# Patient Record
Sex: Female | Born: 1995 | Race: White | Hispanic: No | Marital: Single | Smoking: Former smoker
Health system: Southern US, Community
[De-identification: ages and names within clinical notes are randomized; demographics above are authoritative.]

## PROBLEM LIST (undated history)

## (undated) DIAGNOSIS — F32A Depression, unspecified: Secondary | ICD-10-CM

## (undated) DIAGNOSIS — R569 Unspecified convulsions: Secondary | ICD-10-CM

## (undated) DIAGNOSIS — F909 Attention-deficit hyperactivity disorder, unspecified type: Secondary | ICD-10-CM

## (undated) DIAGNOSIS — F39 Unspecified mood [affective] disorder: Secondary | ICD-10-CM

## (undated) DIAGNOSIS — F419 Anxiety disorder, unspecified: Secondary | ICD-10-CM

## (undated) DIAGNOSIS — G40909 Epilepsy, unspecified, not intractable, without status epilepticus: Secondary | ICD-10-CM

## (undated) DIAGNOSIS — G43909 Migraine, unspecified, not intractable, without status migrainosus: Secondary | ICD-10-CM

## (undated) DIAGNOSIS — F329 Major depressive disorder, single episode, unspecified: Secondary | ICD-10-CM

## (undated) HISTORY — PX: NO PAST SURGERIES: SHX2092

---
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
## 2015-04-19 ENCOUNTER — Other Ambulatory Visit: Payer: Self-pay | Admitting: Pediatrics

## 2015-04-19 DIAGNOSIS — R102 Pelvic and perineal pain: Secondary | ICD-10-CM

## 2015-04-20 ENCOUNTER — Ambulatory Visit
Admission: RE | Admit: 2015-04-20 | Discharge: 2015-04-20 | Disposition: A | Discharge status: Home / Self Care | Payer: Medicaid Other | Source: Ambulatory Visit | Attending: Pediatrics | Admitting: Pediatrics

## 2015-04-20 DIAGNOSIS — R102 Pelvic and perineal pain: Secondary | ICD-10-CM

## 2015-04-21 ENCOUNTER — Ambulatory Visit: Payer: Self-pay

## 2015-12-13 ENCOUNTER — Ambulatory Visit: Admission: EM | Admit: 2015-12-13 | Discharge: 2015-12-13 | Discharge status: Absent without leave | Payer: Self-pay

## 2018-11-19 ENCOUNTER — Other Ambulatory Visit: Payer: Self-pay

## 2018-11-19 ENCOUNTER — Emergency Department
Admission: EM | Admit: 2018-11-19 | Discharge: 2018-11-19 | Disposition: A | Discharge status: Home / Self Care | Payer: Managed Care, Other (non HMO) | Attending: Emergency Medicine | Admitting: Emergency Medicine

## 2018-11-19 ENCOUNTER — Encounter: Payer: Self-pay | Admitting: Emergency Medicine

## 2018-11-19 DIAGNOSIS — J029 Acute pharyngitis, unspecified: Secondary | ICD-10-CM | POA: Diagnosis present

## 2018-11-19 DIAGNOSIS — J039 Acute tonsillitis, unspecified: Secondary | ICD-10-CM | POA: Diagnosis not present

## 2018-11-19 HISTORY — DX: Attention-deficit hyperactivity disorder, unspecified type: F90.9

## 2018-11-19 HISTORY — DX: Unspecified convulsions: R56.9

## 2018-11-19 HISTORY — DX: Unspecified mood (affective) disorder: F39

## 2018-11-19 HISTORY — DX: Migraine, unspecified, not intractable, without status migrainosus: G43.909

## 2018-11-19 LAB — CBC WITH DIFFERENTIAL/PLATELET
Abs Immature Granulocytes: 0.17 10*3/uL — ABNORMAL HIGH (ref 0.00–0.07)
BASOS PCT: 0 %
Basophils Absolute: 0.1 10*3/uL (ref 0.0–0.1)
EOS ABS: 0.1 10*3/uL (ref 0.0–0.5)
Eosinophils Relative: 1 %
HCT: 41.8 % (ref 36.0–46.0)
Hemoglobin: 13.9 g/dL (ref 12.0–15.0)
Immature Granulocytes: 1 %
Lymphocytes Relative: 9 %
Lymphs Abs: 1.7 10*3/uL (ref 0.7–4.0)
MCH: 28.7 pg (ref 26.0–34.0)
MCHC: 33.3 g/dL (ref 30.0–36.0)
MCV: 86.2 fL (ref 80.0–100.0)
Monocytes Absolute: 1.6 10*3/uL — ABNORMAL HIGH (ref 0.1–1.0)
Monocytes Relative: 8 %
Neutro Abs: 15.6 10*3/uL — ABNORMAL HIGH (ref 1.7–7.7)
Neutrophils Relative %: 81 %
Platelets: 225 10*3/uL (ref 150–400)
RBC: 4.85 MIL/uL (ref 3.87–5.11)
RDW: 12.7 % (ref 11.5–15.5)
WBC: 19.4 10*3/uL — ABNORMAL HIGH (ref 4.0–10.5)
nRBC: 0 % (ref 0.0–0.2)

## 2018-11-19 LAB — BASIC METABOLIC PANEL
Anion gap: 9 (ref 5–15)
BUN: 9 mg/dL (ref 6–20)
CALCIUM: 9.1 mg/dL (ref 8.9–10.3)
CO2: 25 mmol/L (ref 22–32)
Chloride: 101 mmol/L (ref 98–111)
Creatinine, Ser: 0.75 mg/dL (ref 0.44–1.00)
GFR calc Af Amer: >60 mL/min (ref >60)
GFR calc non Af Amer: >60 mL/min (ref >60)
Glucose, Bld: 104 mg/dL — ABNORMAL HIGH (ref 70–99)
Potassium: 3.6 mmol/L (ref 3.5–5.1)
Sodium: 135 mmol/L (ref 135–145)

## 2018-11-19 LAB — GROUP A STREP BY PCR: Group A Strep by PCR: NOT DETECTED

## 2018-11-19 MED ORDER — PREDNISONE 10 MG PO TABS: ORAL_TABLET | ORAL | 0 refills | Status: DC

## 2018-11-19 MED ORDER — AMOXICILLIN-POT CLAVULANATE 500-125 MG PO TABS: 1.0000 | ORAL_TABLET | Freq: Three times a day (TID) | ORAL | 0 refills | Status: DC

## 2018-11-19 MED ORDER — DEXAMETHASONE SODIUM PHOSPHATE 4 MG/ML IJ SOLN
10.0000 mg | Freq: Once | INTRAMUSCULAR | Status: AC
  Administered 2018-11-19: 10 mg via INTRAVENOUS
  Filled 2018-11-19: qty 3

## 2018-11-19 MED ORDER — SODIUM CHLORIDE 0.9 % IV BOLUS
1000.0000 mL | Freq: Once | INTRAVENOUS | Status: AC
  Administered 2018-11-19: 1000 mL via INTRAVENOUS

## 2018-11-19 MED ORDER — SODIUM CHLORIDE 0.9 % IV SOLN
3.0000 g | Freq: Four times a day (QID) | INTRAVENOUS | Status: DC
  Administered 2018-11-19: 3 g via INTRAVENOUS
  Filled 2018-11-19: qty 3

## 2018-11-19 NOTE — ED Provider Notes (Signed)
[facility] Emergency Department Provider Note   ____________________________________________   None    (approximate)  I have reviewed the triage vital signs and the nursing notes.   HISTORY  Chief Complaint Sore Throat   HPI Sarah Gomez is a 22 y.o. female presents to the ED with complaint of sore throat.  Patient states that she was seen by her PCP yesterday at which time she was diagnosed with allergies.  Patient states that overnight her symptoms became worse and there was pain with swallowing.  She is uncertain of fever.  She has had decreased p.o. intake secondary to pain with swallowing however she is able to drink fluids and has been taking ibuprofen.  Currently she rates her pain as a 10/10.   Past Medical History:  Diagnosis Date  . ADHD   . Migraine   . Mood disorder (HCC)   . Seizures (HCC)     There are no active problems to display for this patient.   History reviewed. No pertinent surgical history.  Prior to Admission medications   Medication Sig Start Date End Date Taking? Authorizing Provider  amoxicillin-clavulanate (AUGMENTIN) 500-125 MG tablet Take 1 tablet (500 mg total) by mouth 3 (three) times daily. 11/19/18   Tommi RumpsSummers, Josphine Laffey L, PA-C  predniSONE (DELTASONE) 10 MG tablet Take 4 tablets x 2 days,  Then 3 tablets x 2 days 2 tablets x 2 days and  1 tablet x 2 days 11/19/18   Tommi RumpsSummers, Sherrick Araki L, PA-C    Allergies Patient has no known allergies.  No family history on file.  Social History Social History   Tobacco Use  . Smoking status: Never Smoker  . Smokeless tobacco: Never Used  Substance Use Topics  . Alcohol use: Not Currently  . Drug use: Not Currently    Review of Systems Constitutional: Positive subjective fever/chills Eyes: No visual changes. ENT: Positive sore throat.  Bilateral ear pain. Cardiovascular: Denies chest pain. Respiratory: Denies shortness of breath. Musculoskeletal: Negative for  back pain. Skin: Negative for rash. Neurological: Negative for headaches, focal weakness or numbness. ____________________________________________   PHYSICAL EXAM:  VITAL SIGNS: ED Triage Vitals  Enc Vitals Group     BP 11/19/18 0745 121/87     Pulse Rate 11/19/18 0745 (!) 104     Resp 11/19/18 0745 18     Temp 11/19/18 0745 (!) 100.7 F (38.2 C)     Temp Source 11/19/18 0745 Oral     SpO2 11/19/18 0745 97 %     Weight 11/19/18 0746 206 lb (93.4 kg)     Height 11/19/18 0746 5\' 5"  (1.651 m)     Head Circumference --      Peak Flow --      Pain Score 11/19/18 0751 10     Pain Loc --      Pain Edu? --      Excl. in GC? --    Constitutional: Alert and oriented. Well appearing and in no acute distress. Eyes: Conjunctivae are normal.  Head: Atraumatic. Nose: No congestion/rhinnorhea. Mouth/Throat: Mucous membranes are moist. EACs and TMs are clear bilaterally.  Tonsils are enlarged with exudate bilaterally.  Uvula is midline and patient is still able to maintain secretions.  She is able to speak but lets family member do most of the talking as talking increases her pain.  Voice is muffled.  Tender bilateral cervical adenopathy. Neck: No stridor.   Hematological/Lymphatic/Immunilogical: Tender bilateral cervical lymphadenopathy. Cardiovascular: Normal rate, regular rhythm.  Grossly normal heart sounds.  Good peripheral circulation. Respiratory: Normal respiratory effort.  No retractions. Lungs CTAB. Musculoskeletal: No lower extremity tenderness nor edema.  No joint effusions. Neurologic:  Normal speech and language. No gross focal neurologic deficits are appreciated. No gait instability. Skin:  Skin is warm, dry and intact. No rash noted. Psychiatric: Mood and affect are normal. Speech and behavior are normal.  ____________________________________________   LABS (all labs ordered are listed, but only abnormal results are displayed)  Labs Reviewed  CBC WITH  DIFFERENTIAL/PLATELET - Abnormal; Notable for the following components:      Result Value   WBC 19.4 (*)    Neutro Abs 15.6 (*)    Monocytes Absolute 1.6 (*)    Abs Immature Granulocytes 0.17 (*)    All other components within normal limits  BASIC METABOLIC PANEL - Abnormal; Notable for the following components:   Glucose, Bld 104 (*)    All other components within normal limits  GROUP A STREP BY PCR    PROCEDURES  Procedure(s) performed: None  Procedures  Critical Care performed: No  ____________________________________________   INITIAL IMPRESSION / ASSESSMENT AND PLAN / ED COURSE  As part of my medical decision making, I reviewed the following data within the electronic MEDICAL RECORD NUMBER Notes from prior ED visits and [place] Controlled Substance Database  Patient presents to the ED with complaint of sore throat for 2 days.  Patient states that she was seen by her PCP yesterday who diagnosed her with allergies.  She states she woke this morning with increased pain with swallowing.  On exam patient is able to maintain secretions and talks with a muffled voice.  Physical exam shows moderate exudate bilateral tonsils.  Uvula is still midline and patient is able to swallow without any difficulties.  She was given Unasyn 3 g IV along with Decadron 10 mg.  Patient improved.  She was started on Augmentin 500 mg 3 times daily and prednisone to continue for the next several days.  Patient is to follow-up with Dr. Jenne Campus who is on-call for ENT if any continued problems or worsening.  She also may return to the emergency department if any severe worsening of her symptoms.  She is encouraged to increase fluids when possible. ____________________________________________   FINAL CLINICAL IMPRESSION(S) / ED DIAGNOSES  Final diagnoses:  Exudative tonsillitis  Acute pharyngitis, unspecified etiology     ED Discharge Orders         Ordered    amoxicillin-clavulanate (AUGMENTIN) 500-125 MG  tablet  3 times daily     11/19/18 1021    predniSONE (DELTASONE) 10 MG tablet  Status:  Discontinued     11/19/18 1021    predniSONE (DELTASONE) 10 MG tablet     11/19/18 1028           Note:  This document was prepared using Dragon voice recognition software and may include unintentional dictation errors.    Tommi Rumps, PA-C 11/19/18 1410    Jene Every, MD 11/19/18 [phone]

## 2018-11-19 NOTE — ED Triage Notes (Signed)
Seen yesterday at PCP , for sore throat , dx with allergies. Over night worsening symptoms painful swallowing.

## 2018-11-19 NOTE — ED Notes (Signed)
Pt also not able to eat since Sunday and not drinking well either r/t the pain.

## 2018-11-19 NOTE — Discharge Instructions (Signed)
Follow up with your doctor if any continued problems.  You may also follow-up with [facility] if any worsening of your symptoms. Begin taking antibiotics tonight. prednisone starting tomorrow as directed. You may take Tylenol as needed for pain.  Increase fluids.

## 2019-04-30 ENCOUNTER — Other Ambulatory Visit (HOSPITAL_COMMUNITY)
Admission: RE | Admit: 2019-04-30 | Discharge: 2019-04-30 | Disposition: A | Discharge status: Home / Self Care | Payer: BC Managed Care – PPO | Source: Ambulatory Visit | Attending: Maternal Newborn | Admitting: Maternal Newborn

## 2019-04-30 ENCOUNTER — Encounter: Payer: Managed Care, Other (non HMO) | Admitting: Maternal Newborn

## 2019-04-30 ENCOUNTER — Other Ambulatory Visit: Payer: Self-pay

## 2019-04-30 ENCOUNTER — Ambulatory Visit (INDEPENDENT_AMBULATORY_CARE_PROVIDER_SITE_OTHER): Payer: Managed Care, Other (non HMO) | Admitting: Maternal Newborn

## 2019-04-30 ENCOUNTER — Encounter: Payer: Self-pay | Admitting: Maternal Newborn

## 2019-04-30 VITALS — BP 104/70 | Wt 198.8 lb

## 2019-04-30 DIAGNOSIS — G40909 Epilepsy, unspecified, not intractable, without status epilepticus: Secondary | ICD-10-CM

## 2019-04-30 DIAGNOSIS — Z3401 Encounter for supervision of normal first pregnancy, first trimester: Secondary | ICD-10-CM

## 2019-04-30 DIAGNOSIS — O99351 Diseases of the nervous system complicating pregnancy, first trimester: Secondary | ICD-10-CM

## 2019-04-30 DIAGNOSIS — Z369 Encounter for antenatal screening, unspecified: Secondary | ICD-10-CM

## 2019-04-30 DIAGNOSIS — Z6833 Body mass index (BMI) 33.0-33.9, adult: Secondary | ICD-10-CM

## 2019-04-30 DIAGNOSIS — Z113 Encounter for screening for infections with a predominantly sexual mode of transmission: Secondary | ICD-10-CM | POA: Insufficient documentation

## 2019-04-30 NOTE — Progress Notes (Signed)
NOB- lower pelvic area cramping for the last week, positive UPT at [facility]

## 2019-04-30 NOTE — Patient Instructions (Signed)
First Trimester of Pregnancy  The first trimester of pregnancy is from week 1 until the end of week 13 (months 1 through 3). A week after a sperm fertilizes an egg, the egg will implant on the wall of the uterus. This embryo will begin to develop into a baby. Genes from you and your partner will form the baby. The female genes will determine whether the baby will be a boy or a girl. At 6-8 weeks, the eyes and face will be formed, and the heartbeat can be seen on ultrasound. At the end of 12 weeks, all the baby's organs will be formed.  Now that you are pregnant, you will want to do everything you can to have a healthy baby. Two of the most important things are to get good prenatal care and to follow your health care provider's instructions. Prenatal care is all the medical care you receive before the baby's birth. This care will help prevent, find, and treat any problems during the pregnancy and childbirth.  Body changes during your first trimester  Your body goes through many changes during pregnancy. The changes vary from woman to woman.   You may gain or lose a couple of pounds at first.   You may feel sick to your stomach (nauseous) and you may throw up (vomit). If the vomiting is uncontrollable, call your health care provider.   You may tire easily.   You may develop headaches that can be relieved by medicines. All medicines should be approved by your health care provider.   You may urinate more often. Painful urination may mean you have a bladder infection.   You may develop heartburn as a result of your pregnancy.   You may develop constipation because certain hormones are causing the muscles that push stool through your intestines to slow down.   You may develop hemorrhoids or swollen veins (varicose veins).   Your breasts may begin to grow larger and become tender. Your nipples may stick out more, and the tissue that surrounds them (areola) may become darker.   Your gums may bleed and may be  sensitive to brushing and flossing.   Dark spots or blotches (chloasma, mask of pregnancy) may develop on your face. This will likely fade after the baby is born.   Your menstrual periods will stop.   You may have a loss of appetite.   You may develop cravings for certain kinds of food.   You may have changes in your emotions from day to day, such as being excited to be pregnant or being concerned that something may go wrong with the pregnancy and baby.   You may have more vivid and strange dreams.   You may have changes in your hair. These can include thickening of your hair, rapid growth, and changes in texture. Some women also have hair loss during or after pregnancy, or hair that feels dry or thin. Your hair will most likely return to normal after your baby is born.  What to expect at prenatal visits  During a routine prenatal visit:   You will be weighed to make sure you and the baby are growing normally.   Your blood pressure will be taken.   Your abdomen will be measured to track your baby's growth.   The fetal heartbeat will be listened to between weeks 10 and 14 of your pregnancy.   Test results from any previous visits will be discussed.  Your health care provider may ask you:     How you are feeling.   If you are feeling the baby move.   If you have had any abnormal symptoms, such as leaking fluid, bleeding, severe headaches, or abdominal cramping.   If you are using any tobacco products, including cigarettes, chewing tobacco, and electronic cigarettes.   If you have any questions.  Other tests that may be performed during your first trimester include:   Blood tests to find your blood type and to check for the presence of any previous infections. The tests will also be used to check for low iron levels (anemia) and protein on red blood cells (Rh antibodies). Depending on your risk factors, or if you previously had diabetes during pregnancy, you may have tests to check for high blood sugar  that affects pregnant women (gestational diabetes).   Urine tests to check for infections, diabetes, or protein in the urine.   An ultrasound to confirm the proper growth and development of the baby.   Fetal screens for spinal cord problems (spina bifida) and Down syndrome.   HIV (human immunodeficiency virus) testing. Routine prenatal testing includes screening for HIV, unless you choose not to have this test.   You may need other tests to make sure you and the baby are doing well.  Follow these instructions at home:  Medicines   Follow your health care provider's instructions regarding medicine use. Specific medicines may be either safe or unsafe to take during pregnancy.   Take a prenatal vitamin that contains at least 600 micrograms (mcg) of folic acid.   If you develop constipation, try taking a stool softener if your health care provider approves.  Eating and drinking     Eat a balanced diet that includes fresh fruits and vegetables, whole grains, good sources of protein such as meat, eggs, or tofu, and low-fat dairy. Your health care provider will help you determine the amount of weight gain that is right for you.   Avoid raw meat and uncooked cheese. These carry germs that can cause birth defects in the baby.   Eating four or five small meals rather than three large meals a day may help relieve nausea and vomiting. If you start to feel nauseous, eating a few soda crackers can be helpful. Drinking liquids between meals, instead of during meals, also seems to help ease nausea and vomiting.   Limit foods that are high in fat and processed sugars, such as fried and sweet foods.   To prevent constipation:  ? Eat foods that are high in fiber, such as fresh fruits and vegetables, whole grains, and beans.  ? Drink enough fluid to keep your urine clear or pale yellow.  Activity   Exercise only as directed by your health care provider. Most women can continue their usual exercise routine during  pregnancy. Try to exercise for 30 minutes at least 5 days a week. Exercising will help you:  ? Control your weight.  ? Stay in shape.  ? Be prepared for labor and delivery.   Experiencing pain or cramping in the lower abdomen or lower back is a good sign that you should stop exercising. Check with your health care provider before continuing with normal exercises.   Try to avoid standing for long periods of time. Move your legs often if you must stand in one place for a long time.   Avoid heavy lifting.   Wear low-heeled shoes and practice good posture.   You may continue to have sex unless your health care   provider tells you not to.  Relieving pain and discomfort   Wear a good support bra to relieve breast tenderness.   Take warm sitz baths to soothe any pain or discomfort caused by hemorrhoids. Use hemorrhoid cream if your health care provider approves.   Rest with your legs elevated if you have leg cramps or low back pain.   If you develop varicose veins in your legs, wear support hose. Elevate your feet for 15 minutes, 3-4 times a day. Limit salt in your diet.  Prenatal care   Schedule your prenatal visits by the twelfth week of pregnancy. They are usually scheduled monthly at first, then more often in the last 2 months before delivery.   Write down your questions. Take them to your prenatal visits.   Keep all your prenatal visits as told by your health care provider. This is important.  Safety   Wear your seat belt at all times when driving.   Make a list of emergency phone numbers, including numbers for family, friends, the hospital, and police and fire departments.  General instructions   Ask your health care provider for a referral to a local prenatal education class. Begin classes no later than the beginning of month 6 of your pregnancy.   Ask for help if you have counseling or nutritional needs during pregnancy. Your health care provider can offer advice or refer you to specialists for help  with various needs.   Do not use hot tubs, steam rooms, or saunas.   Do not douche or use tampons or scented sanitary pads.   Do not cross your legs for long periods of time.   Avoid cat litter boxes and soil used by cats. These carry germs that can cause birth defects in the baby and possibly loss of the fetus by miscarriage or stillbirth.   Avoid all smoking, herbs, alcohol, and medicines not prescribed by your health care provider. Chemicals in these products affect the formation and growth of the baby.   Do not use any products that contain nicotine or tobacco, such as cigarettes and e-cigarettes. If you need help quitting, ask your health care provider. You may receive counseling support and other resources to help you quit.   Schedule a dentist appointment. At home, brush your teeth with a soft toothbrush and be gentle when you floss.  Contact a health care provider if:   You have dizziness.   You have mild pelvic cramps, pelvic pressure, or nagging pain in the abdominal area.   You have persistent nausea, vomiting, or diarrhea.   You have a bad smelling vaginal discharge.   You have pain when you urinate.   You notice increased swelling in your face, hands, legs, or ankles.   You are exposed to fifth disease or chickenpox.   You are exposed to German measles (rubella) and have never had it.  Get help right away if:   You have a fever.   You are leaking fluid from your vagina.   You have spotting or bleeding from your vagina.   You have severe abdominal cramping or pain.   You have rapid weight gain or loss.   You vomit blood or material that looks like coffee grounds.   You develop a severe headache.   You have shortness of breath.   You have any kind of trauma, such as from a fall or a car accident.  Summary   The first trimester of pregnancy is from week 1 until   the end of week 13 (months 1 through 3).   Your body goes through many changes during pregnancy. The changes vary from  woman to woman.   You will have routine prenatal visits. During those visits, your health care provider will examine you, discuss any test results you may have, and talk with you about how you are feeling.  This information is not intended to replace advice given to you by your health care provider. Make sure you discuss any questions you have with your health care provider.  Document Released: 11/14/2001 Document Revised: 11/01/2016 Document Reviewed: 11/01/2016  Elsevier Interactive Patient Education  2019 [employer]

## 2019-04-30 NOTE — Progress Notes (Signed)
04/30/2019   Chief Complaint: Desires prenatal care.  Transfer of Care Patient: no  History of Present Illness: [name] is a 23 y.o. G1P0 at [redacted]w[redacted]d based on Patient's last menstrual period on 02/12/2019 (exact date), with an Estimated Date of Delivery: 11/19/2019, with the above CC.   Her periods were: regular periods every month lasting 3-5 days She has Positive signs or symptoms of nausea/vomiting of pregnancy. She has Negative signs or symptoms of miscarriage or preterm labor She identifies Negative Zika risk factors for her and her partner On any different medications around the time she conceived/early pregnancy: No  History of varicella: No   Review of Systems  Constitutional: Negative.   HENT: Negative.   Eyes: Negative.   Respiratory: Negative for shortness of breath and wheezing.   Cardiovascular: Negative for chest pain and palpitations.  Gastrointestinal: Positive for nausea and vomiting. Negative for abdominal pain.  Genitourinary: Negative.   Musculoskeletal: Negative.   Skin: Negative.   Neurological: Positive for seizures and headaches.       Migraine without aura, history of seizures in childhood, last one was years ago  Endo/Heme/Allergies: Negative.   Psychiatric/Behavioral: Positive for depression. The patient is nervous/anxious.    Review of systems was otherwise negative, except as stated in the above HPI.  OBGYN History: As per HPI. OB History  Gravida Para Term Preterm AB Living  1            SAB TAB Ectopic Multiple Live Births               # Outcome Date GA Lbr Len/2nd Weight Sex Delivery Anes PTL Lv  1 Current             Any issues with any prior pregnancies: not applicable Any prior children are healthy, doing well, without any problems or issues: not applicable History of pap smears: Yes. Last pap smear was at the begnning of last year at [facility], asked to provide record of Pap. History of STIs: No   Past Medical History: Past Medical  History:  Diagnosis Date  . ADHD   . Migraine   . Mood disorder (HCC)   . Seizures (HCC)     Past Surgical History: History reviewed. No pertinent surgical history.  Family History:  Negative/unremarkable except as detailed in HPI. She denies any female cancers, bleeding or blood clotting disorders.  FOB's father had a heart murmur, uncertain if present from birth. She denies any other history of intellectual disability, birth defects or genetic disorders in her or the FOB's history  Social History:  Social History   Socioeconomic History  . Marital status: Single    Spouse name: Not on file  . Number of children: Not on file  . Years of education: Not on file  . Highest education level: Not on file  Occupational History  . Not on file  Social Needs  . Financial resource strain: Not on file  . Food insecurity:    Worry: Not on file    Inability: Not on file  . Transportation needs:    Medical: Not on file    Non-medical: Not on file  Tobacco Use  . Smoking status: Never Smoker  . Smokeless tobacco: Never Used  Substance and Sexual Activity  . Alcohol use: Not Currently  . Drug use: Not Currently  . Sexual activity: Yes    Partners: Male    Birth control/protection: None  Lifestyle  . Physical activity:    Days  per week: Not on file    Minutes per session: Not on file  . Stress: Not on file  Relationships  . Social connections:    Talks on phone: Not on file    Gets together: Not on file    Attends religious service: Not on file    Active member of club or organization: Not on file    Attends meetings of clubs or organizations: Not on file    Relationship status: Not on file  . Intimate partner violence:    Fear of current or ex partner: Not on file    Emotionally abused: Not on file    Physically abused: Not on file    Forced sexual activity: Not on file  Other Topics Concern  . Not on file  Social History Narrative  . Not on file   Any cats in the  household: no Domestic violence screening is negative.  Allergy: No Known Allergies  Current Outpatient Medications: No current outpatient medications on file.   Physical Exam:   BP 104/70   Wt 198 lb 12.8 oz (90.2 kg)   LMP 02/12/2019 (Exact Date)   BMI 33.08 kg/m  Body mass index is 33.08 kg/m. Constitutional: Well nourished, well developed female in no acute distress.  Neck:  Supple, normal appearance, and no thyromegaly  Cardiovascular: S1, S2 normal, no murmur, rub or gallop, regular rate and rhythm Respiratory:  Clear to auscultation bilaterally. Normal respiratory effort Abdomen: Soft, no masses, hernias; diffusely non tender to palpation, non distended Breasts: patient declines to have breast exam. Neuro/Psych:  Normal mood and affect.  Skin:  Warm and dry.  Lymphatic:  No inguinal lymphadenopathy.   Pelvic exam: is not limited by body habitus External genitalia, Bartholin's glands, Urethra, Skene's glands: within normal limits Vagina: within normal limits and with no blood in the vault  Cervix: normal appearing cervix without discharge or lesions, closed/long/high Uterus:  enlarged, normal contour Adnexa:  no mass, fullness, tenderness  Assessment: [name] is a 23 y.o. G1P0 at 5557w0d based on Patient's last menstrual period on 02/12/2019 (exact date), with an Estimated Date of Delivery: 11/19/2019, presenting for prenatal care.  Plan:  1) Avoid alcoholic beverages. 2) Patient encouraged not to smoke.  3) Discontinue the use of all non-medicinal drugs and chemicals.  4) Take prenatal vitamins daily.  5) Seatbelt use advised 6) Nutrition, food safety (fish, cheese advisories, and high nitrite foods) and exercise discussed. 7) Hospital and [facility] discussed  8) Patient is asked about travel to areas at risk for the Zika virus, and counseled to avoid travel and exposure to mosquitoes or sexual partners who may have themselves been exposed  to the virus. Testing is discussed, and will be ordered as appropriate.  9) [facility] advised 10) Genetic Screening, such as with 1st Trimester Screening, cell free fetal DNA, AFP testing, and Ultrasound, as well as with amniocentesis and CVS as appropriate, is discussed with patient. She is undecided about genetic testing this pregnancy. 11) Dating scan ordered. 12) Early GTT and NOB labs next visit, ordered today. 13) Need records of Pap at [facility]. 14) Referral to Neurology for history of seizures. 15) Patient has Rx for duloxetine from another provider, has not started taking the medication. Discussed treating with Zoloft in pregnancy, and she wants to talk to her provider about using that instead.  Problem list reviewed and updated.  Marcelyn BruinsJacelyn Chester Sibert, CNM [facility], [facility] 04/30/2019  4:11 PM

## 2019-05-01 ENCOUNTER — Ambulatory Visit (INDEPENDENT_AMBULATORY_CARE_PROVIDER_SITE_OTHER): Payer: BC Managed Care – PPO

## 2019-05-01 ENCOUNTER — Encounter: Payer: Self-pay | Admitting: Obstetrics and Gynecology

## 2019-05-01 ENCOUNTER — Other Ambulatory Visit: Payer: Managed Care, Other (non HMO)

## 2019-05-01 ENCOUNTER — Ambulatory Visit (INDEPENDENT_AMBULATORY_CARE_PROVIDER_SITE_OTHER): Payer: Managed Care, Other (non HMO) | Admitting: Obstetrics and Gynecology

## 2019-05-01 ENCOUNTER — Other Ambulatory Visit: Payer: Self-pay

## 2019-05-01 DIAGNOSIS — O3481 Maternal care for other abnormalities of pelvic organs, first trimester: Secondary | ICD-10-CM

## 2019-05-01 DIAGNOSIS — Z3401 Encounter for supervision of normal first pregnancy, first trimester: Secondary | ICD-10-CM

## 2019-05-01 DIAGNOSIS — O99210 Obesity complicating pregnancy, unspecified trimester: Secondary | ICD-10-CM | POA: Insufficient documentation

## 2019-05-01 DIAGNOSIS — Z6834 Body mass index (BMI) 34.0-34.9, adult: Secondary | ICD-10-CM | POA: Insufficient documentation

## 2019-05-01 DIAGNOSIS — N8311 Corpus luteum cyst of right ovary: Secondary | ICD-10-CM

## 2019-05-01 DIAGNOSIS — Z6833 Body mass index (BMI) 33.0-33.9, adult: Secondary | ICD-10-CM

## 2019-05-01 DIAGNOSIS — G40909 Epilepsy, unspecified, not intractable, without status epilepticus: Secondary | ICD-10-CM | POA: Insufficient documentation

## 2019-05-01 DIAGNOSIS — O0990 Supervision of high risk pregnancy, unspecified, unspecified trimester: Secondary | ICD-10-CM

## 2019-05-01 DIAGNOSIS — O99211 Obesity complicating pregnancy, first trimester: Secondary | ICD-10-CM

## 2019-05-01 DIAGNOSIS — Z3A01 Less than 8 weeks gestation of pregnancy: Secondary | ICD-10-CM | POA: Diagnosis not present

## 2019-05-01 DIAGNOSIS — Z369 Encounter for antenatal screening, unspecified: Secondary | ICD-10-CM

## 2019-05-01 DIAGNOSIS — O99351 Diseases of the nervous system complicating pregnancy, first trimester: Secondary | ICD-10-CM

## 2019-05-01 DIAGNOSIS — O99350 Diseases of the nervous system complicating pregnancy, unspecified trimester: Secondary | ICD-10-CM | POA: Insufficient documentation

## 2019-05-01 LAB — OB RESULTS CONSOLE VARICELLA ZOSTER ANTIBODY, IGG: Varicella: NON-IMMUNE/NOT IMMUNE

## 2019-05-01 NOTE — Progress Notes (Signed)
Routine Prenatal Care Visit  Subjective  Sarah Gomez is a 23 y.o. G1P0 at [redacted]w[redacted]d being seen today for ongoing prenatal care.  She is currently monitored for the following issues for this high-risk pregnancy and has Supervision of high risk pregnancy, antepartum; Obesity affecting pregnancy; BMI 34.0-34.9,adult; and Seizure disorder during pregnancy (HCC) on their problem list.  ----------------------------------------------------------------------------------- Patient reports no complaints.    . Vag. Bleeding: None.   . Denies leaking of fluid.  Dating u/s changes EDD today. No issues Unsure of last pap smear (is checking with health department to see if already done). ----------------------------------------------------------------------------------- The following portions of the patient's history were reviewed and updated as appropriate: allergies, current medications, past family history, past medical history, past social history, past surgical history and problem list. Problem list updated.   Objective  Blood pressure 118/74, weight 198 lb (89.8 kg), last menstrual period 02/12/2019. Pregravid weight 205 lb (93 kg) Total Weight Gain -7 lb (-3.175 kg) Urinalysis: Urine Protein    Urine Glucose    Fetal Status: Fetal Heart Rate (bpm): present-normal         General:  Alert, oriented and cooperative. Patient is in no acute distress.  Skin: Skin is warm and dry. No rash noted.   Cardiovascular: Normal heart rate noted  Respiratory: Normal respiratory effort, no problems with respiration noted  Abdomen: Soft, gravid, appropriate for gestational age. Pain/Pressure: Absent     Pelvic:  Cervical exam deferred        Extremities: Normal range of motion.     Mental Status: Normal mood and affect. Normal behavior. Normal judgment and thought content.   Imaging Results US Ob Comp Less 14 Wks  Result Date: 05/01/2019 Patient Name: Sarah Gomez DOB: [birth year] MRN: [id number] ULTRASOUND  REPORT Location: [facility] Date of Service: 05/01/2019 Indications:dating Findings: [name] intrauterine pregnancy is visualized with a CRL consistent with [redacted]w[redacted]d gestation, giving an (U/S) EDD of 12/14/19. The (U/S) EDD is not consistent with the clinically established EDD of 11/19/19. FHR: 138 BPM CRL measurement: 13.1 mm Yolk sac is visualized and appears normal and early anatomy is normal. Amnion: visualized and appears normal Subchorionic hemorrhage measuring 2.6 x 2.3 x 0.9cm Right Ovary is normal in appearance. Left Ovary is normal appearance. Corpus luteal cyst:  Right ovary Survey of the adnexa demonstrates no adnexal masses. There is no free peritoneal fluid in the cul de sac. Impression: 1. [redacted]w[redacted]d Viable Singleton Intrauterine pregnancy by U/S. 2. (U/S) EDD is not consistent with Clinically established EDD. [place] EDD of 12/14/19. 3. Subchorionic hemorrhage 2.6cm. Recommendations: Follow up subchorionic hemorrhage as clinically indicated. Darlina Guys, RT There is a viable singleton gestation.  Detailed evaluation of the fetal anatomy is precluded by early gestational age.  It must be noted that a normal ultrasound particular at this early gestational age is unable to rule out fetal aneuploidy, risk of first trimester miscarriage, or anatomic birth defects. [name], MD, [name], [facility] 05/01/2019 9:35 AM      Assessment   22 y.o. G1P0 at [redacted]w[redacted]d by  12/14/2019, by Ultrasound presenting for routine prenatal visit  Plan   FIRST Problems (from 04/30/19 to present)    Problem Noted Resolved   Supervision of high risk pregnancy, antepartum 05/01/2019 by Conard Novak, MD No   Obesity affecting pregnancy 05/01/2019 by Conard Novak, MD No   BMI 34.0-34.9,adult 05/01/2019 by Conard Novak, MD No   Seizure disorder during pregnancy [facility])  05/01/2019 by Conard NovakJackson, Jeree Delcid D, MD No   Overview Signed 05/01/2019  9:59 AM by Conard NovakJackson, Samaiya Awadallah D, MD    Not on  meds. Has been many years since her last one.          Preterm labor symptoms and general obstetric precautions including but not limited to vaginal bleeding, contractions, leaking of fluid and fetal movement were reviewed in detail with the patient. Please refer to After Visit Summary for other counseling recommendations.   Return in about 4 weeks (around 05/29/2019) for Routine Prenatal Appointment.  Thomasene MohairStephen Tynasia Mccaul, MD, [facility]inda FrederickFACOG [facility], [facility] 05/01/2019 9:59 AM

## 2019-05-03 LAB — RPR+RH+ABO+RUB AB+AB SCR+CB...
Antibody Screen: NEGATIVE
HIV Screen 4th Generation wRfx: NONREACTIVE
Hematocrit: 42.3 % (ref 34.0–46.6)
Hemoglobin: 14 g/dL (ref 11.1–15.9)
Hepatitis B Surface Ag: NEGATIVE
MCH: 28.9 pg (ref 26.6–33.0)
MCHC: 33.1 g/dL (ref 31.5–35.7)
MCV: 87 fL (ref 79–97)
Platelets: 268 10*3/uL (ref 150–450)
RBC: 4.84 x10E6/uL (ref 3.77–5.28)
RDW: 14.1 % (ref 11.7–15.4)
RPR Ser Ql: NONREACTIVE
Rh Factor: POSITIVE
Rubella Antibodies, IGG: 1.45 index (ref >0.99)
Varicella zoster IgG: <135 index — ABNORMAL LOW (ref >165)
WBC: 6.5 10*3/uL (ref 3.4–10.8)

## 2019-05-03 LAB — URINE CULTURE

## 2019-05-03 LAB — GLUCOSE, 1 HOUR GESTATIONAL: Gestational Diabetes Screen: 47 mg/dL — ABNORMAL LOW (ref 65–139)

## 2019-05-05 LAB — URINE DRUG PANEL 7
Amphetamines, Urine: NEGATIVE ng/mL
Barbiturate Quant, Ur: NEGATIVE ng/mL
Benzodiazepine Quant, Ur: NEGATIVE ng/mL
Cannabinoid Quant, Ur: POSITIVE — AB
Cocaine (Metab.): NEGATIVE ng/mL
Opiate Quant, Ur: NEGATIVE ng/mL
PCP Quant, Ur: NEGATIVE ng/mL

## 2019-05-05 LAB — CERVICOVAGINAL ANCILLARY ONLY
Chlamydia: POSITIVE — AB
Neisseria Gonorrhea: NEGATIVE

## 2019-05-08 ENCOUNTER — Telehealth: Payer: Self-pay | Admitting: Obstetrics and Gynecology

## 2019-05-08 ENCOUNTER — Other Ambulatory Visit: Payer: Self-pay | Admitting: Maternal Newborn

## 2019-05-08 DIAGNOSIS — A749 Chlamydial infection, unspecified: Secondary | ICD-10-CM

## 2019-05-08 MED ORDER — AZITHROMYCIN 500 MG PO TABS: 1000.0000 mg | ORAL_TABLET | Freq: Once | ORAL | 0 refills | Status: AC

## 2019-05-08 NOTE — Telephone Encounter (Signed)
Patient returning call to go over lab results.  Best cb is [phone].

## 2019-05-08 NOTE — Progress Notes (Signed)
Rx sent for azithromycin; discussed necessity of treatment for partner.

## 2019-05-09 ENCOUNTER — Telehealth: Payer: Self-pay

## 2019-05-09 NOTE — Telephone Encounter (Signed)
Pt called front desk stating rx was not at pharmacy.  Needs it to go to [facility] in [place].  Azithromycin called in on pharm vm.

## 2019-05-12 NOTE — Telephone Encounter (Signed)
Pt called after hour nurse 05/09/19 at 5:42pm stating she was told rx would be called in but pharm doesn't have rx.  I called pharm this am; spoke c Scott who states pt p/u rx at 6:42pm on 05/09/19.

## 2019-05-13 DIAGNOSIS — F39 Unspecified mood [affective] disorder: Secondary | ICD-10-CM | POA: Insufficient documentation

## 2019-05-15 ENCOUNTER — Other Ambulatory Visit: Payer: Self-pay

## 2019-05-15 ENCOUNTER — Telehealth (INDEPENDENT_AMBULATORY_CARE_PROVIDER_SITE_OTHER): Payer: BC Managed Care – PPO | Admitting: Neurology

## 2019-05-15 VITALS — Ht 65.0 in | Wt 201.0 lb

## 2019-05-15 DIAGNOSIS — Z87898 Personal history of other specified conditions: Secondary | ICD-10-CM

## 2019-05-15 DIAGNOSIS — G43009 Migraine without aura, not intractable, without status migrainosus: Secondary | ICD-10-CM | POA: Diagnosis not present

## 2019-05-15 NOTE — Progress Notes (Signed)
Virtual Visit via Video Note The purpose of this virtual visit is to provide medical care while limiting exposure to the novel coronavirus.    Consent was obtained for video visit:  Yes.   Answered questions that patient had about telehealth interaction:  Yes.   I discussed the limitations, risks, security and privacy concerns of performing an evaluation and management service by telemedicine. I also discussed with the patient that there may be a patient responsible charge related to this service. The patient expressed understanding and agreed to proceed.  Pt location: Home Physician Location: office Name of referring provider:  [facility] connected with Sarah Gomez at patients initiation/request on 05/15/2019 at  9:00 AM EDT by video enabled telemedicine application and verified that I am speaking with the correct person using two identifiers. Pt MRN:  [id number] Pt DOB:  September 14, 1996 Video Participants:  Sarah Gomez   History of Present Illness:  This is a pleasant 22 year old G1P0 right-handed woman, currently [redacted] weeks pregnant, presenting to establish care. She had a history of seizures in childhood and has migraines. Records from [facility] were reviewed. She cannot provide much detail about the seizures because she does not remember them when they occur. She recalls the first episode occurred in elementary school where she was told she blanked out and stared into space, she came to with her teacher in front of her. 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 At one point she was on Keppra. She had a normal MRI brain in 2005 and multiple EEGs from 2005 to 2007 showing focal central sharp waves more prominent in sleep and more prominent on the left side. She had an EEG in 2008 was consistent with Benign Epilepsy with Centrotemporal  spikes, a horizontal dipole was present as well as phase reversing spikes around left C3 and P3 electrodes. It was noted that there was no clinical seizure activity and that antiepileptic medication was not recommended due to benign nature of EEG. Her mother continued to report cognitive issues and she was started on Lamictal in 2010. At some point after 2012 she stopped the Lamictal since she was not having seizures. She was treated with amitriptyline and Topamax for migraines, last visit with Pediatric Neurology was in 2015.   She reports her mother told her the last seizure was at age 72. She and her boyfriend deny any episodes of staring/unresponsiveness, gaps in time, olfactory/gustatory hallucinations. Her boyfriend mentioned she jerks in her sleep, none during wakefulness. Lately she was been more clumsy dropping things. She is more forgetful, she does not remember what happened the day prior. She lives with her boyfriend and his family, she has forgotten to take her prenatal vitamins and has gotten lost driving. She would have her mind set where she is going and ends up forgetting her destination. 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 Bills are on autopay so she does not forget them. She reports stopping Topamax a year ago, it was helping her migraines but she wanted to see if she could go without medication. She went a couple of months headache-free off medication, but migraines started back up. She had a bad migraine yesterday that lasted 6-7 hours with photophobia. She has been tired and drowsy. She has rare lightheadedness. She has neck and back pain constipation. No diplopia,  dysarthria/dysphagia, focal numbness/tingling/weakness, no falls. She has significant anxiety and is seeing a provider for anxiety today, she wanted to cry this morning. Her mother and 2 sisters have migraines. She had a normal birth and early development.  There is no history of febrile convulsions,  CNS infections such as meningitis/encephalitis, significant traumatic brain injury, neurosurgical procedures, or family history of seizures.   PAST MEDICAL HISTORY: Past Medical History:  Diagnosis Date   ADHD    Migraine    Mood disorder (HCC)    Seizures (HCC)     PAST SURGICAL HISTORY: No past surgical history on file.  MEDICATIONS: Current Outpatient Medications on File Prior to Visit  Medication Sig Dispense Refill   prenatal vitamin w/FE, FA (PRENATAL 1 + 1) 27-1 MG TABS tablet Take 1 tablet by mouth daily at 12 noon.     No current facility-administered medications on file prior to visit.     ALLERGIES: No Known Allergies  FAMILY HISTORY: Family History  Problem Relation Age of Onset   Multiple sclerosis Mother    Hypertension Father    Heart Problems Sister     Observations/Objective:   Vitals:   05/15/19 0901  Weight: 201 lb (91.2 kg)  Height: 5\' 5"  (1.651 m)   GEN:  The patient appears stated age and is in NAD.  Neurological examination: Patient is awake, alert, oriented x 3. No aphasia or dysarthria. Intact fluency and comprehension. Remote and recent memory intact. Able to name and repeat. Cranial nerves: Extraocular movements intact with no nystagmus. No facial asymmetry. Motor: moves all extremities symmetrically, at least anti-gravity x 4. No incoordination on finger to nose testing. Gait: narrow-based and steady, able to tandem walk adequately. Negative Romberg test.  Assessment and Plan: This is a pleasant 23 year old G1P0 right-handed woman, currently [redacted] weeks pregnant, presenting to establish care.  She has a history of seizures when younger, diagnosed as Benign Rolandic Epilepsy with prior EEGs showing characteristic EEG findings, seizure-free since age 23 off seizure medication. She has migraines and stopped Topamax a year ago. She is having more migraines but is currently pregnant, would avoid Topamax. She is reporting more forgetfulness and  would like to be further evaluated by her OB for her neurological issues now that she is pregnant. We discussed doing a 1-hour EEG but would hold off on seizure medication for now since she is not having any clinical seizures. We discussed [place] driving laws to stop driving after a seizure until 6 months seizure-free. Follow-up in 6 months, she knows to call for any changes.   Follow Up Instructions:   -I discussed the assessment and treatment plan with the patient. The patient was provided an opportunity to ask questions and all were answered. The patient agreed with the plan and demonstrated an understanding of the instructions.   The patient was advised to call back or seek an in-person evaluation if the symptoms worsen or if the condition fails to improve as anticipated.     Van ClinesKaren M Edee Nifong, MD

## 2019-05-15 NOTE — Telephone Encounter (Signed)
Called patient in follow up from her call. She states that she now has her information and has no questions.

## 2019-05-21 ENCOUNTER — Other Ambulatory Visit: Payer: Self-pay

## 2019-05-21 DIAGNOSIS — Z87898 Personal history of other specified conditions: Secondary | ICD-10-CM

## 2019-05-21 DIAGNOSIS — G43009 Migraine without aura, not intractable, without status migrainosus: Secondary | ICD-10-CM

## 2019-05-26 ENCOUNTER — Telehealth: Payer: Self-pay

## 2019-05-26 NOTE — Telephone Encounter (Signed)
Pt calling c/o keeps getting sick; thinks it's acid reflux; can she be seen before the 25th?  [phone]  [facility]

## 2019-05-26 NOTE — Telephone Encounter (Signed)
Adv may try Tums, rolaids, nexium, protonix; avoid fried, fatty, spicy foods; eat smaller meals and may need to graze; elevate HOB 4-6in; drink 1/4 cup milk before lying down; lie on right side as stomach empties out on right side.  Pt to try these measures and d/w provider at appt on 25th.

## 2019-05-29 ENCOUNTER — Ambulatory Visit (INDEPENDENT_AMBULATORY_CARE_PROVIDER_SITE_OTHER): Payer: Managed Care, Other (non HMO) | Admitting: Advanced Practice Midwife

## 2019-05-29 ENCOUNTER — Other Ambulatory Visit: Payer: Self-pay

## 2019-05-29 ENCOUNTER — Encounter: Payer: Self-pay | Admitting: Advanced Practice Midwife

## 2019-05-29 VITALS — BP 108/70 | Wt 196.0 lb

## 2019-05-29 DIAGNOSIS — O219 Vomiting of pregnancy, unspecified: Secondary | ICD-10-CM

## 2019-05-29 DIAGNOSIS — O0991 Supervision of high risk pregnancy, unspecified, first trimester: Secondary | ICD-10-CM

## 2019-05-29 DIAGNOSIS — Z3A11 11 weeks gestation of pregnancy: Secondary | ICD-10-CM

## 2019-05-29 DIAGNOSIS — O0990 Supervision of high risk pregnancy, unspecified, unspecified trimester: Secondary | ICD-10-CM

## 2019-05-29 MED ORDER — DOXYLAMINE-PYRIDOXINE 10-10 MG PO TBEC: 2.0000 | DELAYED_RELEASE_TABLET | Freq: Every day | ORAL | 2 refills | Status: DC

## 2019-05-29 NOTE — Progress Notes (Signed)
ROB C/o nausea and vomiting  

## 2019-05-29 NOTE — Progress Notes (Signed)
Routine Prenatal Care Visit  Subjective  Sarah Gomez is a 23 y.o. G1P0 at [redacted]w[redacted]d being seen today for ongoing prenatal care.  She is currently monitored for the following issues for this high-risk pregnancy and has Supervision of high risk pregnancy, antepartum; Obesity affecting pregnancy; BMI 34.0-34.9,adult; Seizure disorder during pregnancy [facility]); ADHD (attention deficit hyperactivity disorder); Developmental delay; Family problems; Migraine; Mood disorder ([place]); Problems with learning; and Seizure disorder, complex partial ([place]) on their problem list.  ----------------------------------------------------------------------------------- Patient reports nausea- especially at her job. 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 She is trying to stay hydrated.    . Vag. Bleeding: None.   . Denies leaking of fluid.  ----------------------------------------------------------------------------------- The following portions of the patient's history were reviewed and updated as appropriate: allergies, current medications, past family history, past medical history, past social history, past surgical history and problem list. Problem list updated.   Objective  Blood pressure 108/70, weight 196 lb (88.9 kg), last menstrual period 02/12/2019. Pregravid weight 205 lb (93 kg) Total Weight Gain -9 lb (-4.082 kg) Urinalysis: Urine Protein    Urine Glucose    Fetal Status: Fetal Heart Rate (bpm): 161         General:  Alert, oriented and cooperative. Patient is in no acute distress.  Skin: Skin is warm and dry. No rash noted.   Cardiovascular: Normal heart rate noted  Respiratory: Normal respiratory effort, no problems with respiration noted  Abdomen: Soft, gravid, appropriate for gestational age. Pain/Pressure: Absent     Pelvic:  Cervical exam deferred        Extremities: Normal range of motion.     Mental Status: Normal mood and affect. Normal behavior. Normal judgment and  thought content.   Assessment   23 y.o. G1P0 at [redacted]w[redacted]d by  12/14/2019, by Ultrasound presenting for routine prenatal visit  Plan   FIRST Problems (from 04/30/19 to present)    Problem Noted Resolved   Supervision of high risk pregnancy, antepartum 05/01/2019 by Will Bonnet, MD No   Overview Signed 05/08/2019  5:05 PM by Rexene Agent, [place] Prenatal Labs  Dating  Blood type:     Genetic Screen 1 Screen:    AFP:     Quad:     NIPS: Antibody:   Anatomic [place]  Rubella:   Varicella:    GTT Early:               Third trimester:  RPR:     Rhogam  HBsAg:     TDaP vaccine                       Flu Shot: HIV:     Baby Food                                GBS:   Contraception  Pap:  CBB     CS/VBAC    Support Person              Obesity affecting pregnancy 05/01/2019 by Will Bonnet, MD No   BMI 34.0-34.9,adult 05/01/2019 by Will Bonnet, MD No   Seizure disorder during pregnancy [facility]) 05/01/2019 by Will Bonnet, MD No   Overview Signed 05/01/2019  9:59 AM by Will Bonnet, MD    Not on meds. Has been many years since her last one.  Preterm labor symptoms and general obstetric precautions including but not limited to vaginal bleeding, contractions, leaking of fluid and fetal movement were reviewed in detail with the patient. Please refer to After Visit Summary for other counseling recommendations.   Return in about 4 weeks (around 06/26/2019) for telephone rob.  Tresea MallJane Trenton Passow, CNM 05/29/2019 2:30 PM

## 2019-05-29 NOTE — Patient Instructions (Signed)
Morning Sickness  Morning sickness is when a woman feels nauseous during pregnancy. This nauseous feeling may or may not come with vomiting. It often occurs in the morning, but it can be a problem at any time of day. Morning sickness is most common during the first trimester. In some cases, it may continue throughout pregnancy. Although morning sickness is unpleasant, it is usually harmless unless the woman develops severe and continual vomiting (hyperemesis gravidarum), a condition that requires more intense treatment. What are the causes? The exact cause of this condition is not known, but it seems to be related to normal hormonal changes that occur in pregnancy. What increases the risk? You are more likely to develop this condition if:  You experienced nausea or vomiting before your pregnancy.  You had morning sickness during a previous pregnancy.  You are pregnant with more than one baby, such as twins. What are the signs or symptoms? Symptoms of this condition include:  Nausea.  Vomiting. How is this diagnosed? This condition is usually diagnosed based on your signs and symptoms. How is this treated? In many cases, treatment is not needed for this condition. Making some changes to what you eat may help to control symptoms. Your health care provider may also prescribe or recommend:  Vitamin B6 supplements.  Anti-nausea medicines.  Ginger. Follow these instructions at home: Medicines  Take over-the-counter and prescription medicines only as told by your health care provider. Do not use any prescription, over-the-counter, or herbal medicines for morning sickness without first talking with your health care provider.  Taking multivitamins before getting pregnant can prevent or decrease the severity of morning sickness in most women. Eating and drinking  Eat a piece of dry toast or crackers before getting out of bed in the morning.  Eat 5 or 6 small meals a day.  Eat dry and  bland foods, such as rice or a baked potato. Foods that are high in carbohydrates are often helpful.  Avoid greasy, fatty, and spicy foods.  Have someone cook for you if the smell of any food causes nausea and vomiting.  If you feel nauseous after taking prenatal vitamins, take the vitamins at night or with a snack.  Snack on protein foods between meals if you are hungry. Nuts, yogurt, and cheese are good options.  Drink fluids throughout the day.  Try ginger ale made with real ginger, ginger tea made from fresh grated ginger, or ginger candies. General instructions  Do not use any products that contain nicotine or tobacco, such as cigarettes and e-cigarettes. If you need help quitting, ask your health care provider.  Get an air purifier to keep the air in your house free of odors.  Get plenty of fresh air.  Try to avoid odors that trigger your nausea.  Consider trying these methods to help relieve symptoms: ? Wearing an acupressure wristband. These wristbands are often worn for seasickness. ? Acupuncture. Contact a health care provider if:  Your home remedies are not working and you need medicine.  You feel dizzy or light-headed.  You are losing weight. Get help right away if:  You have persistent and uncontrolled nausea and vomiting.  You faint.  You have severe pain in your abdomen. Summary  Morning sickness is when a woman feels nauseous during pregnancy. This nauseous feeling may or may not come with vomiting.  Morning sickness is most common during the first trimester.  It often occurs in the morning, but it can be a problem at   any time of day.  In many cases, treatment is not needed for this condition. Making some changes to what you eat may help to control symptoms. This information is not intended to replace advice given to you by your health care provider. Make sure you discuss any questions you have with your health care provider. Document Released:  01/11/2007 Document Revised: 12/23/2016 Document Reviewed: 12/23/2016 Elsevier Interactive Patient Education  2019 [employer] Second Trimester of Pregnancy The second trimester is from week 14 through week 27 (months 4 through 6). The second trimester is often a time when you feel your best. Your body has adjusted to being pregnant, and you begin to feel better physically. Usually, morning sickness has lessened or quit completely, you may have more energy, and you may have an increase in appetite. The second trimester is also a time when the fetus is growing rapidly. At the end of the sixth month, the fetus is about 9 inches long and weighs about 1 pounds. You will likely begin to feel the baby move (quickening) between 16 and 20 weeks of pregnancy. Body changes during your second trimester Your body continues to go through many changes during your second trimester. The changes vary from woman to woman.  Your weight will continue to increase. You will notice your lower abdomen bulging out.  You may begin to get stretch marks on your hips, abdomen, and breasts.  You may develop headaches that can be relieved by medicines. The medicines should be approved by your health care provider.  You may urinate more often because the fetus is pressing on your bladder.  You may develop or continue to have heartburn as a result of your pregnancy.  You may develop constipation because certain hormones are causing the muscles that push waste through your intestines to slow down.  You may develop hemorrhoids or swollen, bulging veins (varicose veins).  You may have back pain. This is caused by: ? Weight gain. ? Pregnancy hormones that are relaxing the joints in your pelvis. ? A shift in weight and the muscles that support your balance.  Your breasts will continue to grow and they will continue to become tender.  Your gums may bleed and may be sensitive to brushing and flossing.  Dark spots or  blotches (chloasma, mask of pregnancy) may develop on your face. This will likely fade after the baby is born.  A dark line from your belly button to the pubic area (linea nigra) may appear. This will likely fade after the baby is born.  You may have changes in your hair. These can include thickening of your hair, rapid growth, and changes in texture. Some women also have hair loss during or after pregnancy, or hair that feels dry or thin. Your hair will most likely return to normal after your baby is born. What to expect at prenatal visits During a routine prenatal visit:  You will be weighed to make sure you and the fetus are growing normally.  Your blood pressure will be taken.  Your abdomen will be measured to track your baby's growth.  The fetal heartbeat will be listened to.  Any test results from the previous visit will be discussed. Your health care provider may ask you:  How you are feeling.  If you are feeling the baby move.  If you have had any abnormal symptoms, such as leaking fluid, bleeding, severe headaches, or abdominal cramping.  If you are using any tobacco products, including cigarettes, chewing tobacco,  be taken.   Your abdomen will be measured to track your baby's growth.   The fetal heartbeat will be listened to.   Any test results from the previous visit will be discussed.  Your health care provider may ask you:   How you are feeling.   If you are feeling the baby move.   If you have had any abnormal symptoms, such as leaking fluid, bleeding, severe headaches, or abdominal cramping.   If you are using any tobacco products, including cigarettes, chewing tobacco, and electronic cigarettes.   If you have any questions.  Other tests that may be performed during your second trimester include:   Blood tests that check for:  ? Low iron levels (anemia).  ? High blood sugar that affects pregnant women (gestational diabetes) between 24 and 28 weeks.  ? Rh antibodies. This is to check for a protein on red blood cells (Rh factor).   Urine tests to check for infections, diabetes, or protein in the urine.   An ultrasound to confirm the proper growth and development of the baby.   An amniocentesis to check for possible genetic problems.   Fetal screens for spina bifida and Down syndrome.   HIV (human immunodeficiency virus) testing. Routine prenatal testing includes screening for HIV, unless you choose not to have this test.  Follow  these instructions at home:  Medicines   Follow your health care provider's instructions regarding medicine use. Specific medicines may be either safe or unsafe to take during pregnancy.   Take a prenatal vitamin that contains at least 600 micrograms (mcg) of folic acid.   If you develop constipation, try taking a stool softener if your health care provider approves.  Eating and drinking     Eat a balanced diet that includes fresh fruits and vegetables, whole grains, good sources of protein such as meat, eggs, or tofu, and low-fat dairy. Your health care provider will help you determine the amount of weight gain that is right for you.   Avoid raw meat and uncooked cheese. These carry germs that can cause birth defects in the baby.   If you have low calcium intake from food, talk to your health care provider about whether you should take a daily calcium supplement.   Limit foods that are high in fat and processed sugars, such as fried and sweet foods.   To prevent constipation:  ? Drink enough fluid to keep your urine clear or pale yellow.  ? Eat foods that are high in fiber, such as fresh fruits and vegetables, whole grains, and beans.  Activity   Exercise only as directed by your health care provider. Most women can continue their usual exercise routine during pregnancy. Try to exercise for 30 minutes at least 5 days a week. Stop exercising if you experience uterine contractions.   Avoid heavy lifting, wear low heel shoes, and practice good posture.   A sexual relationship may be continued unless your health care provider directs you otherwise.  Relieving pain and discomfort   Wear a good support bra to prevent discomfort from breast tenderness.   Take warm sitz baths to soothe any pain or discomfort caused by hemorrhoids. Use hemorrhoid cream if your health care provider approves.   Rest with your legs elevated if you have leg cramps or low back pain.   If you develop varicose veins, wear support  hose. Elevate your feet for 15 minutes, 3-4 times a day. Limit salt in your diet.  Prenatal   Care   Write down your questions. Take them to your prenatal visits.   Keep all your prenatal visits as told by your health care provider. This is important.  Safety   Wear your seat belt at all times when driving.   Make a list of emergency phone numbers, including numbers for family, friends, the hospital, and police and fire departments.  General instructions   Ask your health care provider for a referral to a local prenatal education class. Begin classes no later than the beginning of month 6 of your pregnancy.   Ask for help if you have counseling or nutritional needs during pregnancy. Your health care provider can offer advice or refer you to specialists for help with various needs.   Do not use hot tubs, steam rooms, or saunas.   Do not douche or use tampons or scented sanitary pads.   Do not cross your legs for long periods of time.   Avoid cat litter boxes and soil used by cats. These carry germs that can cause birth defects in the baby and possibly loss of the fetus by miscarriage or stillbirth.   Avoid all smoking, herbs, alcohol, and unprescribed drugs. Chemicals in these products can affect the formation and growth of the baby.   Do not use any products that contain nicotine or tobacco, such as cigarettes and e-cigarettes. If you need help quitting, ask your health care provider.   Visit your dentist if you have not gone yet during your pregnancy. Use a soft toothbrush to brush your teeth and be gentle when you floss.  Contact a health care provider if:   You have dizziness.   You have mild pelvic cramps, pelvic pressure, or nagging pain in the abdominal area.   You have persistent nausea, vomiting, or diarrhea.   You have a bad smelling vaginal discharge.   You have pain when you urinate.  Get help right away if:   You have a fever.   You are leaking fluid from your vagina.   You have  spotting or bleeding from your vagina.   You have severe abdominal cramping or pain.   You have rapid weight gain or weight loss.   You have shortness of breath with chest pain.   You notice sudden or extreme swelling of your face, hands, ankles, feet, or legs.   You have not felt your baby move in over an hour.   You have severe headaches that do not go away when you take medicine.   You have vision changes.  Summary   The second trimester is from week 14 through week 27 (months 4 through 6). It is also a time when the fetus is growing rapidly.   Your body goes through many changes during pregnancy. The changes vary from woman to woman.   Avoid all smoking, herbs, alcohol, and unprescribed drugs. These chemicals affect the formation and growth your baby.   Do not use any tobacco products, such as cigarettes, chewing tobacco, and e-cigarettes. If you need help quitting, ask your health care provider.   Contact your health care provider if you have any questions. Keep all prenatal visits as told by your health care provider. This is important.  This information is not intended to replace advice given to you by your health care provider. Make sure you discuss any questions you have with your health care provider.  Document Released: 11/14/2001 Document Revised: 12/26/2016 Document Reviewed: 12/26/2016  Elsevier Interactive Patient Education  2019

## 2019-06-03 LAB — MATERNIT 21 PLUS CORE, BLOOD
Fetal Fraction: 15
Result (T21): NEGATIVE
Trisomy 13 (Patau syndrome): NEGATIVE
Trisomy 18 (Edwards syndrome): NEGATIVE
Trisomy 21 (Down syndrome): NEGATIVE

## 2019-06-09 ENCOUNTER — Other Ambulatory Visit: Payer: Self-pay

## 2019-06-09 ENCOUNTER — Ambulatory Visit (INDEPENDENT_AMBULATORY_CARE_PROVIDER_SITE_OTHER): Payer: BC Managed Care – PPO | Admitting: Neurology

## 2019-06-09 DIAGNOSIS — G43009 Migraine without aura, not intractable, without status migrainosus: Secondary | ICD-10-CM | POA: Diagnosis not present

## 2019-06-09 DIAGNOSIS — Z87898 Personal history of other specified conditions: Secondary | ICD-10-CM

## 2019-06-10 ENCOUNTER — Telehealth: Payer: Self-pay | Admitting: Neurology

## 2019-06-10 NOTE — Telephone Encounter (Signed)
Pt had one hour EEG with [facility] 06/09/19. Work note printed. Pt will come by the office and pick up. She did not have her employers fax number.

## 2019-06-10 NOTE — Telephone Encounter (Signed)
New Message  Patient verbalized wanting to know if she can get a work note for her appointment she had on yesterday.

## 2019-06-11 ENCOUNTER — Ambulatory Visit (INDEPENDENT_AMBULATORY_CARE_PROVIDER_SITE_OTHER): Payer: Managed Care, Other (non HMO) | Admitting: Maternal Newborn

## 2019-06-11 ENCOUNTER — Encounter: Payer: Self-pay | Admitting: Neurology

## 2019-06-11 ENCOUNTER — Other Ambulatory Visit: Payer: Self-pay

## 2019-06-11 ENCOUNTER — Telehealth: Payer: Self-pay

## 2019-06-11 ENCOUNTER — Encounter: Payer: Self-pay | Admitting: Maternal Newborn

## 2019-06-11 VITALS — BP 108/70 | Wt 195.6 lb

## 2019-06-11 DIAGNOSIS — O0991 Supervision of high risk pregnancy, unspecified, first trimester: Secondary | ICD-10-CM

## 2019-06-11 DIAGNOSIS — Z3A13 13 weeks gestation of pregnancy: Secondary | ICD-10-CM

## 2019-06-11 DIAGNOSIS — O0990 Supervision of high risk pregnancy, unspecified, unspecified trimester: Secondary | ICD-10-CM

## 2019-06-11 DIAGNOSIS — O219 Vomiting of pregnancy, unspecified: Secondary | ICD-10-CM

## 2019-06-11 MED ORDER — PROCHLORPERAZINE MALEATE 10 MG PO TABS: 10.0000 mg | ORAL_TABLET | Freq: Four times a day (QID) | ORAL | 3 refills | Status: DC | PRN

## 2019-06-11 NOTE — Telephone Encounter (Signed)
Pt calling; having lightheadedness and dizzy spells; migrain x2d.  [phone] Adv there is more blood to be pumped around - to move slower - stand up, sit down, get out of bed slower, don't turn head fast.  Pt is not able to eat well; questionable as to whether she is able to keep liquids down.  Parked on SP's ext to schedule face to face appt as pt is not happy about her next appt being televisit.

## 2019-06-11 NOTE — Progress Notes (Signed)
Really bad migraines (2 days), dizziness, cannot eat at all

## 2019-06-11 NOTE — Progress Notes (Signed)
Routine Prenatal Care Visit  Subjective  Sarah HarrowVictoria S Gomez is a 23 y.o. G1P0 at 6642w3d being seen today for ongoing prenatal care.  She is currently monitored for the following issues for this high-risk pregnancy and has Supervision of high risk pregnancy, antepartum; Obesity affecting pregnancy; BMI 34.0-34.9,adult; Seizure disorder during pregnancy [facility](HCC); ADHD (attention deficit hyperactivity disorder); Developmental delay; Family problems; Migraine; Mood disorder (HCC); Problems with learning; and Seizure disorder, complex partial (HCC) on their problem list.  ----------------------------------------------------------------------------------- Patient reports headache.  Eating limited foods, nausea is ongoing. Able to tolerate fluids/water. Dizziness is increasing. Vag. Bleeding: None. No leaking of fluid.  ----------------------------------------------------------------------------------- The following portions of the patient's history were reviewed and updated as appropriate: allergies, current medications, past family history, past medical history, past social history, past surgical history and problem list. Problem list updated.   Objective  Blood pressure 108/70, weight 195 lb 9.6 oz (88.7 kg), last menstrual period 02/12/2019. Pregravid weight 205 lb (93 kg) Total Weight Gain -9 lb 6.4 oz (-4.264 kg)  Fetal Status: Fetal Heart Rate (bpm): 155         General:  Alert, oriented and cooperative. Patient is in no acute distress.  Skin: Skin is warm and dry. No rash noted.   Cardiovascular: Normal heart rate noted  Respiratory: Normal respiratory effort, no problems with respiration noted  Abdomen: Soft, gravid, appropriate for gestational age. Pain/Pressure: Absent     Pelvic:  Cervical exam deferred        Extremities: Normal range of motion.     Mental Status: Normal mood and affect. Normal behavior. Normal judgment and thought content.     Assessment   23 y.o. G1P0 at 3742w3d,  EDD 12/14/2019 by Ultrasound presenting for a work-in prenatal visit.  Plan   FIRST Problems (from 04/30/19 to present)    Problem Noted Resolved   Supervision of high risk pregnancy, antepartum 05/01/2019 by Conard NovakJackson, Stephen D, MD No   Overview Signed 05/08/2019  5:05 PM by Oswaldo ConroySchmid, Janson Lamar Y, CNM    Clinic [facility] Prenatal Labs  Dating  Blood type:     Genetic Screen 1 Screen:    AFP:     Quad:     NIPS: Antibody:   Anatomic US  Rubella:   Varicella:    GTT Early:               Third trimester:  RPR:     Rhogam  HBsAg:     TDaP vaccine                       Flu Shot: HIV:     Baby Food                                GBS:   Contraception  Pap:  CBB     CS/VBAC    Support Person              Obesity affecting pregnancy 05/01/2019 by Conard NovakJackson, Stephen D, MD No   BMI 34.0-34.9,adult 05/01/2019 by Conard NovakJackson, Stephen D, MD No   Seizure disorder during pregnancy [facility](HCC) 05/01/2019 by Conard NovakJackson, Stephen D, MD No   Overview Signed 05/01/2019  9:59 AM by Conard NovakJackson, Stephen D, MD    Not on meds. Has been many years since her last one.         Try Compazine for headaches and ongoing nausea. Recommended prenatal  vitamins with iron, samples given and she will call for Rx. Drinks such as Gatorade and others with calories to supplement food intake. Seek care if unable to tolerate food/liquids over 24 hour period.  Please refer to After Visit Summary for other counseling recommendations.   Keep scheduled ROB 7/23.  Avel Sensor, CNM 06/11/2019  5:08 PM

## 2019-06-12 ENCOUNTER — Telehealth: Payer: Self-pay

## 2019-06-12 ENCOUNTER — Encounter: Payer: Self-pay | Admitting: Maternal Newborn

## 2019-06-12 NOTE — Telephone Encounter (Signed)
Note is at the front desk for her to pick up.

## 2019-06-12 NOTE — Telephone Encounter (Signed)
Pt calling; job is going to 7d/wk, 10/d; has no AC whatsoever.  Can she have a note to only work 62G/BT d/t complications, getting dizzy and lightheaded?  [phone]

## 2019-06-13 ENCOUNTER — Encounter: Payer: Self-pay | Admitting: Emergency Medicine

## 2019-06-13 ENCOUNTER — Other Ambulatory Visit: Payer: Self-pay

## 2019-06-13 ENCOUNTER — Emergency Department
Admission: EM | Admit: 2019-06-13 | Discharge: 2019-06-13 | Disposition: A | Discharge status: Home / Self Care | Payer: BC Managed Care – PPO | Attending: Emergency Medicine | Admitting: Emergency Medicine

## 2019-06-13 DIAGNOSIS — O219 Vomiting of pregnancy, unspecified: Secondary | ICD-10-CM | POA: Diagnosis present

## 2019-06-13 DIAGNOSIS — O99282 Endocrine, nutritional and metabolic diseases complicating pregnancy, second trimester: Secondary | ICD-10-CM | POA: Diagnosis not present

## 2019-06-13 DIAGNOSIS — Z3A13 13 weeks gestation of pregnancy: Secondary | ICD-10-CM | POA: Insufficient documentation

## 2019-06-13 DIAGNOSIS — E860 Dehydration: Secondary | ICD-10-CM | POA: Diagnosis not present

## 2019-06-13 DIAGNOSIS — F909 Attention-deficit hyperactivity disorder, unspecified type: Secondary | ICD-10-CM | POA: Insufficient documentation

## 2019-06-13 DIAGNOSIS — O99342 Other mental disorders complicating pregnancy, second trimester: Secondary | ICD-10-CM | POA: Diagnosis not present

## 2019-06-13 DIAGNOSIS — Z79899 Other long term (current) drug therapy: Secondary | ICD-10-CM | POA: Insufficient documentation

## 2019-06-13 DIAGNOSIS — O210 Mild hyperemesis gravidarum: Secondary | ICD-10-CM

## 2019-06-13 LAB — BASIC METABOLIC PANEL
Anion gap: 7 (ref 5–15)
BUN: 6 mg/dL (ref 6–20)
CO2: 23 mmol/L (ref 22–32)
Calcium: 9.1 mg/dL (ref 8.9–10.3)
Chloride: 106 mmol/L (ref 98–111)
Creatinine, Ser: 0.49 mg/dL (ref 0.44–1.00)
GFR calc Af Amer: >60 mL/min (ref >60)
GFR calc non Af Amer: >60 mL/min (ref >60)
Glucose, Bld: 84 mg/dL (ref 70–99)
Potassium: 3.7 mmol/L (ref 3.5–5.1)
Sodium: 136 mmol/L (ref 135–145)

## 2019-06-13 LAB — CBC
HCT: 37.1 % (ref 36.0–46.0)
Hemoglobin: 12.9 g/dL (ref 12.0–15.0)
MCH: 29.3 pg (ref 26.0–34.0)
MCHC: 34.8 g/dL (ref 30.0–36.0)
MCV: 84.1 fL (ref 80.0–100.0)
Platelets: 227 10*3/uL (ref 150–400)
RBC: 4.41 MIL/uL (ref 3.87–5.11)
RDW: 12.2 % (ref 11.5–15.5)
WBC: 7.8 10*3/uL (ref 4.0–10.5)
nRBC: 0 % (ref 0.0–0.2)

## 2019-06-13 LAB — GLUCOSE, CAPILLARY: Glucose-Capillary: 68 mg/dL — ABNORMAL LOW (ref 70–99)

## 2019-06-13 MED ORDER — MORPHINE SULFATE (PF) 2 MG/ML IV SOLN
INTRAVENOUS | Status: AC
  Filled 2019-06-13: qty 2

## 2019-06-13 MED ORDER — PROMETHAZINE HCL 12.5 MG PO TABS: 12.5000 mg | ORAL_TABLET | Freq: Four times a day (QID) | ORAL | 0 refills | Status: DC | PRN

## 2019-06-13 MED ORDER — ONDANSETRON HCL 4 MG/2ML IJ SOLN
4.0000 mg | Freq: Once | INTRAMUSCULAR | Status: AC
  Administered 2019-06-13: 10:00:00 4 mg via INTRAVENOUS
  Filled 2019-06-13: qty 2

## 2019-06-13 MED ORDER — SODIUM CHLORIDE 0.9 % IV SOLN
1000.0000 mL | Freq: Once | INTRAVENOUS | Status: AC
  Administered 2019-06-13: 10:00:00 1000 mL via INTRAVENOUS

## 2019-06-13 MED ORDER — MORPHINE SULFATE (PF) 4 MG/ML IV SOLN
4.0000 mg | Freq: Once | INTRAVENOUS | Status: AC
  Administered 2019-06-13: 4 mg via INTRAVENOUS

## 2019-06-13 NOTE — ED Provider Notes (Signed)
[facility] Emergency Department Provider Note   ____________________________________________    I have reviewed the triage vital signs and the nursing notes.   HISTORY  Chief Complaint Dizziness and Emesis     HPI Sarah Gomez is a 23 y.o. female who reports that she is [redacted] weeks pregnant with her first pregnancy, she has been having nausea and vomiting throughout this trimester.  She denies abdominal pain.  No vaginal bleeding.  She has been working with her OB and called and told her OB that she was dizzy today so they sent her to the emergency department.  She denies fevers or chills.  No cough or shortness of breath.  Past Medical History:  Diagnosis Date  . ADHD   . Migraine   . Mood disorder (HCC)   . Seizures [facility](HCC)     Patient Active Problem List   Diagnosis Date Noted  . Mood disorder (HCC) 05/13/2019  . Supervision of high risk pregnancy, antepartum 05/01/2019  . Obesity affecting pregnancy 05/01/2019  . BMI 34.0-34.9,adult 05/01/2019  . Seizure disorder during pregnancy (HCC) 05/01/2019  . Family problems 08/07/2014  . Problems with learning 08/07/2014  . Migraine 01/28/2013  . Seizure disorder, 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/28/2013  . ADHD (attention deficit hyperactivity disorder) 08/14/2012  . Developmental delay 08/14/2012    No past surgical history on file.  Prior to Admission medications   Medication Sig Start Date End Date Taking? Authorizing Provider  prenatal vitamin w/FE, FA (PRENATAL 1 + 1) 27-1 MG TABS tablet Take 1 tablet by mouth daily at 12 noon.   Yes [provider]  sertraline (ZOLOFT) 50 MG tablet Take 50 mg by mouth daily.  05/15/19  Yes [provider]  promethazine (PHENERGAN) 12.5 MG tablet Take 1 tablet (12.5 mg total) by mouth every 6 (six) hours as needed for nausea or vomiting. 06/13/19   Jene EveryKinner, Zamari Bonsall, MD     Allergies Patient has no known allergies.  Family History  Problem  Relation Age of Onset  . Multiple sclerosis Mother   . Hypertension Father   . Heart Problems Sister     Social History Social History   Tobacco Use  . Smoking status: Never Smoker  . Smokeless tobacco: Never Used  Substance Use Topics  . Alcohol use: Not Currently  . Drug use: Not Currently    Review of Systems  Constitutional: No fever/chills Eyes: No visual changes.  ENT: No sore throat. Cardiovascular: Denies chest pain. Respiratory: Denies shortness of breath. Gastrointestinal: As above Genitourinary: Negative for dysuria. Musculoskeletal: Negative for back pain. Skin: Negative for rash. Neurological: Negative for headaches   ____________________________________________   PHYSICAL EXAM:  VITAL SIGNS: ED Triage Vitals [06/13/19 0850]  Enc Vitals Group     BP 121/65     Pulse Rate 75     Resp 16     Temp 99.8 F (37.7 C)     Temp Source Oral     SpO2 100 %     Weight 88.5 kg (195 lb)     Height 1.651 m (5\' 5" )     Head Circumference      Peak Flow      Pain Score 5     Pain Loc      Pain Edu?      Excl. in GC?     Constitutional: Alert and oriented.  Eyes: Conjunctivae are normal.   Nose: No congestion/rhinnorhea. Mouth/Throat: Mucous membranes are dry  Cardiovascular:  Normal rate, regular rhythm. Grossly normal heart sounds.  Good peripheral circulation. Respiratory: Normal respiratory effort.  No retractions. Lungs CTAB. Gastrointestinal: Soft and nontender. No distention.  Musculoskeletal: No lower extremity tenderness nor edema.  Warm and well perfused Neurologic:  Normal speech and language. No gross focal neurologic deficits are appreciated.  Skin:  Skin is warm, dry and intact. No rash noted. Psychiatric: Mood and affect are normal. Speech and behavior are normal.  ____________________________________________   LABS (all labs ordered are listed, but only abnormal results are displayed)  Labs Reviewed  GLUCOSE, CAPILLARY -  Abnormal; Notable for the following components:      Result Value   Glucose-Capillary 68 (*)    All other components within normal limits  BASIC METABOLIC PANEL  CBC  URINALYSIS, COMPLETE (UACMP) WITH MICROSCOPIC  CBG MONITORING, ED   ____________________________________________  EKG  ED ECG REPORT I, Lavonia Drafts, the attending physician, personally viewed and interpreted this ECG.  Date: 06/13/2019  Rhythm: normal sinus rhythm QRS Axis: normal Intervals: normal ST/T Wave abnormalities: normal Narrative Interpretation: no evidence of acute ischemia  ____________________________________________  RADIOLOGY  None ____________________________________________   PROCEDURES  Procedure(s) performed: No  Procedures   Critical Care performed: No ____________________________________________   INITIAL IMPRESSION / ASSESSMENT AND PLAN / ED COURSE  Pertinent labs & imaging results that were available during my care of the patient were reviewed by me and considered in my medical decision making (see chart for details).  Patient well-appearing and in no acute distress.  Symptoms are consistent with morning sickness/dehydration.  Will give IV fluids, single dose IV Zofran, check labs and reevaluate.   Lab work is overall reassuring, patient feels much better after IV fluids, is tolerating p.o.'s, will start her on Phenergan as needed, outpatient follow-up as needed   ____________________________________________   FINAL CLINICAL IMPRESSION(S) / ED DIAGNOSES  Final diagnoses:  Morning sickness  Dehydration        Note:  This document was prepared using Dragon voice recognition software and may include unintentional dictation errors.   Lavonia Drafts, MD 06/13/19 1312

## 2019-06-13 NOTE — ED Notes (Signed)
Pt states that she cannot give a urine sample at this time. Sterile cup and wipe provided at the bedside for pt.

## 2019-06-13 NOTE — Telephone Encounter (Signed)
Pt aware.  States she is at the hosp getting ck'd out.  Hasn't been seen yet.  Will p/u note after that.

## 2019-06-13 NOTE — ED Notes (Signed)
Pt is being discharged to home with her mother. Pt is Aox4, VSS, pt does not show any signs of distress or c/o any nausea/emesis at this time. AVS/RX was given and explained to the pt and she verbalized understanding of all information.

## 2019-06-13 NOTE — ED Notes (Signed)
Pt is AOx4, does not c/o any pain or N/V at this time and she does not show any signs of distress. She is in bed with rails upx2 and her mother is at the bedside.

## 2019-06-13 NOTE — ED Triage Notes (Signed)
Pt reports being [redacted] weeks pregnant, reports dizziness, vomiting for a few days. Pt also states "they said I have a blood clot near the baby".

## 2019-06-17 ENCOUNTER — Telehealth: Payer: Self-pay

## 2019-06-17 NOTE — Procedures (Signed)
ELECTROENCEPHALOGRAM REPORT  Date of Study: 01/18/1996  Patient's Name: Sarah Gomez MRN: [id number] Date of Birth: 06/09/2019  Referring Provider: Dr. Ellouise Newer  Clinical History: This is a 23 year old woman with a history of seizures when younger, diagnosed as Benign Rolandic Epilepsy, seizure-free since age 5 off seizure medication. She is reporting more forgetfulness.  Medications: prenatal vitamin w/FE, FA (PRENATAL 1 + 1) 27-1 MG TABS tablet  Technical Summary: A multichannel digital 1-hour EEG recording measured by the international 10-20 system with electrodes applied with paste and impedances below 5000 ohms performed in our laboratory with EKG monitoring in an awake and asleep patient.  Photic stimulation was performed.  The digital EEG was referentially recorded, reformatted, and digitally filtered in a variety of bipolar and referential montages for optimal display.    Description: The patient is awake and asleep during the recording.  During maximal wakefulness, there is a symmetric, medium voltage 9.5-10 Hz posterior dominant rhythm that attenuates with eye opening.  The record is symmetric.  During drowsiness and sleep, there is an increase in theta slowing of the background.  Vertex waves and symmetric sleep spindles were seen. There are occasional small sharp spikes in the bilateral central regions, L>R. Photic stimulation did not elicit any abnormalities.  There were no epileptiform discharges or electrographic seizures seen.    EKG lead was unremarkable.  Impression: This 1-hour awake and asleep EEG is normal.    Clinical Correlation: A normal EEG does not exclude a clinical diagnosis of epilepsy. Small sharp spikes are a benign variant with no pathologic significance. If further clinical questions remain, prolonged EEG may be helpful.  Clinical correlation is advised.   Ellouise Newer, M.D.

## 2019-06-17 NOTE — Telephone Encounter (Signed)
Left message informing pt that EEG was normal and that Dr. Delice Lesch did not see an indication of starting a new medication. Asked pt to return call if she had any questions or concerns.

## 2019-06-17 NOTE — Telephone Encounter (Signed)
-----   Message from Cameron Sprang, MD sent at 06/17/2019  7:58 AM EDT ----- Pls let her know the EEG was normal. Pls see how she is doing, no indication to start any medication at this point. Thanks

## 2019-06-19 ENCOUNTER — Other Ambulatory Visit: Payer: Self-pay

## 2019-06-19 ENCOUNTER — Ambulatory Visit (INDEPENDENT_AMBULATORY_CARE_PROVIDER_SITE_OTHER): Payer: Managed Care, Other (non HMO) | Admitting: Advanced Practice Midwife

## 2019-06-19 ENCOUNTER — Encounter: Payer: Self-pay | Admitting: Advanced Practice Midwife

## 2019-06-19 VITALS — BP 118/74 | Wt 199.0 lb

## 2019-06-19 DIAGNOSIS — O0990 Supervision of high risk pregnancy, unspecified, unspecified trimester: Secondary | ICD-10-CM

## 2019-06-19 DIAGNOSIS — O99212 Obesity complicating pregnancy, second trimester: Secondary | ICD-10-CM

## 2019-06-19 DIAGNOSIS — Z3A14 14 weeks gestation of pregnancy: Secondary | ICD-10-CM

## 2019-06-19 NOTE — Patient Instructions (Signed)

## 2019-06-19 NOTE — Progress Notes (Signed)
Routine Prenatal Care Visit  Subjective  Sarah Gomez is a 23 y.o. G1P0 at 112w4d being seen today for ongoing prenatal care.  She is currently monitored for the following issues for this high-risk pregnancy and has Supervision of high risk pregnancy, antepartum; Obesity affecting pregnancy; BMI 34.0-34.9,adult; Seizure disorder during pregnancy [facility](HCC); ADHD (attention deficit hyperactivity disorder); Developmental delay; Family problems; Migraine; Mood disorder (HCC); Problems with learning; and Seizure disorder, complex partial (HCC) on their problem list.  ----------------------------------------------------------------------------------- Patient reports she was seen at the hospital on Sunday for feeling lightheaded and falling. She was dehydrated and her blood sugar was low. She is trying to increase her fluid intake and carries snacks with her. She is encouraged to increase protein as well.  She was seen by neuro for her history of seizures and cleared for the pregnancy without medication.   . Vag. Bleeding: None.   . Denies leaking of fluid.  ----------------------------------------------------------------------------------- The following portions of the patient's history were reviewed and updated as appropriate: allergies, current medications, past family history, past medical history, past social history, past surgical history and problem list. Problem list updated.   Objective  Blood pressure 118/74, weight 199 lb (90.3 kg), last menstrual period 02/12/2019. Pregravid weight 205 lb (93 kg) Total Weight Gain -6 lb (-2.722 kg) Urinalysis: Urine Protein    Urine Glucose    Fetal Status: Fetal Heart Rate (bpm): 145         General:  Alert, oriented and cooperative. Patient is in no acute distress.  Skin: Skin is warm and dry. No rash noted.   Cardiovascular: Normal heart rate noted  Respiratory: Normal respiratory effort, no problems with respiration noted  Abdomen: Soft, gravid,  appropriate for gestational age. Pain/Pressure: Absent     Pelvic:  Cervical exam deferred        Extremities: Normal range of motion.     Mental Status: Normal mood and affect. Normal behavior. Normal judgment and thought content.   Assessment   23 y.o. G1P0 at 582w4d by  12/14/2019, by Ultrasound presenting for routine prenatal visit  Plan   FIRST Problems (from 04/30/19 to present)    Problem Noted Resolved   Supervision of high risk pregnancy, antepartum 05/01/2019 by Conard NovakJackson, Stephen D, MD No   Overview Signed 05/08/2019  5:05 PM by Oswaldo ConroySchmid, Jacelyn Y, CNM    Clinic [facility] Prenatal Labs  Dating  Blood type:     Genetic Screen 1 Screen:    AFP:     Quad:     NIPS: Antibody:   Anatomic US  Rubella:   Varicella:    GTT Early:               Third trimester:  RPR:     Rhogam  HBsAg:     TDaP vaccine                       Flu Shot: HIV:     Baby Food                                GBS:   Contraception  Pap:  CBB     CS/VBAC    Support Person              Obesity affecting pregnancy 05/01/2019 by Conard NovakJackson, Stephen D, MD No   BMI 34.0-34.9,adult 05/01/2019 by Conard NovakJackson, Stephen D, MD No   Seizure disorder  during pregnancy ([place]) 05/01/2019 by Will Bonnet, MD No   Overview Signed 05/01/2019  9:59 AM by Will Bonnet, MD    Not on meds. Has been many years since her last one.          Preterm labor symptoms and general obstetric precautions including but not limited to vaginal bleeding, contractions, leaking of fluid and fetal movement were reviewed in detail with the patient. Please refer to After Visit Summary for other counseling recommendations.   Return in about 4 weeks (around 07/17/2019) for anatomy scan and rob.  Rod Can, CNM 06/19/2019 4:19 PM

## 2019-06-19 NOTE — Progress Notes (Signed)
No vb. No lof. Pt fell Sunday, went to hospital. States she was dehydrated and BS low.

## 2019-06-26 ENCOUNTER — Encounter: Payer: Managed Care, Other (non HMO) | Admitting: Obstetrics and Gynecology

## 2019-07-17 ENCOUNTER — Ambulatory Visit (INDEPENDENT_AMBULATORY_CARE_PROVIDER_SITE_OTHER): Payer: Managed Care, Other (non HMO) | Admitting: Maternal Newborn

## 2019-07-17 ENCOUNTER — Ambulatory Visit (INDEPENDENT_AMBULATORY_CARE_PROVIDER_SITE_OTHER): Payer: BC Managed Care – PPO

## 2019-07-17 ENCOUNTER — Encounter: Payer: Self-pay | Admitting: Maternal Newborn

## 2019-07-17 ENCOUNTER — Other Ambulatory Visit: Payer: Self-pay

## 2019-07-17 VITALS — BP 122/74 | Wt 198.0 lb

## 2019-07-17 DIAGNOSIS — O0990 Supervision of high risk pregnancy, unspecified, unspecified trimester: Secondary | ICD-10-CM

## 2019-07-17 DIAGNOSIS — Z3A18 18 weeks gestation of pregnancy: Secondary | ICD-10-CM

## 2019-07-17 DIAGNOSIS — Z363 Encounter for antenatal screening for malformations: Secondary | ICD-10-CM

## 2019-07-17 DIAGNOSIS — O99212 Obesity complicating pregnancy, second trimester: Secondary | ICD-10-CM

## 2019-07-17 NOTE — Patient Instructions (Signed)

## 2019-07-17 NOTE — Progress Notes (Signed)
Routine Prenatal Care Visit  Subjective  Sarah Gomez is a 23 y.o. G1P0 at [redacted]w[redacted]d being seen today for ongoing prenatal care.  She is currently monitored for the following issues for this high-risk pregnancy and has Supervision of high risk pregnancy, antepartum; Obesity affecting pregnancy; BMI 34.0-34.9,adult; Seizure disorder during pregnancy [facility]); ADHD (attention deficit hyperactivity disorder); Developmental delay; Family problems; Migraine; Mood disorder ([place]); Problems with learning; and Seizure disorder, complex partial ([place]) on their problem list.  ----------------------------------------------------------------------------------- Patient reports that nausea has improved, and she is able to eat most foods except for meat. Vag. Bleeding: None.  Movement: Present. No leaking of fluid.  ----------------------------------------------------------------------------------- The following portions of the patient's history were reviewed and updated as appropriate: allergies, current medications, past family history, past medical history, past social history, past surgical history and problem list. Problem list updated.   Objective  Blood pressure 122/74, weight 198 lb (89.8 kg), last menstrual period 02/12/2019. Pregravid weight 205 lb (93 kg) Total Weight Gain -7 lb (-3.175 kg)  Fetal Status: Fetal Heart Rate (bpm): 150 ([place])   Movement: Present     General:  Alert, oriented and cooperative. Patient is in no acute distress.  Skin: Skin is warm and dry. No rash noted.   Cardiovascular: Normal heart rate noted  Respiratory: Normal respiratory effort, no problems with respiration noted  Abdomen: Soft, gravid, appropriate for gestational age. Pain/Pressure: Absent     Pelvic:  Cervical exam deferred        Extremities: Normal range of motion.     Mental Status: Normal mood and affect. Normal behavior. Normal judgment and thought content.     Assessment   23 y.o. G1P0 at [redacted]w[redacted]d, EDD  12/14/2019 by Ultrasound presenting for a routine prenatal visit.  Plan   FIRST Problems (from 04/30/19 to present)    Problem Noted Resolved   Supervision of high risk pregnancy, antepartum 05/01/2019 by Will Bonnet, MD No   Overview Signed 05/08/2019  5:05 PM by Rexene Agent, [place] Prenatal Labs  Dating  Blood type:     Genetic Screen 1 Screen:    AFP:     Quad:     NIPS: Antibody:   Anatomic [place]  Rubella:   Varicella:    GTT Early:               Third trimester:  RPR:     Rhogam  HBsAg:     TDaP vaccine                       Flu Shot: HIV:     Baby Food                                GBS:   Contraception  Pap:  CBB     CS/VBAC    Support Person              Obesity affecting pregnancy 05/01/2019 by Will Bonnet, MD No   BMI 34.0-34.9,adult 05/01/2019 by Will Bonnet, MD No   Seizure disorder during pregnancy [facility]) 05/01/2019 by Will Bonnet, MD No   Overview Signed 05/01/2019  9:59 AM by Will Bonnet, MD    Not on meds. Has been many years since her last one.       Anatomy scan today with complete and normal female anatomy, FHR 150  bpm, cephalic presentation. Results reviewed with patient.  Reviewed sources of dietary protein.  Please refer to After Visit Summary for other counseling recommendations.   Return in about 4 weeks (around 08/14/2019) for ROB telephone.  Marcelyn BruinsJacelyn Tanasha Menees, CNM 07/17/2019  4:55 PM

## 2019-07-17 NOTE — Progress Notes (Signed)
No vb. No lof. Anatomy scan today. 

## 2019-07-28 ENCOUNTER — Telehealth: Payer: Self-pay

## 2019-07-28 NOTE — Telephone Encounter (Signed)
Pt transferred by front desk. Spoke w/pt. She states she noticed a little pink (looked like blood) in her vomit this morning when she first woke up. Denies eating anything prior. Advised throat possible dry/irritated and could have burst a small blood vessel while vomiting causing the pink tinge in emesis. Could also have some post nasal drainage that may have had some blood in it. Advised to remain well hydrated & monitor and notify us of any further/increased amount of blood noticed in emesis.

## 2019-07-30 ENCOUNTER — Ambulatory Visit (INDEPENDENT_AMBULATORY_CARE_PROVIDER_SITE_OTHER)
Admission: EM | Admit: 2019-07-30 | Discharge: 2019-07-30 | Disposition: A | Discharge status: Home / Self Care | Payer: BC Managed Care – PPO | Source: Home / Self Care | Attending: Family Medicine | Admitting: Family Medicine

## 2019-07-30 ENCOUNTER — Encounter: Payer: Self-pay | Admitting: Emergency Medicine

## 2019-07-30 ENCOUNTER — Emergency Department
Admission: EM | Admit: 2019-07-30 | Discharge: 2019-07-30 | Disposition: A | Discharge status: Home / Self Care | Payer: BC Managed Care – PPO | Attending: Family Medicine | Admitting: Family Medicine

## 2019-07-30 ENCOUNTER — Other Ambulatory Visit: Payer: Self-pay

## 2019-07-30 DIAGNOSIS — Z3A20 20 weeks gestation of pregnancy: Secondary | ICD-10-CM | POA: Diagnosis not present

## 2019-07-30 DIAGNOSIS — O9989 Other specified diseases and conditions complicating pregnancy, childbirth and the puerperium: Secondary | ICD-10-CM | POA: Diagnosis not present

## 2019-07-30 DIAGNOSIS — J029 Acute pharyngitis, unspecified: Secondary | ICD-10-CM | POA: Diagnosis not present

## 2019-07-30 DIAGNOSIS — Z20828 Contact with and (suspected) exposure to other viral communicable diseases: Secondary | ICD-10-CM | POA: Diagnosis not present

## 2019-07-30 DIAGNOSIS — Z20822 Contact with and (suspected) exposure to covid-19: Secondary | ICD-10-CM

## 2019-07-30 DIAGNOSIS — O99352 Diseases of the nervous system complicating pregnancy, second trimester: Secondary | ICD-10-CM | POA: Insufficient documentation

## 2019-07-30 DIAGNOSIS — R569 Unspecified convulsions: Secondary | ICD-10-CM | POA: Diagnosis not present

## 2019-07-30 LAB — BASIC METABOLIC PANEL
Anion gap: 7 (ref 5–15)
BUN: <5 mg/dL — ABNORMAL LOW (ref 6–20)
CO2: 25 mmol/L (ref 22–32)
Calcium: 8.7 mg/dL — ABNORMAL LOW (ref 8.9–10.3)
Chloride: 105 mmol/L (ref 98–111)
Creatinine, Ser: 0.4 mg/dL — ABNORMAL LOW (ref 0.44–1.00)
GFR calc Af Amer: >60 mL/min (ref >60)
GFR calc non Af Amer: >60 mL/min (ref >60)
Glucose, Bld: 79 mg/dL (ref 70–99)
Potassium: 3.7 mmol/L (ref 3.5–5.1)
Sodium: 137 mmol/L (ref 135–145)

## 2019-07-30 LAB — CBC
HCT: 33.5 % — ABNORMAL LOW (ref 36.0–46.0)
Hemoglobin: 11.4 g/dL — ABNORMAL LOW (ref 12.0–15.0)
MCH: 29.4 pg (ref 26.0–34.0)
MCHC: 34 g/dL (ref 30.0–36.0)
MCV: 86.3 fL (ref 80.0–100.0)
Platelets: 217 10*3/uL (ref 150–400)
RBC: 3.88 MIL/uL (ref 3.87–5.11)
RDW: 12.7 % (ref 11.5–15.5)
WBC: 8.8 10*3/uL (ref 4.0–10.5)
nRBC: 0 % (ref 0.0–0.2)

## 2019-07-30 LAB — RAPID STREP SCREEN (MED CTR MEBANE ONLY): Streptococcus, Group A Screen (Direct): NEGATIVE

## 2019-07-30 NOTE — Discharge Instructions (Addendum)
Please follow up with your OB.  Lots of fluids.  Take care  Dr. Lacinda Axon

## 2019-07-30 NOTE — ED Triage Notes (Signed)
Pt reports 20 weeks and is having some sinus problems and chest pain.

## 2019-07-30 NOTE — ED Notes (Signed)
Pt called from [place] to treatment room, no response. [facility] called and advised they were trying to access patient but the chart was locked. Pt sough care elsewhere.

## 2019-07-30 NOTE — ED Triage Notes (Signed)
Patient here for COVID testing, nasal congestion and sore throat. Patient states sore throat started yesterday and nasal congestion started Sunday. Patient had a positive exposure 1 week ago to a co-worker. She was seen at [facility] this morning and she was advised to go to the ER for further evaluation because she is [redacted] weeks pregnant. Patient stated she had to wait too long in the ER so she came here.

## 2019-07-31 LAB — NOVEL CORONAVIRUS, NAA (HOSP ORDER, SEND-OUT TO REF LAB; TAT 18-24 HRS): SARS-CoV-2, NAA: NOT DETECTED

## 2019-07-31 NOTE — ED Provider Notes (Signed)
[facility]    CSN: [id number] Arrival date & time: 07/30/19  1340  History   Chief Complaint Chief Complaint  Patient presents with  . Sore Throat  . Nasal Congestion   HPI  23 year old female who is currently [redacted] weeks pregnant presents with multiple complaints.  [facility] and was advised to go to the ER given presentation, history. Patient states that she was waiting for too long and thus left without being seen. She decided to come here for evaluation.  Patient reports recent exposure to a coworker who tested positive for COVID. She has several complaints today. Had runny nose and migraine on Sunday. Chest pain yesterday and today. Describes as a pressure sensation. Also reports SOB and one episode of emesis yesterday. She reports that her emesis was pink/red and she was concerned for blood in her emesis. She has not vomited since. Patient states that she wants to be tested for COVID.  Of note, patient is a poor historian and gives little detail without being prompted about complaints that she voiced at [facility] this am.   PMH, Surgical Hx, Family Hx, Social History reviewed and updated as below.  Past Medical History:  Diagnosis Date  . ADHD   . Migraine   . Mood disorder (HCC)   . Seizures [facility](HCC)     Patient Active Problem List   Diagnosis Date Noted  . Mood disorder (HCC) 05/13/2019  . Supervision of high risk pregnancy, antepartum 05/01/2019  . Obesity affecting pregnancy 05/01/2019  . BMI 34.0-34.9,adult 05/01/2019  . Seizure disorder during pregnancy (HCC) 05/01/2019  . Family problems 08/07/2014  . Problems with learning 08/07/2014  . Migraine 01/28/2013  . Seizure disorder, 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/28/2013  . ADHD (attention deficit hyperactivity disorder) 08/14/2012  . Developmental delay 08/14/2012    History reviewed. No pertinent surgical history.  OB History    Gravida  1   Para      Term      Preterm      AB      Living        SAB      TAB      Ectopic      Multiple      Live Births               Home Medications    Prior to Admission medications   Medication Sig Start Date End Date Taking? Authorizing Provider  prenatal vitamin w/FE, FA (PRENATAL 1 + 1) 27-1 MG TABS tablet Take 1 tablet by mouth daily at 12 noon.   Yes [provider]  promethazine (PHENERGAN) 12.5 MG tablet Take 1 tablet (12.5 mg total) by mouth every 6 (six) hours as needed for nausea or vomiting. 06/13/19 07/30/19  Jene EveryKinner, Robert, MD  sertraline (ZOLOFT) 50 MG tablet Take 50 mg by mouth daily.  05/15/19 07/30/19  [provider]    Family History Family History  Problem Relation Age of Onset  . Multiple sclerosis Mother   . Hypertension Father   . Heart Problems Sister     Social History Social History   Tobacco Use  . Smoking status: Never Smoker  . Smokeless tobacco: Never Used  Substance Use Topics  . Alcohol use: Not Currently  . Drug use: Not Currently     Allergies   Patient has no known allergies.   Review of Systems Review of Systems Per HPI  Physical Exam Triage Vital  Signs ED Triage Vitals  Enc Vitals Group     BP 07/30/19 1405 100/61     Pulse Rate 07/30/19 1405 86     Resp 07/30/19 1405 18     Temp 07/30/19 1405 98.6 F (37 C)     Temp Source 07/30/19 1405 Oral     SpO2 07/30/19 1405 100 %     Weight 07/30/19 1402 200 lb (90.7 kg)     Height 07/30/19 1402 5\' 5"  (1.651 m)     Head Circumference --      Peak Flow --      Pain Score 07/30/19 1402 0     Pain Loc --      Pain Edu? --      Excl. in [place]? --    Updated Vital Signs BP 100/61 (BP Location: Left Arm)   Pulse 86   Temp 98.6 F (37 C) (Oral)   Resp 18   Ht 5\' 5"  (1.651 m)   Wt 90.7 kg   LMP 02/12/2019 (Exact Date)   SpO2 100%   BMI 33.28 kg/m   Visual Acuity Right Eye Distance:   Left Eye Distance:   Bilateral Distance:    Right Eye Near:   Left Eye Near:     Bilateral Near:     Physical Exam Vitals signs and nursing note reviewed.  Constitutional:      General: She is not in acute distress.    Appearance: Normal appearance. She is not ill-appearing.  HENT:     Head: Normocephalic and atraumatic.     Nose: Nose normal. No rhinorrhea.     Mouth/Throat:     Pharynx: Oropharynx is clear. No oropharyngeal exudate.  Eyes:     General:        Right eye: No discharge.        Left eye: No discharge.     Conjunctiva/sclera: Conjunctivae normal.  Cardiovascular:     Rate and Rhythm: Normal rate and regular rhythm.  Pulmonary:     Effort: Pulmonary effort is normal.     Breath sounds: Normal breath sounds. No wheezing, rhonchi or rales.  Skin:    General: Skin is warm.     Findings: No rash.  Neurological:     Mental Status: She is alert.  Psychiatric:        Mood and Affect: Mood normal.        Behavior: Behavior normal.    UC Treatments / Results  Labs (all labs ordered are listed, but only abnormal results are displayed) Labs Reviewed  RAPID STREP SCREEN ([facility] ONLY)  CULTURE, GROUP A STREP ([place])  NOVEL CORONAVIRUS, NAA (HOSP ORDER, SEND-OUT TO REF LAB; TAT 18-24 HRS)    EKG   Radiology No results found.  Procedures Procedures (including critical care time)  Medications Ordered in UC Medications - No data to display  Initial Impression / Assessment and Plan / UC Course  I have reviewed the triage vital signs and the nursing notes.  Pertinent labs & imaging results that were available during my care of the patient were reviewed by me and considered in my medical decision making (see chart for details).    23 year old female presents with recent exposure to COVID-19 and multiple complaints. She is well appearing with an unremarkable exam.  Mild anemia on labs obtained in the ER (before being seen). Labs otherwise unremarkable. No evidence of cardiac issues or PE. Awaiting COVID test. Work note given. Patient  to  follow up with OB.  Final Clinical Impressions(s) / UC Diagnoses   Final diagnoses:  Exposure to Covid-19 Virus     Discharge Instructions     Please follow up with your OB.  Lots of fluids.  Take care  Dr. Adriana Simas    ED Prescriptions    None     Controlled Substance Prescriptions [place] Controlled Substance Registry consulted? Not Applicable   Tommie Sams, [place] 07/31/19 [phone]

## 2019-08-02 LAB — CULTURE, GROUP A STREP (THRC)

## 2019-08-14 ENCOUNTER — Other Ambulatory Visit: Payer: Self-pay

## 2019-08-14 ENCOUNTER — Ambulatory Visit (INDEPENDENT_AMBULATORY_CARE_PROVIDER_SITE_OTHER): Payer: Medicaid Other | Admitting: Obstetrics and Gynecology

## 2019-08-14 DIAGNOSIS — O26892 Other specified pregnancy related conditions, second trimester: Secondary | ICD-10-CM | POA: Diagnosis not present

## 2019-08-14 DIAGNOSIS — Z3A22 22 weeks gestation of pregnancy: Secondary | ICD-10-CM | POA: Diagnosis not present

## 2019-08-14 DIAGNOSIS — J0100 Acute maxillary sinusitis, unspecified: Secondary | ICD-10-CM

## 2019-08-14 MED ORDER — AMOXICILLIN-POT CLAVULANATE 875-125 MG PO TABS: 1.0000 | ORAL_TABLET | Freq: Two times a day (BID) | ORAL | 0 refills | Status: AC

## 2019-08-14 NOTE — Progress Notes (Signed)
Telephone visit today

## 2019-08-14 NOTE — Progress Notes (Signed)
Routine Prenatal Care Visit- Virtual Visit  Subjective   Virtual Visit via Telephone Note  I connected with Sarah HarrowVictoria S Shimamoto on 08/15/19 at  4:10 PM EDT by telephone and verified that I am speaking with the correct person using two identifiers.   I discussed the limitations, risks, security and privacy concerns of performing an evaluation and management service by telephone and the availability of in person appointments. I also discussed with the patient that there may be a patient responsible charge related to this service. The patient expressed understanding and agreed to proceed.  The patient was at home I spoke with the patient from my  office The names of people involved in this encounter were: Dr. Jerene PitchSchuman and Sarah Gomez  Sarah HarrowVictoria S Vanepps is a 23 y.o. G1P0 at 7632w4d being seen today for ongoing prenatal care.  She is currently monitored for the following issues for this low-risk pregnancy and has Supervision of high risk pregnancy, antepartum; Obesity affecting pregnancy; BMI 34.0-34.9,adult; Seizure disorder during pregnancy [facility](HCC); ADHD (attention deficit hyperactivity disorder); Developmental delay; Family problems; Migraine; Mood disorder (HCC); Problems with learning; and Seizure disorder, complex partial (HCC) on their problem list.  ----------------------------------------------------------------------------------- Patient reports she has had 3 days of nasal congestion and is starting to have facial pain and headaches. Denies fevers.  Contractions: Not present. Vag. Bleeding: None.  Movement: Present. Denies leaking of fluid.  ----------------------------------------------------------------------------------- The following portions of the patient's history were reviewed and updated as appropriate: allergies, current medications, past family history, past medical history, past social history, past surgical history and problem list. Problem list updated.   Objective  Last  menstrual period 02/12/2019. Pregravid weight 205 lb (93 kg) Total Weight Gain -7 lb (-3.175 kg) Urinalysis:      Fetal Status:     Movement: Present     Physical Exam could not be performed. Because of the COVID-19 outbreak this visit was performed over the phone and not in person.   Assessment   23 y.o. G1P0 at 2332w4d by  12/14/2019, by Ultrasound presenting for routine prenatal visit  Plan   FIRST Problems (from 04/30/19 to present)    Problem Noted Resolved   Supervision of high risk pregnancy, antepartum 05/01/2019 by Conard NovakJackson, Stephen D, MD No   Overview Addendum 07/17/2019  4:49 PM by Oswaldo ConroySchmid, Jacelyn Y, CNM    Clinic [facility] Prenatal Labs  Dating 7 week ultrasound Blood type: O/Positive/-- (05/28 1000)   Genetic Screen NIPS: Negative XX Antibody:Negative (05/28 1000)  Anatomic US Complete 07/17/2019 Rubella: 1.45 (05/28 1000) Varicella: Non-immune  GTT Early:               Third trimester:  RPR: Non Reactive (05/28 1000)   Rhogam N/A HBsAg: Negative (05/28 1000)   TDaP vaccine                       Flu Shot: HIV: Non Reactive (05/28 1000)   Baby Food                                GBS:   Contraception  Pap:  CBB     CS/VBAC    Support Person              Obesity affecting pregnancy 05/01/2019 by Conard NovakJackson, Stephen D, MD No   BMI 34.0-34.9,adult 05/01/2019 by Conard NovakJackson, Stephen D, MD No   Seizure disorder during pregnancy (  [employer]) 05/01/2019 by Will Bonnet, MD No   Overview Signed 05/01/2019  9:59 AM by Will Bonnet, MD    Not on meds. Has been many years since her last one.          Gestational age appropriate obstetric precautions including but not limited to vaginal bleeding, contractions, leaking of fluid and fetal movement were reviewed in detail with the patient.    Sent RX for sinusitis   Follow Up Instructions: 2 weeks.   I discussed the assessment and treatment plan with the patient. The patient was provided an opportunity to ask questions and all were  answered. The patient agreed with the plan and demonstrated an understanding of the instructions.   The patient was advised to call back or seek an in-person evaluation if the symptoms worsen or if the condition fails to improve as anticipated.  I provided 15 minutes of non-face-to-face time during this encounter.  Return in about 2 weeks (around 08/28/2019) for ROB.  Adrian Prows MD [facility] 08/15/2019 1:25 PM

## 2019-08-20 ENCOUNTER — Emergency Department: Payer: BC Managed Care – PPO

## 2019-08-20 ENCOUNTER — Emergency Department
Admission: EM | Admit: 2019-08-20 | Discharge: 2019-08-20 | Disposition: A | Discharge status: Home / Self Care | Payer: BC Managed Care – PPO | Attending: Emergency Medicine | Admitting: Emergency Medicine

## 2019-08-20 ENCOUNTER — Other Ambulatory Visit: Payer: Self-pay

## 2019-08-20 DIAGNOSIS — Z79899 Other long term (current) drug therapy: Secondary | ICD-10-CM | POA: Insufficient documentation

## 2019-08-20 DIAGNOSIS — Y939 Activity, unspecified: Secondary | ICD-10-CM | POA: Insufficient documentation

## 2019-08-20 DIAGNOSIS — Y999 Unspecified external cause status: Secondary | ICD-10-CM | POA: Insufficient documentation

## 2019-08-20 DIAGNOSIS — M545 Low back pain, unspecified: Secondary | ICD-10-CM

## 2019-08-20 DIAGNOSIS — Y92410 Unspecified street and highway as the place of occurrence of the external cause: Secondary | ICD-10-CM | POA: Diagnosis not present

## 2019-08-20 DIAGNOSIS — Z3A23 23 weeks gestation of pregnancy: Secondary | ICD-10-CM | POA: Diagnosis not present
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
## 2019-08-20 NOTE — ED Notes (Signed)
Patient transported to Ultrasound 
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
## 2019-08-20 NOTE — ED Notes (Signed)
EDP at bedside at this time.  

## 2019-08-29 ENCOUNTER — Ambulatory Visit (INDEPENDENT_AMBULATORY_CARE_PROVIDER_SITE_OTHER): Payer: Medicaid Other | Admitting: Obstetrics and Gynecology

## 2019-08-29 ENCOUNTER — Other Ambulatory Visit: Payer: Self-pay

## 2019-08-29 ENCOUNTER — Encounter: Payer: Self-pay | Admitting: Obstetrics and Gynecology

## 2019-08-29 VITALS — BP 110/80 | Wt 208.0 lb

## 2019-08-29 DIAGNOSIS — O99352 Diseases of the nervous system complicating pregnancy, second trimester: Secondary | ICD-10-CM

## 2019-08-29 DIAGNOSIS — Z113 Encounter for screening for infections with a predominantly sexual mode of transmission: Secondary | ICD-10-CM

## 2019-08-29 DIAGNOSIS — Z131 Encounter for screening for diabetes mellitus: Secondary | ICD-10-CM

## 2019-08-29 DIAGNOSIS — Z3A24 24 weeks gestation of pregnancy: Secondary | ICD-10-CM

## 2019-08-29 DIAGNOSIS — G40909 Epilepsy, unspecified, not intractable, without status epilepticus: Secondary | ICD-10-CM

## 2019-08-29 DIAGNOSIS — O99212 Obesity complicating pregnancy, second trimester: Secondary | ICD-10-CM

## 2019-08-29 DIAGNOSIS — O0990 Supervision of high risk pregnancy, unspecified, unspecified trimester: Secondary | ICD-10-CM

## 2019-08-29 DIAGNOSIS — Z6834 Body mass index (BMI) 34.0-34.9, adult: Secondary | ICD-10-CM

## 2019-08-29 NOTE — Progress Notes (Signed)
Routine Prenatal Care Visit  Subjective  Sarah Gomez is a 23 y.o. G1P0 at 108w5d being seen today for ongoing prenatal care.  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  ----------------------------------------------------------------------------------- Patient reports no complaints.   Contractions: Not present. Vag. Bleeding: None.  Movement: Present. Leaking Fluid denies.  ----------------------------------------------------------------------------------- The following portions of the patient's history were reviewed and updated as appropriate: allergies, current medications, past family history, past medical history, past social history, past surgical history and problem list. Problem list updated.  Objective  Blood pressure 110/80, weight 208 lb (94.3 kg), last menstrual period 02/12/2019. Pregravid weight 205 lb (93 kg) Total Weight Gain 3 lb (1.361 kg) Urinalysis: Urine Protein    Urine Glucose    Fetal Status: Fetal Heart Rate (bpm): 145 Fundal Height: 24 cm Movement: Present     General:  Alert, oriented and cooperative. Patient is in no acute distress.  Skin: Skin is warm and dry. No rash noted.   Cardiovascular: Normal heart rate noted  Respiratory: Normal respiratory effort, no problems with respiration noted  Abdomen: Soft, gravid, appropriate for gestational age. Pain/Pressure: Absent     Pelvic:  Cervical exam deferred        Extremities: Normal range of motion.  Edema: None  Mental Status: Normal mood and affect. Normal behavior. Normal judgment and thought content.   Assessment   23  y.o. G1P0 at [redacted]w[redacted]d by  12/14/2019, by Ultrasound presenting for routine prenatal visit  Plan   FIRST Problems (from 04/30/19 to present)    Problem Noted Resolved   Supervision of high risk pregnancy, antepartum 05/01/2019 by Will Bonnet, MD No   Overview Addendum 07/17/2019  4:49 PM by Rexene Agent, [place] Prenatal Labs  Dating 7 week ultrasound Blood type: O/Positive/-- (05/28 1000)   Genetic Screen NIPS: Negative XX Antibody:Negative (05/28 1000)  Anatomic [place] Complete 07/17/2019 Rubella: 1.45 (05/28 1000) Varicella: Non-immune  GTT Early:               Third trimester:  RPR: Non Reactive (05/28 1000)   Rhogam N/A HBsAg: Negative (05/28 1000)   TDaP vaccine                       Flu Shot: HIV: Non Reactive (05/28 1000)   Baby Food                                GBS:   Contraception  Pap:  CBB     CS/VBAC    Support Person              Obesity affecting pregnancy 05/01/2019 by Will Bonnet, MD No   BMI 34.0-34.9,adult 05/01/2019 by Will Bonnet, MD No   Seizure disorder during pregnancy [facility]) 05/01/2019 by Will Bonnet, MD No   Overview Signed 05/01/2019  9:59 AM by Will Bonnet, MD    Not on meds. Has been many years since her last one.          Preterm labor symptoms and general obstetric precautions including but not limited to vaginal bleeding,  contractions, leaking of fluid and fetal movement were reviewed in detail with the patient. Please refer to After Visit Summary for other counseling recommendations.   - Declines flu vaccine  Return in about 3 weeks (around 09/19/2019) for 28 week labs and routine prenatal.  [name], MD, [name], [facility] 08/29/2019 4:11 PM

## 2019-09-13 ENCOUNTER — Other Ambulatory Visit: Payer: Self-pay

## 2019-09-13 ENCOUNTER — Observation Stay
Admission: EM | Admit: 2019-09-13 | Discharge: 2019-09-13 | Disposition: A | Discharge status: Home / Self Care | Payer: BC Managed Care – PPO | Attending: Obstetrics & Gynecology | Admitting: Obstetrics & Gynecology

## 2019-09-13 DIAGNOSIS — F909 Attention-deficit hyperactivity disorder, unspecified type: Secondary | ICD-10-CM | POA: Diagnosis not present

## 2019-09-13 DIAGNOSIS — Z3A27 27 weeks gestation of pregnancy: Secondary | ICD-10-CM | POA: Diagnosis not present

## 2019-09-13 DIAGNOSIS — E669 Obesity, unspecified: Secondary | ICD-10-CM | POA: Diagnosis not present

## 2019-09-13 DIAGNOSIS — R109 Unspecified abdominal pain: Secondary | ICD-10-CM | POA: Insufficient documentation

## 2019-09-13 DIAGNOSIS — O26892 Other specified pregnancy related conditions, second trimester: Secondary | ICD-10-CM | POA: Diagnosis not present

## 2019-09-13 DIAGNOSIS — O99342 Other mental disorders complicating pregnancy, second trimester: Secondary | ICD-10-CM | POA: Diagnosis not present

## 2019-09-13 DIAGNOSIS — O99212 Obesity complicating pregnancy, second trimester: Secondary | ICD-10-CM | POA: Diagnosis not present

## 2019-09-13 DIAGNOSIS — Z6834 Body mass index (BMI) 34.0-34.9, adult: Secondary | ICD-10-CM

## 2019-09-13 DIAGNOSIS — Z79899 Other long term (current) drug therapy: Secondary | ICD-10-CM | POA: Diagnosis not present

## 2019-09-13 DIAGNOSIS — Y92410 Unspecified street and highway as the place of occurrence of the external cause: Secondary | ICD-10-CM | POA: Insufficient documentation

## 2019-09-13 DIAGNOSIS — O99352 Diseases of the nervous system complicating pregnancy, second trimester: Secondary | ICD-10-CM

## 2019-09-13 DIAGNOSIS — O0990 Supervision of high risk pregnancy, unspecified, unspecified trimester: Secondary | ICD-10-CM

## 2019-09-13 DIAGNOSIS — S8011XA Contusion of right lower leg, initial encounter: Secondary | ICD-10-CM | POA: Diagnosis present

## 2019-09-13 DIAGNOSIS — G40909 Epilepsy, unspecified, not intractable, without status epilepticus: Secondary | ICD-10-CM

## 2019-09-13 HISTORY — DX: Epilepsy, unspecified, not intractable, without status epilepticus: G40.909

## 2019-09-13 LAB — CBC
HCT: 32.9 % — ABNORMAL LOW (ref 36.0–46.0)
Hemoglobin: 11 g/dL — ABNORMAL LOW (ref 12.0–15.0)
MCH: 28.6 pg (ref 26.0–34.0)
MCHC: 33.4 g/dL (ref 30.0–36.0)
MCV: 85.7 fL (ref 80.0–100.0)
Platelets: 249 10*3/uL (ref 150–400)
RBC: 3.84 MIL/uL — ABNORMAL LOW (ref 3.87–5.11)
RDW: 11.9 % (ref 11.5–15.5)
WBC: 10 10*3/uL (ref 4.0–10.5)
nRBC: 0 % (ref 0.0–0.2)

## 2019-09-13 LAB — GLUCOSE, CAPILLARY: Glucose-Capillary: 69 mg/dL — ABNORMAL LOW (ref 70–99)

## 2019-09-13 LAB — PROTIME-INR
INR: 1 (ref 0.8–1.2)
Prothrombin Time: 12.7 seconds (ref 11.4–15.2)

## 2019-09-13 LAB — TYPE AND SCREEN
ABO/RH(D): O POS
Antibody Screen: NEGATIVE

## 2019-09-13 LAB — KLEIHAUER-BETKE STAIN
Fetal Cells %: 0 %
Quantitation Fetal Hemoglobin: 0 mL

## 2019-09-13 LAB — APTT: aPTT: 29 seconds (ref 24–36)

## 2019-09-13 MED ORDER — ACETAMINOPHEN 325 MG PO TABS
650.0000 mg | ORAL_TABLET | ORAL | Status: DC | PRN
  Administered 2019-09-13: 325 mg via ORAL
  Filled 2019-09-13: qty 2

## 2019-09-13 NOTE — ED Notes (Signed)
Ice pack applied to right lower leg
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
## 2019-09-13 NOTE — Discharge Summary (Signed)
RN reviewed discharge instructions with patient. Gave patient opportunity for questions. All questions answered at this time. Pt verbalized understanding. Pt discharged home with her mother. 
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
## 2019-09-13 NOTE — Discharge Summary (Signed)
  See FPN 
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
## 2019-09-17 ENCOUNTER — Other Ambulatory Visit: Payer: Self-pay

## 2019-09-17 ENCOUNTER — Ambulatory Visit (INDEPENDENT_AMBULATORY_CARE_PROVIDER_SITE_OTHER): Payer: BC Managed Care – PPO | Admitting: Obstetrics & Gynecology

## 2019-09-17 ENCOUNTER — Encounter: Payer: Self-pay | Admitting: Obstetrics & Gynecology

## 2019-09-17 DIAGNOSIS — Z3A27 27 weeks gestation of pregnancy: Secondary | ICD-10-CM

## 2019-09-17 DIAGNOSIS — O98212 Gonorrhea complicating pregnancy, second trimester: Secondary | ICD-10-CM
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
## 2019-09-19 ENCOUNTER — Other Ambulatory Visit: Payer: Self-pay

## 2019-09-19 ENCOUNTER — Ambulatory Visit (INDEPENDENT_AMBULATORY_CARE_PROVIDER_SITE_OTHER): Payer: BC Managed Care – PPO | Admitting: Obstetrics and Gynecology

## 2019-09-19 ENCOUNTER — Other Ambulatory Visit: Payer: BC Managed Care – PPO

## 2019-09-19 ENCOUNTER — Encounter: Payer: Self-pay | Admitting: Obstetrics and Gynecology

## 2019-09-19 VITALS — BP 118/74

## 2019-09-19 DIAGNOSIS — O0990 Supervision of high risk pregnancy, unspecified, unspecified trimester: Secondary | ICD-10-CM

## 2019-09-19 DIAGNOSIS — G40909 Epilepsy, unspecified, not intractable, without status epilepticus: Secondary | ICD-10-CM

## 2019-09-19 DIAGNOSIS — Z131 Encounter for screening for diabetes mellitus: Secondary | ICD-10-CM

## 2019-09-19 DIAGNOSIS — Z6834 Body mass index (BMI) 34.0-34.9, adult: Secondary | ICD-10-CM

## 2019-09-19 DIAGNOSIS — O99213 Obesity complicating pregnancy, third trimester: Secondary | ICD-10-CM

## 2019-09-19 DIAGNOSIS — O0993 Supervision of high risk pregnancy, unspecified, third trimester: Secondary | ICD-10-CM

## 2019-09-19 DIAGNOSIS — Z113 Encounter for screening for infections with a predominantly sexual mode of transmission: Secondary | ICD-10-CM

## 2019-09-19 DIAGNOSIS — Z3A27 27 weeks gestation of pregnancy: Secondary | ICD-10-CM

## 2019-09-19 DIAGNOSIS — O99353 Diseases of the nervous system complicating pregnancy, third trimester: Secondary | ICD-10-CM
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
## 2019-09-20 LAB — 28 WEEK RH+PANEL
Basophils Absolute: 0 10*3/uL (ref 0.0–0.2)
Basos: 1 %
EOS (ABSOLUTE): 0.1 10*3/uL (ref 0.0–0.4)
Eos: 1 %
Gestational Diabetes Screen: 99 mg/dL (ref 65–139)
HIV Screen 4th Generation wRfx: NONREACTIVE
Hematocrit: 29.9 % — ABNORMAL LOW (ref 34.0–46.6)
Hemoglobin: 10.1 g/dL — ABNORMAL LOW (ref 11.1–15.9)
Immature Grans (Abs): 0.1 10*3/uL (ref 0.0–0.1)
Immature Granulocytes: 2 %
Lymphocytes Absolute: 1.8 10*3/uL (ref 0.7–3.1)
Lymphs: 21 %
MCH: 28.9 pg (ref 26.6–33.0)
MCHC: 33.8 g/dL (ref 31.5–35.7)
MCV: 85 fL (ref 79–97)
Monocytes Absolute: 0.6 10*3/uL (ref 0.1–0.9)
Monocytes: 7 %
Neutrophils Absolute: 5.8 10*3/uL (ref 1.4–7.0)
Neutrophils: 68 %
Platelets: 216 10*3/uL (ref 150–450)
RBC: 3.5 x10E6/uL — ABNORMAL LOW (ref 3.77–5.28)
RDW: 11.8 % (ref 11.7–15.4)
RPR Ser Ql: NONREACTIVE
WBC: 8.5 10*3/uL (ref 3.4–10.8)

## 2019-09-26 ENCOUNTER — Telehealth: Payer: Self-pay

## 2019-09-26 NOTE — Telephone Encounter (Signed)
-----   Message from Will Bonnet, MD sent at 09/26/2019  1:24 PM EDT ----- Would you mind calling this patient and letting her now that she passed her glucose test. However, she is a little anemic and needs to take iron sulfate 325 mg twice daily with meals.  She can purchase this over the counter. Thank you!

## 2019-09-26 NOTE — Telephone Encounter (Signed)
Aware via vm . Please let me know if pt calls back.

## 2019-09-26 NOTE — Progress Notes (Signed)
Would you mind calling this patient and letting her now that she passed her glucose test. However, she is a little anemic and needs to take iron sulfate 325 mg twice daily with meals.  She can purchase this over the counter. Thank you!

## 2019-09-29 ENCOUNTER — Telehealth: Payer: Self-pay
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
## 2019-10-03 ENCOUNTER — Encounter: Payer: BC Managed Care – PPO | Admitting: Obstetrics and Gynecology

## 2019-10-14 ENCOUNTER — Ambulatory Visit (INDEPENDENT_AMBULATORY_CARE_PROVIDER_SITE_OTHER): Payer: BC Managed Care – PPO | Admitting: Maternal Newborn

## 2019-10-14 ENCOUNTER — Other Ambulatory Visit: Payer: Self-pay

## 2019-10-14 VITALS — BP 118/78 | Wt 209.0 lb

## 2019-10-14 DIAGNOSIS — O0993 Supervision of high risk pregnancy, unspecified, third trimester: Secondary | ICD-10-CM

## 2019-10-14 DIAGNOSIS — O0990 Supervision of high risk pregnancy, unspecified, unspecified trimester: Secondary | ICD-10-CM

## 2019-10-14 DIAGNOSIS — O99213 Obesity complicating pregnancy, third trimester: Secondary | ICD-10-CM

## 2019-10-14 DIAGNOSIS — Z23 Encounter for immunization: Secondary | ICD-10-CM

## 2019-10-14 DIAGNOSIS — Z3A31 31 weeks gestation of pregnancy: Secondary | ICD-10-CM

## 2019-10-14 NOTE — Progress Notes (Signed)
No vb. No lof. Some pressure.  

## 2019-10-14 NOTE — Patient Instructions (Signed)
Third Trimester of Pregnancy The third trimester is from week 28 through week 40 (months 7 through 9). The third trimester is a time when the unborn baby (fetus) is growing rapidly. At the end of the ninth month, the fetus is about 20 inches in length and weighs 6-10 pounds. Body changes during your third trimester Your body will continue to go through many changes during pregnancy. The changes vary from woman to woman. During the third trimester:  Your weight will continue to increase. You can expect to gain 25-35 pounds (11-16 kg) by the end of the pregnancy.  You may begin to get stretch marks on your hips, abdomen, and breasts.  You may urinate more often because the fetus is moving lower into your pelvis and pressing on your bladder.  You may develop or continue to have heartburn. This is caused by increased hormones that slow down muscles in the digestive tract.  You may develop or continue to have constipation because increased hormones slow digestion and cause the muscles that push waste through your intestines to relax.  You may develop hemorrhoids. These are swollen veins (varicose veins) in the rectum that can itch or be painful.  You may develop swollen, bulging veins (varicose veins) in your legs.  You may have increased body aches in the pelvis, back, or thighs. This is due to weight gain and increased hormones that are relaxing your joints.  You may have changes in your hair. These can include thickening of your hair, rapid growth, and changes in texture. Some women also have hair loss during or after pregnancy, or hair that feels dry or thin. Your hair will most likely return to normal after your baby is born.  Your breasts will continue to grow and they will continue to become tender. A yellow fluid (colostrum) may leak from your breasts. This is the first milk you are producing for your baby.  Your belly button may stick out.  You may notice more swelling in your hands,  face, or ankles.  You may have increased tingling or numbness in your hands, arms, and legs. The skin on your belly may also feel numb.  You may feel short of breath because of your expanding uterus.  You may have more problems sleeping. This can be caused by the size of your belly, increased need to urinate, and an increase in your body's metabolism.  You may notice the fetus "dropping," or moving lower in your abdomen (lightening).  You may have increased vaginal discharge.  You may notice your joints feel loose and you may have pain around your pelvic bone. What to expect at prenatal visits You will have prenatal exams every 2 weeks until week 36. Then you will have weekly prenatal exams. During a routine prenatal visit:  You will be weighed to make sure you and the baby are growing normally.  Your blood pressure will be taken.  Your abdomen will be measured to track your baby's growth.  The fetal heartbeat will be listened to.  Any test results from the previous visit will be discussed.  You may have a cervical check near your due date to see if your cervix has softened or thinned (effaced).  You will be tested for Group B streptococcus. This happens between 35 and 37 weeks. Your health care provider may ask you:  What your birth plan is.  How you are feeling.  If you are feeling the baby move.  If you have had any abnormal   symptoms, such as leaking fluid, bleeding, severe headaches, or abdominal cramping.  If you are using any tobacco products, including cigarettes, chewing tobacco, and electronic cigarettes.  If you have any questions. Other tests or screenings that may be performed during your third trimester include:  Blood tests that check for low iron levels (anemia).  Fetal testing to check the health, activity level, and growth of the fetus. Testing is done if you have certain medical conditions or if there are problems during the pregnancy.  Nonstress test  (NST). This test checks the health of your baby to make sure there are no signs of problems, such as the baby not getting enough oxygen. During this test, a belt is placed around your belly. The baby is made to move, and its heart rate is monitored during movement. What is false labor? False labor is a condition in which you feel small, irregular tightenings of the muscles in the womb (contractions) that usually go away with rest, changing position, or drinking water. These are called Braxton Hicks contractions. Contractions may last for hours, days, or even weeks before true labor sets in. If contractions come at regular intervals, become more frequent, increase in intensity, or become painful, you should see your health care provider. What are the signs of labor?  Abdominal cramps.  Regular contractions that start at 10 minutes apart and become stronger and more frequent with time.  Contractions that start on the top of the uterus and spread down to the lower abdomen and back.  Increased pelvic pressure and dull back pain.  A watery or bloody mucus discharge that comes from the vagina.  Leaking of amniotic fluid. This is also known as your "water breaking." It could be a slow trickle or a gush. Let your health care provider know if it has a color or strange odor. If you have any of these signs, call your health care provider right away, even if it is before your due date. Follow these instructions at home: Medicines  Follow your health care provider's instructions regarding medicine use. Specific medicines may be either safe or unsafe to take during pregnancy.  Take a prenatal vitamin that contains at least 600 micrograms (mcg) of folic acid.  If you develop constipation, try taking a stool softener if your health care provider approves. Eating and drinking   Eat a balanced diet that includes fresh fruits and vegetables, whole grains, good sources of protein such as meat, eggs, or tofu,  and low-fat dairy. Your health care provider will help you determine the amount of weight gain that is right for you.  Avoid raw meat and uncooked cheese. These carry germs that can cause birth defects in the baby.  If you have low calcium intake from food, talk to your health care provider about whether you should take a daily calcium supplement.  Eat four or five small meals rather than three large meals a day.  Limit foods that are high in fat and processed sugars, such as fried and sweet foods.  To prevent constipation: ? Drink enough fluid to keep your urine clear or pale yellow. ? Eat foods that are high in fiber, such as fresh fruits and vegetables, whole grains, and beans. Activity  Exercise only as directed by your health care provider. Most women can continue their usual exercise routine during pregnancy. Try to exercise for 30 minutes at least 5 days a week. Stop exercising if you experience uterine contractions.  Avoid heavy lifting.  Do   not exercise in extreme heat or humidity, or at high altitudes.  Wear low-heel, comfortable shoes.  Practice good posture.  You may continue to have sex unless your health care provider tells you otherwise. Relieving pain and discomfort  Take frequent breaks and rest with your legs elevated if you have leg cramps or low back pain.  Take warm sitz baths to soothe any pain or discomfort caused by hemorrhoids. Use hemorrhoid cream if your health care provider approves.  Wear a good support bra to prevent discomfort from breast tenderness.  If you develop varicose veins: ? Wear support pantyhose or compression stockings as told by your healthcare provider. ? Elevate your feet for 15 minutes, 3-4 times a day. Prenatal care  Write down your questions. Take them to your prenatal visits.  Keep all your prenatal visits as told by your health care provider. This is important. Safety  Wear your seat belt at all times when driving.  Make  a list of emergency phone numbers, including numbers for family, friends, the hospital, and police and fire departments. General instructions  Avoid cat litter boxes and soil used by cats. These carry germs that can cause birth defects in the baby. If you have a cat, ask someone to clean the litter box for you.  Do not travel far distances unless it is absolutely necessary and only with the approval of your health care provider.  Do not use hot tubs, steam rooms, or saunas.  Do not drink alcohol.  Do not use any products that contain nicotine or tobacco, such as cigarettes and e-cigarettes. If you need help quitting, ask your health care provider.  Do not use any medicinal herbs or unprescribed drugs. These chemicals affect the formation and growth of the baby.  Do not douche or use tampons or scented sanitary pads.  Do not cross your legs for long periods of time.  To prepare for the arrival of your baby: ? Take prenatal classes to understand, practice, and ask questions about labor and delivery. ? Make a trial run to the hospital. ? Visit the hospital and tour the maternity area. ? Arrange for maternity or paternity leave through employers. ? Arrange for family and friends to take care of pets while you are in the hospital. ? Purchase a rear-facing car seat and make sure you know how to install it in your car. ? Pack your hospital bag. ? Prepare the baby's nursery. Make sure to remove all pillows and stuffed animals from the baby's crib to prevent suffocation.  Visit your dentist if you have not gone during your pregnancy. Use a soft toothbrush to brush your teeth and be gentle when you floss. Contact a health care provider if:  You are unsure if you are in labor or if your water has broken.  You become dizzy.  You have mild pelvic cramps, pelvic pressure, or nagging pain in your abdominal area.  You have lower back pain.  You have persistent nausea, vomiting, or diarrhea.   You have an unusual or bad smelling vaginal discharge.  You have pain when you urinate. Get help right away if:  Your water breaks before 37 weeks.  You have regular contractions less than 5 minutes apart before 37 weeks.  You have a fever.  You are leaking fluid from your vagina.  You have spotting or bleeding from your vagina.  You have severe abdominal pain or cramping.  You have rapid weight loss or weight gain.  You have   shortness of breath with chest pain.  You notice sudden or extreme swelling of your face, hands, ankles, feet, or legs.  Your baby makes fewer than 10 movements in 2 hours.  You have severe headaches that do not go away when you take medicine.  You have vision changes. Summary  The third trimester is from week 28 through week 40, months 7 through 9. The third trimester is a time when the unborn baby (fetus) is growing rapidly.  During the third trimester, your discomfort may increase as you and your baby continue to gain weight. You may have abdominal, leg, and back pain, sleeping problems, and an increased need to urinate.  During the third trimester your breasts will keep growing and they will continue to become tender. A yellow fluid (colostrum) may leak from your breasts. This is the first milk you are producing for your baby.  False labor is a condition in which you feel small, irregular tightenings of the muscles in the womb (contractions) that eventually go away. These are called Braxton Hicks contractions. Contractions may last for hours, days, or even weeks before true labor sets in.  Signs of labor can include: abdominal cramps; regular contractions that start at 10 minutes apart and become stronger and more frequent with time; watery or bloody mucus discharge that comes from the vagina; increased pelvic pressure and dull back pain; and leaking of amniotic fluid. This information is not intended to replace advice given to you by your health  care provider. Make sure you discuss any questions you have with your health care provider. Document Released: 11/14/2001 Document Revised: 03/13/2019 Document Reviewed: 12/26/2016 Elsevier Patient Education  2020 [employer]  
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
## 2019-10-28 ENCOUNTER — Encounter: Payer: Self-pay | Admitting: Advanced Practice Midwife

## 2019-10-28 ENCOUNTER — Ambulatory Visit (INDEPENDENT_AMBULATORY_CARE_PROVIDER_SITE_OTHER): Payer: BC Managed Care – PPO | Admitting: Advanced Practice Midwife

## 2019-10-28 ENCOUNTER — Other Ambulatory Visit: Payer: Self-pay

## 2019-10-28 VITALS — BP 120/80 | Wt 211.0 lb

## 2019-10-28 DIAGNOSIS — O99213 Obesity complicating pregnancy, third trimester: Secondary | ICD-10-CM

## 2019-10-28 DIAGNOSIS — Z3A33 33 weeks gestation of pregnancy: Secondary | ICD-10-CM
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
## 2019-11-13 ENCOUNTER — Other Ambulatory Visit: Payer: Self-pay

## 2019-11-13 ENCOUNTER — Ambulatory Visit (INDEPENDENT_AMBULATORY_CARE_PROVIDER_SITE_OTHER): Payer: BC Managed Care – PPO | Admitting: Obstetrics and Gynecology

## 2019-11-13 ENCOUNTER — Encounter: Payer: Self-pay | Admitting: Obstetrics and Gynecology

## 2019-11-13 VITALS — BP 118/78 | Wt 214.0 lb

## 2019-11-13 DIAGNOSIS — O0993 Supervision of high risk pregnancy, unspecified, third trimester: Secondary | ICD-10-CM

## 2019-11-13 DIAGNOSIS — O0990 Supervision of high risk pregnancy, unspecified, unspecified trimester: Secondary | ICD-10-CM

## 2019-11-13 DIAGNOSIS — G40909 Epilepsy, unspecified, not intractable, without status epilepticus: Secondary | ICD-10-CM

## 2019-11-13 DIAGNOSIS — O99213 Obesity complicating pregnancy, third trimester: Secondary | ICD-10-CM

## 2019-11-13 DIAGNOSIS — Z6834 Body mass index (BMI) 34.0-34.9, adult: Secondary | ICD-10-CM

## 2019-11-13 DIAGNOSIS — Z3A35 35 weeks gestation of pregnancy: Secondary | ICD-10-CM

## 2019-11-13 DIAGNOSIS — O99353 Diseases of the nervous system complicating pregnancy, third trimester: Secondary | ICD-10-CM
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
## 2019-11-24 ENCOUNTER — Other Ambulatory Visit (HOSPITAL_COMMUNITY)
Admission: RE | Admit: 2019-11-24 | Discharge: 2019-11-24 | Disposition: A | Discharge status: Home / Self Care | Payer: BC Managed Care – PPO | Source: Ambulatory Visit | Attending: Obstetrics and Gynecology | Admitting: Obstetrics and Gynecology

## 2019-11-24 ENCOUNTER — Other Ambulatory Visit: Payer: Self-pay

## 2019-11-24 ENCOUNTER — Encounter: Payer: Self-pay | Admitting: Obstetrics and Gynecology

## 2019-11-24 ENCOUNTER — Ambulatory Visit (INDEPENDENT_AMBULATORY_CARE_PROVIDER_SITE_OTHER): Payer: BC Managed Care – PPO | Admitting: Obstetrics and Gynecology

## 2019-11-24 VITALS — BP 124/78 | Wt 215.0 lb

## 2019-11-24 DIAGNOSIS — O0993 Supervision of high risk pregnancy, unspecified, third trimester: Secondary | ICD-10-CM

## 2019-11-24 DIAGNOSIS — O0990 Supervision of high risk pregnancy, unspecified, unspecified trimester: Secondary | ICD-10-CM | POA: Diagnosis present

## 2019-11-24 DIAGNOSIS — R42 Dizziness and giddiness: Secondary | ICD-10-CM

## 2019-11-24 DIAGNOSIS — O99013 Anemia complicating pregnancy, third trimester: Secondary | ICD-10-CM

## 2019-11-24 DIAGNOSIS — Z113 Encounter for screening for infections with a predominantly sexual mode of transmission: Secondary | ICD-10-CM

## 2019-11-24 DIAGNOSIS — O26893 Other specified pregnancy related conditions, third trimester: Secondary | ICD-10-CM

## 2019-11-24 DIAGNOSIS — O99019 Anemia complicating pregnancy, unspecified trimester: Secondary | ICD-10-CM

## 2019-11-24 DIAGNOSIS — Z23 Encounter for immunization: Secondary | ICD-10-CM

## 2019-11-24 DIAGNOSIS — Z3A37 37 weeks gestation of pregnancy: Secondary | ICD-10-CM

## 2019-11-24 LAB — OB RESULTS CONSOLE GBS: GBS: NEGATIVE

## 2019-11-24 NOTE — Patient Instructions (Signed)
Third Trimester of Pregnancy The third trimester is from week 28 through week 40 (months 7 through 9). The third trimester is a time when the unborn baby (fetus) is growing rapidly. At the end of the ninth month, the fetus is about 20 inches in length and weighs 6-10 pounds. Body changes during your third trimester Your body will continue to go through many changes during pregnancy. The changes vary from woman to woman. During the third trimester:  Your weight will continue to increase. You can expect to gain 25-35 pounds (11-16 kg) by the end of the pregnancy.  You may begin to get stretch marks on your hips, abdomen, and breasts.  You may urinate more often because the fetus is moving lower into your pelvis and pressing on your bladder.  You may develop or continue to have heartburn. This is caused by increased hormones that slow down muscles in the digestive tract.  You may develop or continue to have constipation because increased hormones slow digestion and cause the muscles that push waste through your intestines to relax.  You may develop hemorrhoids. These are swollen veins (varicose veins) in the rectum that can itch or be painful.  You may develop swollen, bulging veins (varicose veins) in your legs.  You may have increased body aches in the pelvis, back, or thighs. This is due to weight gain and increased hormones that are relaxing your joints.  You may have changes in your hair. These can include thickening of your hair, rapid growth, and changes in texture. Some women also have hair loss during or after pregnancy, or hair that feels dry or thin. Your hair will most likely return to normal after your baby is born.  Your breasts will continue to grow and they will continue to become tender. A yellow fluid (colostrum) may leak from your breasts. This is the first milk you are producing for your baby.  Your belly button may stick out.  You may notice more swelling in your hands,  face, or ankles.  You may have increased tingling or numbness in your hands, arms, and legs. The skin on your belly may also feel numb.  You may feel short of breath because of your expanding uterus.  You may have more problems sleeping. This can be caused by the size of your belly, increased need to urinate, and an increase in your body's metabolism.  You may notice the fetus "dropping," or moving lower in your abdomen (lightening).  You may have increased vaginal discharge.  You may notice your joints feel loose and you may have pain around your pelvic bone. What to expect at prenatal visits You will have prenatal exams every 2 weeks until week 36. Then you will have weekly prenatal exams. During a routine prenatal visit:  You will be weighed to make sure you and the baby are growing normally.  Your blood pressure will be taken.  Your abdomen will be measured to track your baby's growth.  The fetal heartbeat will be listened to.  Any test results from the previous visit will be discussed.  You may have a cervical check near your due date to see if your cervix has softened or thinned (effaced).  You will be tested for Group B streptococcus. This happens between 35 and 37 weeks. Your health care provider may ask you:  What your birth plan is.  How you are feeling.  If you are feeling the baby move.  If you have had any abnormal   symptoms, such as leaking fluid, bleeding, severe headaches, or abdominal cramping.  If you are using any tobacco products, including cigarettes, chewing tobacco, and electronic cigarettes.  If you have any questions. Other tests or screenings that may be performed during your third trimester include:  Blood tests that check for low iron levels (anemia).  Fetal testing to check the health, activity level, and growth of the fetus. Testing is done if you have certain medical conditions or if there are problems during the pregnancy.  Nonstress test  (NST). This test checks the health of your baby to make sure there are no signs of problems, such as the baby not getting enough oxygen. During this test, a belt is placed around your belly. The baby is made to move, and its heart rate is monitored during movement. What is false labor? False labor is a condition in which you feel small, irregular tightenings of the muscles in the womb (contractions) that usually go away with rest, changing position, or drinking water. These are called Braxton Hicks contractions. Contractions may last for hours, days, or even weeks before true labor sets in. If contractions come at regular intervals, become more frequent, increase in intensity, or become painful, you should see your health care provider. What are the signs of labor?  Abdominal cramps.  Regular contractions that start at 10 minutes apart and become stronger and more frequent with time.  Contractions that start on the top of the uterus and spread down to the lower abdomen and back.  Increased pelvic pressure and dull back pain.  A watery or bloody mucus discharge that comes from the vagina.  Leaking of amniotic fluid. This is also known as your "water breaking." It could be a slow trickle or a gush. Let your health care provider know if it has a color or strange odor. If you have any of these signs, call your health care provider right away, even if it is before your due date. Follow these instructions at home: Medicines  Follow your health care provider's instructions regarding medicine use. Specific medicines may be either safe or unsafe to take during pregnancy.  Take a prenatal vitamin that contains at least 600 micrograms (mcg) of folic acid.  If you develop constipation, try taking a stool softener if your health care provider approves. Eating and drinking   Eat a balanced diet that includes fresh fruits and vegetables, whole grains, good sources of protein such as meat, eggs, or tofu,  and low-fat dairy. Your health care provider will help you determine the amount of weight gain that is right for you.  Avoid raw meat and uncooked cheese. These carry germs that can cause birth defects in the baby.  If you have low calcium intake from food, talk to your health care provider about whether you should take a daily calcium supplement.  Eat four or five small meals rather than three large meals a day.  Limit foods that are high in fat and processed sugars, such as fried and sweet foods.  To prevent constipation: ? Drink enough fluid to keep your urine clear or pale yellow. ? Eat foods that are high in fiber, such as fresh fruits and vegetables, whole grains, and beans. Activity  Exercise only as directed by your health care provider. Most women can continue their usual exercise routine during pregnancy. Try to exercise for 30 minutes at least 5 days a week. Stop exercising if you experience uterine contractions.  Avoid heavy lifting.  Do   not exercise in extreme heat or humidity, or at high altitudes.  Wear low-heel, comfortable shoes.  Practice good posture.  You may continue to have sex unless your health care provider tells you otherwise. Relieving pain and discomfort  Take frequent breaks and rest with your legs elevated if you have leg cramps or low back pain.  Take warm sitz baths to soothe any pain or discomfort caused by hemorrhoids. Use hemorrhoid cream if your health care provider approves.  Wear a good support bra to prevent discomfort from breast tenderness.  If you develop varicose veins: ? Wear support pantyhose or compression stockings as told by your healthcare provider. ? Elevate your feet for 15 minutes, 3-4 times a day. Prenatal care  Write down your questions. Take them to your prenatal visits.  Keep all your prenatal visits as told by your health care provider. This is important. Safety  Wear your seat belt at all times when driving.  Make  a list of emergency phone numbers, including numbers for family, friends, the hospital, and police and fire departments. General instructions  Avoid cat litter boxes and soil used by cats. These carry germs that can cause birth defects in the baby. If you have a cat, ask someone to clean the litter box for you.  Do not travel far distances unless it is absolutely necessary and only with the approval of your health care provider.  Do not use hot tubs, steam rooms, or saunas.  Do not drink alcohol.  Do not use any products that contain nicotine or tobacco, such as cigarettes and e-cigarettes. If you need help quitting, ask your health care provider.  Do not use any medicinal herbs or unprescribed drugs. These chemicals affect the formation and growth of the baby.  Do not douche or use tampons or scented sanitary pads.  Do not cross your legs for long periods of time.  To prepare for the arrival of your baby: ? Take prenatal classes to understand, practice, and ask questions about labor and delivery. ? Make a trial run to the hospital. ? Visit the hospital and tour the maternity area. ? Arrange for maternity or paternity leave through employers. ? Arrange for family and friends to take care of pets while you are in the hospital. ? Purchase a rear-facing car seat and make sure you know how to install it in your car. ? Pack your hospital bag. ? Prepare the baby's nursery. Make sure to remove all pillows and stuffed animals from the baby's crib to prevent suffocation.  Visit your dentist if you have not gone during your pregnancy. Use a soft toothbrush to brush your teeth and be gentle when you floss. Contact a health care provider if:  You are unsure if you are in labor or if your water has broken.  You become dizzy.  You have mild pelvic cramps, pelvic pressure, or nagging pain in your abdominal area.  You have lower back pain.  You have persistent nausea, vomiting, or diarrhea.   You have an unusual or bad smelling vaginal discharge.  You have pain when you urinate. Get help right away if:  Your water breaks before 37 weeks.  You have regular contractions less than 5 minutes apart before 37 weeks.  You have a fever.  You are leaking fluid from your vagina.  You have spotting or bleeding from your vagina.  You have severe abdominal pain or cramping.  You have rapid weight loss or weight gain.  You have   shortness of breath with chest pain.  You notice sudden or extreme swelling of your face, hands, ankles, feet, or legs.  Your baby makes fewer than 10 movements in 2 hours.  You have severe headaches that do not go away when you take medicine.  You have vision changes. Summary  The third trimester is from week 28 through week 40, months 7 through 9. The third trimester is a time when the unborn baby (fetus) is growing rapidly.  During the third trimester, your discomfort may increase as you and your baby continue to gain weight. You may have abdominal, leg, and back pain, sleeping problems, and an increased need to urinate.  During the third trimester your breasts will keep growing and they will continue to become tender. A yellow fluid (colostrum) may leak from your breasts. This is the first milk you are producing for your baby.  False labor is a condition in which you feel small, irregular tightenings of the muscles in the womb (contractions) that eventually go away. These are called Braxton Hicks contractions. Contractions may last for hours, days, or even weeks before true labor sets in.  Signs of labor can include: abdominal cramps; regular contractions that start at 10 minutes apart and become stronger and more frequent with time; watery or bloody mucus discharge that comes from the vagina; increased pelvic pressure and dull back pain; and leaking of amniotic fluid. This information is not intended to replace advice given to you by your health  care provider. Make sure you discuss any questions you have with your health care provider. Document Released: 11/14/2001 Document Revised: 03/13/2019 Document Reviewed: 12/26/2016 Elsevier Patient Education  2020 [employer]  

## 2019-11-24 NOTE — Progress Notes (Signed)
ROB C/o some dizzy spells  Denies lof, no vb, Good FM
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
## 2019-11-25 LAB — COMPREHENSIVE METABOLIC PANEL
ALT: 5 IU/L (ref 0–32)
AST: 13 IU/L (ref 0–40)
Albumin/Globulin Ratio: 1.3 (ref 1.2–2.2)
Albumin: 3.4 g/dL — ABNORMAL LOW (ref 3.9–5.0)
Alkaline Phosphatase: 152 IU/L — ABNORMAL HIGH (ref 39–117)
BUN/Creatinine Ratio: 9 (ref 9–23)
BUN: 4 mg/dL — ABNORMAL LOW (ref 6–20)
Bilirubin Total: 0.3 mg/dL (ref 0.0–1.2)
CO2: 20 mmol/L (ref 20–29)
Calcium: 9.1 mg/dL (ref 8.7–10.2)
Chloride: 106 mmol/L (ref 96–106)
Creatinine, Ser: 0.46 mg/dL — ABNORMAL LOW (ref 0.57–1.00)
GFR calc Af Amer: 162 mL/min/{1.73_m2} (ref >59)
GFR calc non Af Amer: 141 mL/min/{1.73_m2} (ref >59)
Globulin, Total: 2.6 g/dL (ref 1.5–4.5)
Glucose: 63 mg/dL — ABNORMAL LOW (ref 65–99)
Potassium: 4.3 mmol/L (ref 3.5–5.2)
Sodium: 140 mmol/L (ref 134–144)
Total Protein: 6 g/dL (ref 6.0–8.5)

## 2019-11-25 LAB — CBC
Hematocrit: 30.1 % — ABNORMAL LOW (ref 34.0–46.6)
Hemoglobin: 9.8 g/dL — ABNORMAL LOW (ref 11.1–15.9)
MCH: 24.2 pg — ABNORMAL LOW (ref 26.6–33.0)
MCHC: 32.6 g/dL (ref 31.5–35.7)
MCV: 74 fL — ABNORMAL LOW (ref 79–97)
Platelets: 283 10*3/uL (ref 150–450)
RBC: 4.05 x10E6/uL (ref 3.77–5.28)
RDW: 13.9 % (ref 11.7–15.4)
WBC: 8.9 10*3/uL (ref 3.4–10.8)

## 2019-11-25 LAB — IRON AND TIBC
Iron Saturation: 5 % — CL (ref 15–55)
Iron: 32 ug/dL (ref 27–159)
Total Iron Binding Capacity: 609 ug/dL (ref 250–450)
UIBC: 577 ug/dL — ABNORMAL HIGH (ref 131–425)

## 2019-11-25 LAB — VITAMIN B12: Vitamin B-12: 301 pg/mL (ref 232–1245)

## 2019-11-25 LAB — FERRITIN: Ferritin: 5 ng/mL — ABNORMAL LOW (ref 15–150)

## 2019-11-26 LAB — CERVICOVAGINAL ANCILLARY ONLY
Chlamydia: NEGATIVE
Comment: NEGATIVE
Comment: NEGATIVE
Comment: NORMAL
Neisseria Gonorrhea: NEGATIVE
Trichomonas: NEGATIVE

## 2019-11-27 ENCOUNTER — Other Ambulatory Visit: Payer: Self-pay | Admitting: Obstetrics and Gynecology

## 2019-11-27 DIAGNOSIS — O99013 Anemia complicating pregnancy, third trimester: Secondary | ICD-10-CM

## 2019-11-27 NOTE — Progress Notes (Signed)
Called and discussed results with patient- will refer to hematology for symptomatic anemia and low ferritin.

## 2019-11-28 LAB — CULTURE, BETA STREP (GROUP B ONLY): Strep Gp B Culture: NEGATIVE

## 2019-12-01 ENCOUNTER — Other Ambulatory Visit: Payer: Self-pay

## 2019-12-01 ENCOUNTER — Ambulatory Visit (INDEPENDENT_AMBULATORY_CARE_PROVIDER_SITE_OTHER): Payer: BC Managed Care – PPO | Admitting: Certified Nurse Midwife

## 2019-12-01 VITALS — BP 100/60 | Wt 216.0 lb

## 2019-12-01 DIAGNOSIS — Z3A38 38 weeks gestation of pregnancy: Secondary | ICD-10-CM

## 2019-12-01 DIAGNOSIS — O0990 Supervision of high risk pregnancy, unspecified, unspecified trimester: Secondary | ICD-10-CM

## 2019-12-01 DIAGNOSIS — O0993 Supervision of high risk pregnancy, unspecified, third trimester: Secondary | ICD-10-CM

## 2019-12-01 LAB — POCT URINALYSIS DIPSTICK OB
Glucose, UA: NEGATIVE
POC,PROTEIN,UA: NEGATIVE

## 2019-12-01 NOTE — Progress Notes (Signed)
C/o no concerns

## 2019-12-02 ENCOUNTER — Other Ambulatory Visit: Payer: Self-pay

## 2019-12-02 ENCOUNTER — Inpatient Hospital Stay: Payer: BC Managed Care – PPO | Attending: Oncology | Admitting: Oncology

## 2019-12-02 ENCOUNTER — Ambulatory Visit: Payer: BC Managed Care – PPO

## 2019-12-02 ENCOUNTER — Encounter: Payer: Self-pay | Admitting: Oncology

## 2019-12-02 VITALS — BP 120/74 | HR 91 | Temp 97.7°F | Resp 16 | Wt 215.1 lb

## 2019-12-02 DIAGNOSIS — D508 Other iron deficiency anemias: Secondary | ICD-10-CM

## 2019-12-02 DIAGNOSIS — Z8249 Family history of ischemic heart disease and other diseases of the circulatory system: Secondary | ICD-10-CM

## 2019-12-02 DIAGNOSIS — R5383 Other fatigue: Secondary | ICD-10-CM | POA: Diagnosis not present

## 2019-12-02 DIAGNOSIS — Z3A32 32 weeks gestation of pregnancy: Secondary | ICD-10-CM | POA: Diagnosis present

## 2019-12-02 DIAGNOSIS — D509 Iron deficiency anemia, unspecified: Secondary | ICD-10-CM

## 2019-12-02 DIAGNOSIS — O99019 Anemia complicating pregnancy, unspecified trimester: Secondary | ICD-10-CM

## 2019-12-02 HISTORY — DX: Iron deficiency anemia, unspecified: D50.9

## 2019-12-02 NOTE — Progress Notes (Signed)
Hematology/Oncology Consult note [facility] [phone] [phone]   Patient Care Team: Center, [facility] as PCP - General (General Practice) Van Clines, MD as Consulting Physician (Neurology)  REFERRING PROVIDER: Natale Milch, * CHIEF COMPLAINTS/REASON FOR VISIT:  Evaluation of iron deficiency anemia  HISTORY OF PRESENTING ILLNESS:  Sarah Gomez is a  23 y.o.  female with PMH listed below was seen in consultation at the request of Jerene Pitch, Christanna R, *   for evaluation of iron deficiency anemia.   Reviewed patient's recent labs  12/21/2020Labs revealed anemia with hemoglobin of 9.8, iron panel showed ferritin 5, iron saturation 5, TIBC 609.   Reviewed patient's previous labs ordered by primary care physician's office, anemia is chronic onset , duration is since August 2020.  Prior to pregnancy, she has a normal hemoglobin.  Associated signs and symptoms: Patient reports fatigue.  Denies SOB with exertion. Denies weight loss, easy bruising, hematochezia, hemoptysis, hematuria. Context:  History of iron deficiency: Denies Rectal bleeding: Denies Hematemesis or hemoptysis : denies Blood in urine : denies  Pica: Craves for ice chips and dirt Last endoscopy: Never had 1 Fatigue: Yes.  SOB: deneis  Patient is currently [redacted] weeks pregnant, due date is in early January.  She is accompanied by her boyfriend. Denies any pain today.  She reports that she has been taking oral iron supplementation Twice daily.  Review of Systems  Constitutional: Positive for fatigue. Negative for appetite change, chills and fever.  HENT:   Negative for hearing loss and voice change.   Eyes: Negative for eye problems.  Respiratory: Negative for chest tightness and cough.   Cardiovascular: Negative for chest pain.  Gastrointestinal: Negative for abdominal distention, abdominal pain and blood in stool.  Endocrine:  Negative for hot flashes.  Genitourinary: Negative for difficulty urinating and frequency.   Musculoskeletal: Negative for arthralgias.  Skin: Negative for itching and rash.  Neurological: Negative for extremity weakness.  Hematological: Negative for adenopathy.  Psychiatric/Behavioral: Negative for confusion.    MEDICAL HISTORY:  Past Medical History:  Diagnosis Date  . ADHD   . Epilepsy (HCC)   . Iron deficiency anemia 12/02/2019  . Migraine   . Mood disorder (HCC)   . Seizures (HCC)     SURGICAL HISTORY: Past Surgical History:  Procedure Laterality Date  . NO PAST SURGERIES      SOCIAL HISTORY: Social History   Socioeconomic History  . Marital status: Single    Spouse name: Not on file  . Number of children: Not on file  . Years of education: Not on file  . Highest education level: 12th grade  Occupational History  . Occupation: factory   Tobacco Use  . Smoking status: Never Smoker  . Smokeless tobacco: Never Used  Substance and Sexual Activity  . Alcohol use: Not Currently  . Drug use: Not Currently  . Sexual activity: Yes    Partners: Male    Birth control/protection: None  Other Topics Concern  . Not on file  Social History Narrative   Right handed    Social Determinants of Health   Financial Resource Strain:   . Difficulty of Paying Living Expenses: Not on file  Food Insecurity:   . Worried About Programme researcher, broadcasting/film/video in the Last Year: Not on file  . Ran Out of Food in the Last Year: Not on file  Transportation Needs:   . Lack of Transportation (Medical): Not on file  . Lack  of Transportation (Non-Medical): Not on file  Physical Activity:   . Days of Exercise per Week: Not on file  . Minutes of Exercise per Session: Not on file  Stress:   . Feeling of Stress : Not on file  Social Connections:   . Frequency of Communication with Friends and Family: Not on file  . Frequency of Social Gatherings with Friends and Family: Not on file  . Attends  Religious Services: Not on file  . Active Member of Clubs or Organizations: Not on file  . Attends Archivist Meetings: Not on file  . Marital Status: Not on file  Intimate Partner Violence:   . Fear of Current or Ex-Partner: Not on file  . Emotionally Abused: Not on file  . Physically Abused: Not on file  . Sexually Abused: Not on file    FAMILY HISTORY: Family History  Problem Relation Age of Onset  . Multiple sclerosis Mother   . Hypertension Father   . Heart Problems Sister     ALLERGIES:  has No Known Allergies.  MEDICATIONS:  Current Outpatient Medications  Medication Sig Dispense Refill  . prenatal vitamin w/FE, FA (PRENATAL 1 + 1) 27-1 MG TABS tablet Take 1 tablet by mouth daily at 12 noon.    . IRON SUCROSE IV Inject 1 Dose into the vein once a week.     No current facility-administered medications for this visit.     PHYSICAL EXAMINATION: ECOG PERFORMANCE STATUS: 1 - Symptomatic but completely ambulatory Vitals:   12/02/19 1512  BP: 120/74  Pulse: 91  Resp: 16  Temp: 97.7 F (36.5 C)   Filed Weights   12/02/19 1512  Weight: 215 lb 1.6 oz (97.6 kg)    Physical Exam Constitutional:      General: She is not in acute distress. HENT:     Head: Normocephalic and atraumatic.  Eyes:     General: No scleral icterus.    Pupils: Pupils are equal, round, and reactive to light.  Cardiovascular:     Rate and Rhythm: Normal rate and regular rhythm.     Heart sounds: Normal heart sounds.  Pulmonary:     Effort: Pulmonary effort is normal. No respiratory distress.     Breath sounds: No wheezing.  Abdominal:     General: Bowel sounds are normal. There is no distension.     Palpations: Abdomen is soft. There is no mass.     Tenderness: There is no abdominal tenderness.     Comments: Gravid uterus  Musculoskeletal:        General: No deformity. Normal range of motion.     Cervical back: Normal range of motion and neck supple.  Skin:    General:  Skin is warm and dry.     Findings: No erythema or rash.  Neurological:     Mental Status: She is alert and oriented to person, place, and time.     Cranial Nerves: No cranial nerve deficit.     Coordination: Coordination normal.  Psychiatric:        Mood and Affect: Mood normal.       CMP Latest Ref Rng & Units 11/24/2019  Glucose 65 - 99 mg/dL 63(L)  BUN 6 - 20 mg/dL 4(L)  Creatinine 0.57 - 1.00 mg/dL 0.46(L)  Sodium 134 - 144 mmol/L 140  Potassium 3.5 - 5.2 mmol/L 4.3  Chloride 96 - 106 mmol/L 106  CO2 20 - 29 mmol/L 20  Calcium 8.7 - 10.2 mg/dL  9.1  Total Protein 6.0 - 8.5 g/dL 6.0  Total Bilirubin 0.0 - 1.2 mg/dL 0.3  Alkaline Phos 39 - 117 IU/L 152(H)  AST 0 - 40 IU/L 13  ALT 0 - 32 IU/L 5   CBC Latest Ref Rng & Units 11/24/2019  WBC 3.4 - 10.8 x10E3/uL 8.9  Hemoglobin 11.1 - 15.9 g/dL 7.4(Q9.8(L)  Hematocrit 59.534.0 - 46.6 % 30.1(L)  Platelets 150 - 450 x10E3/uL 283     LABORATORY DATA:  I have reviewed the data as listed Lab Results  Component Value Date   WBC 8.9 11/24/2019   HGB 9.8 (L) 11/24/2019   HCT 30.1 (L) 11/24/2019   MCV 74 (L) 11/24/2019   PLT 283 11/24/2019   Recent Labs    06/13/19 0852 07/30/19 1109 11/24/19 1426  NA 136 137 140  K 3.7 3.7 4.3  CL 106 105 106  CO2 23 25 20   GLUCOSE 84 79 63*  BUN 6 <5* 4*  CREATININE 0.49 0.40* 0.46*  CALCIUM 9.1 8.7* 9.1  GFRNONAA >60 >60 141  GFRAA >60 >60 162  PROT  --   --  6.0  ALBUMIN  --   --  3.4*  AST  --   --  13  ALT  --   --  5  ALKPHOS  --   --  152*  BILITOT  --   --  0.3   Iron/TIBC/Ferritin/ %Sat    Component Value Date/Time   IRON 32 11/24/2019 1426   TIBC 609 (HH) 11/24/2019 1426   FERRITIN 5 (L) 11/24/2019 1426   IRONPCTSAT 5 (LL) 11/24/2019 1426     No results found.    ASSESSMENT & PLAN:  1. Anemia during pregnancy   2. Other iron deficiency anemia   3. Other fatigue     Labs are reviewed and discussed with patient. Consistent with iron deficiency  anemia. Patient has tried oral iron supplementation without improvement. Recommend IV iron with Venofer 200mg  weekly x 2-4 doses. Allergy reactions/infusion reaction including anaphylactic reaction discussed with patient. Other side effects include but not limited to high blood pressure, skin rash, weight gain, leg swelling, etc. Patient voices understanding and willing to proceed. She is is full-term and close to delivery.  She may be able to get 1 or 2 doses prior to delivery and hopefully to further optimize from iron store and anemia.  Anticipate that she will lose some blood delivery process, I recommend patient to follow-up in 6 weeks with reassessment of additional need of IV iron.  She agrees with the plan. Fatigue, likely secondary to anemia/iron deficiency/pregnancy.  Anticipate to improve with improvement hemoglobin level and iron level.  Orders Placed This Encounter  Procedures  . CBC with Differential    Standing Status:   Future    Standing Expiration Date:   12/01/2020  . Ferritin    Standing Status:   Future    Standing Expiration Date:   12/01/2020  . Iron and TIBC    Standing Status:   Future    Standing Expiration Date:   12/01/2020    All questions were answered. The patient knows to call the clinic with any problems questions or concerns.  Cc Schuman, Christanna R, *  Return of visit: 6 weeks Thank you for this kind referral and the opportunity to participate in the care of this patient. A copy of today's note is routed to referring provider  Total face to face encounter time for this patient visit was  . >50% of the time was  spent in counseling and coordination of care.    Rickard Patience, MD, PhD Hematology Oncology [facility] at [facility] Pager- [phone] 12/02/2019

## 2019-12-02 NOTE — Progress Notes (Signed)
New patient for evaluation of anemia during pregnancy.  Her due date is 12/24/2019.

## 2019-12-05 NOTE — L&D Delivery Note (Signed)
Date of delivery: 12/22/2019 Estimated Date of Delivery: 12/14/19 Patient's last menstrual period was 02/12/2019 (exact date). EGA: [redacted]w[redacted]d  Delivery Note At 12:57 AM a viable female was delivered via Vaginal, Spontaneous (Presentation: OA).   APGAR: 9, 9; weight: 3650 g, 8 pounds 1 ounce.   Placenta status: Spontaneous, Intact.  Cord: 3 vessels with the following complications:  Marginal insertion.  Cord pH: NA  Called to see patient.  Mom pushed to deliver a viable female infant.  The head followed by a loop of cord and the shoulders, which delivered without difficulty, and the rest of the body.  No nuchal cord noted.  Baby to mom's chest.  Cord clamped and cut after 3 min delay.  Cord blood obtained.  Placenta delivered spontaneously, intact, with a 3-vessel cord. All counts correct.  Hemostasis obtained with IV pitocin and fundal massage.   Anesthesia: stadol IV  Episiotomy: None Lacerations: none  Suture Repair: NA Est. Blood Loss (mL): 335  Mom to postpartum.  Baby to Couplet care / Skin to Skin.  Tresea Mall, CNM 12/22/2019, 1:26 AM

## 2019-12-07 NOTE — Progress Notes (Signed)
HROB at 38wk1d: Good fetal movement. No vaginal bleeding or regular contractions. Having an IV iron infusion tomorrow. Last hemoglobin was 9.8 gm/dl. And ferratin was <5. GBS negative Wants to breast and bottle feed Contraception: Pills or Nexplanon Labor precautions ROB in 1 week.  Farrel Conners, CNM

## 2019-12-08 ENCOUNTER — Ambulatory Visit (INDEPENDENT_AMBULATORY_CARE_PROVIDER_SITE_OTHER): Payer: BC Managed Care – PPO | Admitting: Obstetrics and Gynecology

## 2019-12-08 ENCOUNTER — Other Ambulatory Visit: Payer: Self-pay

## 2019-12-08 ENCOUNTER — Inpatient Hospital Stay: Payer: BC Managed Care – PPO | Attending: Oncology

## 2019-12-08 VITALS — BP 110/70 | Wt 217.0 lb

## 2019-12-08 VITALS — BP 120/75 | HR 80 | Temp 97.2°F | Resp 18

## 2019-12-08 DIAGNOSIS — O99019 Anemia complicating pregnancy, unspecified trimester: Secondary | ICD-10-CM | POA: Insufficient documentation

## 2019-12-08 DIAGNOSIS — D508 Other iron deficiency anemias: Secondary | ICD-10-CM | POA: Diagnosis present

## 2019-12-08 DIAGNOSIS — Z3A39 39 weeks gestation of pregnancy: Secondary | ICD-10-CM

## 2019-12-08 DIAGNOSIS — G40909 Epilepsy, unspecified, not intractable, without status epilepticus: Secondary | ICD-10-CM

## 2019-12-08 DIAGNOSIS — O0993 Supervision of high risk pregnancy, unspecified, third trimester: Secondary | ICD-10-CM

## 2019-12-08 DIAGNOSIS — O99213 Obesity complicating pregnancy, third trimester: Secondary | ICD-10-CM

## 2019-12-08 DIAGNOSIS — O0990 Supervision of high risk pregnancy, unspecified, unspecified trimester: Secondary | ICD-10-CM

## 2019-12-08 DIAGNOSIS — O99353 Diseases of the nervous system complicating pregnancy, third trimester: Secondary | ICD-10-CM

## 2019-12-08 MED ORDER — SODIUM CHLORIDE 0.9 % IV SOLN
Freq: Once | INTRAVENOUS | Status: AC
  Filled 2019-12-08: qty 250

## 2019-12-08 MED ORDER — IRON SUCROSE 20 MG/ML IV SOLN
200.0000 mg | Freq: Once | INTRAVENOUS | Status: AC
  Administered 2019-12-08: 200 mg via INTRAVENOUS
  Filled 2019-12-08: qty 10
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
## 2019-12-08 NOTE — Progress Notes (Signed)
Pt tolerated infusion well. Pt denies any concerns or complaints or s/s of reaction. No s/s of distress. Pt and VS stable at discharge.

## 2019-12-09 ENCOUNTER — Ambulatory Visit: Payer: BC Managed Care – PPO

## 2019-12-15 ENCOUNTER — Other Ambulatory Visit: Payer: Self-pay

## 2019-12-15 ENCOUNTER — Inpatient Hospital Stay: Payer: BC Managed Care – PPO

## 2019-12-15 VITALS — BP 110/70 | HR 81 | Temp 98.0°F | Resp 18

## 2019-12-15 DIAGNOSIS — O99019 Anemia complicating pregnancy, unspecified trimester: Secondary | ICD-10-CM | POA: Diagnosis not present

## 2019-12-15 DIAGNOSIS — D508 Other iron deficiency anemias: Secondary | ICD-10-CM

## 2019-12-15 MED ORDER — SODIUM CHLORIDE 0.9 % IV SOLN
Freq: Once | INTRAVENOUS | Status: AC
  Filled 2019-12-15: qty 250

## 2019-12-15 MED ORDER — IRON SUCROSE 20 MG/ML IV SOLN
200.0000 mg | Freq: Once | INTRAVENOUS | Status: AC
  Administered 2019-12-15: 200 mg via INTRAVENOUS
  Filled 2019-12-15: qty 10

## 2019-12-16 ENCOUNTER — Ambulatory Visit: Payer: BC Managed Care – PPO

## 2019-12-17 ENCOUNTER — Ambulatory Visit (INDEPENDENT_AMBULATORY_CARE_PROVIDER_SITE_OTHER): Payer: BC Managed Care – PPO | Admitting: Obstetrics and Gynecology

## 2019-12-17 ENCOUNTER — Other Ambulatory Visit: Payer: Self-pay

## 2019-12-17 VITALS — BP 116/76 | Wt 220.0 lb

## 2019-12-17 DIAGNOSIS — O99213 Obesity complicating pregnancy, third trimester: Secondary | ICD-10-CM

## 2019-12-17 DIAGNOSIS — O0990 Supervision of high risk pregnancy, unspecified, unspecified trimester: Secondary | ICD-10-CM

## 2019-12-17 DIAGNOSIS — O0993 Supervision of high risk pregnancy, unspecified, third trimester: Secondary | ICD-10-CM

## 2019-12-17 DIAGNOSIS — O480 Post-term pregnancy: Secondary | ICD-10-CM

## 2019-12-17 DIAGNOSIS — Z3A40 40 weeks gestation of pregnancy: Secondary | ICD-10-CM

## 2019-12-17 NOTE — Progress Notes (Signed)
Obstetric H&P   Chief Complaint: Postdates  Prenatal Care Provider: [facility]  History of Present Illness: 24 y.o. G1P0 [redacted]w[redacted]d by 12/14/2019, by Ultrasound presenting for routine prenatal visit.  Some irregular contractions.  +FM, no LOF, no VB.  Pregnancy uncomplicated other than past medical history notable for seizure disorder, chlamydia and MJ positive ant NOB.    Pregravid weight 205 lb (93 kg) Total Weight Gain 15 lb (6.804 kg)  FIRST Problems (from 04/30/19 to present)    Problem Noted Resolved   Supervision of high risk pregnancy, antepartum 05/01/2019 by Conard Novak, MD No   Overview Addendum 12/07/2019  7:11 PM by Farrel Conners, CNM    Clinic [facility] Prenatal Labs  Dating 7 week ultrasound Blood type: O/Positive/-- (05/28 1000)   Genetic Screen NIPS: Negative XX Antibody:Negative (05/28 1000)  Anatomic [place] Complete 07/17/2019 Rubella: 1.45 (05/28 1000) Varicella: Non-immune  GTT  Third trimester: 99 RPR: Non Reactive (05/28 1000)   Rhogam N/A HBsAg: Negative (05/28 1000)   TDaP vaccine     10/14/2019                   Flu Shot: 11/24/2019 HIV: Non Reactive (05/28 1000)   Baby Food        Breast/bottle                         GBS: negative  Contraception Pills vs Nexplanon Pap: needed postpartum  CBB     CS/VBAC N/A   Support Person              Obesity affecting pregnancy 05/01/2019 by Conard Novak, MD No   BMI 34.0-34.9,adult 05/01/2019 by Conard Novak, MD No   Seizure disorder during pregnancy [facility]) 05/01/2019 by Conard Novak, MD No   Overview Signed 05/01/2019  9:59 AM by Conard Novak, MD    Not on meds. Has been many years since her last one.          Review of Systems: 10 point review of systems negative unless otherwise noted in HPI  Past Medical History: Past Medical History:  Diagnosis Date  . ADHD   . Epilepsy (HCC)   . Iron deficiency anemia 12/02/2019  . Migraine   . Mood disorder (HCC)   . Seizures (HCC)     Past  Surgical History: Past Surgical History:  Procedure Laterality Date  . NO PAST SURGERIES      Past Obstetric History: # 1 - Date: None, Sex: None, Weight: None, GA: None, Delivery: None, Apgar1: None, Apgar5: None, Living: None, Birth Comments: None   Past Gynecologic History:  Family History: Family History  Problem Relation Age of Onset  . Multiple sclerosis Mother   . Hypertension Father   . Heart Problems Sister     Social History: Social History   Socioeconomic History  . Marital status: Single    Spouse name: Not on file  . Number of children: Not on file  . Years of education: Not on file  . Highest education level: 12th grade  Occupational History  . Occupation: factory   Tobacco Use  . Smoking status: Never Smoker  . Smokeless tobacco: Never Used  Substance and Sexual Activity  . Alcohol use: Not Currently  . Drug use: Not Currently  . Sexual activity: Yes    Partners: Male    Birth control/protection: None  Other Topics Concern  . Not on file  Social History  Narrative   Right handed    Social Determinants of Health   Financial Resource Strain:   . Difficulty of Paying Living Expenses: Not on file  Food Insecurity:   . Worried About Charity fundraiser in the Last Year: Not on file  . Ran Out of Food in the Last Year: Not on file  Transportation Needs:   . Lack of Transportation (Medical): Not on file  . Lack of Transportation (Non-Medical): Not on file  Physical Activity:   . Days of Exercise per Week: Not on file  . Minutes of Exercise per Session: Not on file  Stress:   . Feeling of Stress : Not on file  Social Connections:   . Frequency of Communication with Friends and Family: Not on file  . Frequency of Social Gatherings with Friends and Family: Not on file  . Attends Religious Services: Not on file  . Active Member of Clubs or Organizations: Not on file  . Attends Archivist Meetings: Not on file  . Marital Status: Not on  file  Intimate Partner Violence:   . Fear of Current or Ex-Partner: Not on file  . Emotionally Abused: Not on file  . Physically Abused: Not on file  . Sexually Abused: Not on file    Medications: Prior to Admission medications   Medication Sig Start Date End Date Taking? Authorizing Provider  IRON SUCROSE IV Inject 1 Dose into the vein once a week.    [provider]  prenatal vitamin w/FE, FA (PRENATAL 1 + 1) 27-1 MG TABS tablet Take 1 tablet by mouth daily at 12 noon.    [provider]  promethazine (PHENERGAN) 12.5 MG tablet Take 1 tablet (12.5 mg total) by mouth every 6 (six) hours as needed for nausea or vomiting. 06/13/19 07/30/19  Lavonia Drafts, MD  sertraline (ZOLOFT) 50 MG tablet Take 50 mg by mouth daily.  05/15/19 07/30/19  [provider]    Allergies: No Known Allergies  Physical Exam: Vitals: Blood pressure 116/76, weight 220 lb (99.8 kg), last menstrual period 02/12/2019.  FHT: 135  General: NAD HEENT: normocephalic, anicteric Pulmonary: No increased work of breathing Cardiovascular: RRR, distal pulses 2+ Abdomen: Gravid, non-tender Leopolds:vtx 8lbs Genitourinary: closed / 30 / -3 Extremities: no edema, erythema, or tenderness Neurologic: Grossly intact Psychiatric: mood appropriate, affect full  Labs: No results found for this or any previous visit (from the past 24 hour(s)).  Assessment: 24 y.o. G1P0 [redacted]w[redacted]d by 12/14/2019, by Ultrasound presenting for ROB visit today  Plan: 1) IOL scheduled for 12/21/2019 at 41 weeks 0 days.  Order placed covid testing 12/18/2019  2) Fetus -+FHT  3) PNL - Blood type --/--/O POS (10/10 1557) / Anti-bodyscreen NEG (10/10 1557) / Rubella 1.45 (05/28 1000) / Varicella non-immune / RPR Non Reactive (10/16 1602) / HBsAg Negative (05/28 1000) / HIV Non Reactive (10/16 1602) / 1-hr OGTT 99 / GBS Negative/-- (12/21 1642)  4) Immunization History -  Immunization History  Administered Date(s) Administered    . Influenza,inj,Quad PF,6+ Mos 11/24/2019  . Tdap 10/14/2019    5) Disposition - pending delivery  Malachy Mood, MD, [place], [facility] 12/17/2019, 11:13 AM

## 2019-12-17 NOTE — Progress Notes (Signed)
ROB

## 2019-12-17 NOTE — Progress Notes (Signed)
  [facility] INDUCTION ASSESSMENT SCHEDULING JOETTE SCHMOKER August 12, 1996 Medical record #: [id number] Phone #:  Home Phone [phone]  Mobile [phone]    Prenatal Provider:[facility] Delivering Group[facility] Proposed admission date/time:12/21/2019 0900 Method of induction:Cytotec  Weight: Filed Weights01/13/21 1108Weight:220 lb (99.8 kg) BMI Body mass index is 36.61 kg/m. HIV Negative HSV Negative EDC Estimated Date of Delivery: 1/10/21based on [place]  Gestational age on admission: [redacted]w[redacted]d Gravidity/parity:G1P0  Cervix Score   0 1 2 3   Position Posterior Midposition Anterior   Consistency Firm Medium Soft   Effacement (%) 0-30 40-50 60-70 >80  Dilation (cm) Closed 1-2 3-4 >5  Baby's station -3 -2 -1 +1, +2   Bishop Score:1   Medical induction of labor  select indication(s) below Elective induction ?39 weeks multiparous patient ?39 weeks primiparous patient with Bishop score ?7 ?40 weeks primiparous patient   Medical Indications Adapted from ACOG Committee Opinion #560, "Medically Indicated Late Preterm and Early Term Deliveries," 2013.  PLACENTAL / UTERINE ISSUES FETAL ISSUES MATERNAL ISSUES  ? Placenta previa (36.0-37.6) ? Isoimmunization (37.0-38.6) ? Preeclampsia without severe features or gestational HTN (37.0)  ? Suspected accreta (34.0-35.6) ? Growth Restriction 01-25-1985) ? Preeclampsia with severe features (34.0)  ? Prior classical CD, uterine window, rupture (36.0-37.6) ? Isolated (38.0-39.6) ? Chronic HTN (38.0-39.6)  ? Prior myomectomy (37.0-38.6) ? Concurrent findings (34.0-37.6) ? Cholestasis (37.0)  ? Umbilical vein varix (37.0) ? Growth Restriction (Twins) ? Diabetes  ? Placental abruption (chronic) ? Di-Di Isolated (36.0-37.6) ? Pregestational, controlled (39.0)  OBSTETRIC ISSUES ? Di-Di concurrent findings (32.0-34.6) ? Pregestational, uncontrolled (37.0-39.0)  ? Postdates ? (41 weeks) ? Mo-Di isolated (32.0-34.6) ? Pregestational,  vascular compromise (37.0- 39.0)  ? PPROM (34.0) ? Multiple Gestation ? Gestational, diet controlled (40.0)  ? Hx of IUFD (39.0 weeks) ? Di-Di (38.0-38.6) ? Gestational, med controlled (39.0)  ? Polyhydramnios, mild/moderate; SDV 8-16 or AFI 25-35 (39.0) ? Mo-Di (36.0-37.6) ? Gestational, uncontrolled (38.0-39.0)  ? Oligohydramnios (36.0-37.6); MVP <2 cm  For indications not listed above, delivery recommendations from maternal-fetal medicine consultant occurred on: Date:N/A  Provider Signature: Alieah Brinton Scheduled by: Date:12/17/2019 11:29 AM   Call [phone] to finalize the induction date/time  [phone] (07/17)

## 2019-12-18 ENCOUNTER — Other Ambulatory Visit
Admission: RE | Admit: 2019-12-18 | Discharge: 2019-12-18 | Disposition: A | Discharge status: Home / Self Care | Payer: BC Managed Care – PPO | Source: Ambulatory Visit | Attending: Obstetrics and Gynecology | Admitting: Obstetrics and Gynecology

## 2019-12-18 DIAGNOSIS — Z20822 Contact with and (suspected) exposure to covid-19: Secondary | ICD-10-CM | POA: Insufficient documentation

## 2019-12-18 DIAGNOSIS — Z01812 Encounter for preprocedural laboratory examination: Secondary | ICD-10-CM | POA: Diagnosis not present

## 2019-12-18 LAB — SARS CORONAVIRUS 2 (TAT 6-24 HRS): SARS Coronavirus 2: NEGATIVE

## 2019-12-21 ENCOUNTER — Other Ambulatory Visit: Payer: Self-pay

## 2019-12-21 ENCOUNTER — Encounter: Payer: Self-pay | Admitting: Obstetrics and Gynecology

## 2019-12-21 ENCOUNTER — Inpatient Hospital Stay
Admission: RE | Admit: 2019-12-21 | Discharge: 2019-12-23 | DRG: 806 | Disposition: A | Discharge status: Home / Self Care | Payer: BC Managed Care – PPO | Attending: Obstetrics and Gynecology | Admitting: Obstetrics and Gynecology

## 2019-12-21 DIAGNOSIS — D62 Acute posthemorrhagic anemia: Secondary | ICD-10-CM | POA: Diagnosis not present

## 2019-12-21 DIAGNOSIS — O43123 Velamentous insertion of umbilical cord, third trimester: Secondary | ICD-10-CM | POA: Diagnosis present

## 2019-12-21 DIAGNOSIS — O480 Post-term pregnancy: Secondary | ICD-10-CM | POA: Diagnosis present

## 2019-12-21 DIAGNOSIS — O99344 Other mental disorders complicating childbirth: Secondary | ICD-10-CM | POA: Diagnosis present

## 2019-12-21 DIAGNOSIS — F329 Major depressive disorder, single episode, unspecified: Secondary | ICD-10-CM | POA: Diagnosis present

## 2019-12-21 DIAGNOSIS — O0990 Supervision of high risk pregnancy, unspecified, unspecified trimester: Secondary | ICD-10-CM

## 2019-12-21 DIAGNOSIS — O99213 Obesity complicating pregnancy, third trimester: Secondary | ICD-10-CM

## 2019-12-21 DIAGNOSIS — O9081 Anemia of the puerperium: Secondary | ICD-10-CM | POA: Diagnosis not present

## 2019-12-21 DIAGNOSIS — Z6834 Body mass index (BMI) 34.0-34.9, adult: Secondary | ICD-10-CM

## 2019-12-21 DIAGNOSIS — Z3A41 41 weeks gestation of pregnancy: Secondary | ICD-10-CM

## 2019-12-21 DIAGNOSIS — G40909 Epilepsy, unspecified, not intractable, without status epilepticus: Secondary | ICD-10-CM

## 2019-12-21 HISTORY — DX: Anxiety disorder, unspecified: F41.9

## 2019-12-21 HISTORY — DX: Depression, unspecified: F32.A

## 2019-12-21 LAB — CBC
HCT: 33.7 % — ABNORMAL LOW (ref 36.0–46.0)
Hemoglobin: 10.4 g/dL — ABNORMAL LOW (ref 12.0–15.0)
MCH: 23.7 pg — ABNORMAL LOW (ref 26.0–34.0)
MCHC: 30.9 g/dL (ref 30.0–36.0)
MCV: 76.8 fL — ABNORMAL LOW (ref 80.0–100.0)
Platelets: 256 10*3/uL (ref 150–400)
RBC: 4.39 MIL/uL (ref 3.87–5.11)
RDW: 19.6 % — ABNORMAL HIGH (ref 11.5–15.5)
WBC: 9.3 10*3/uL (ref 4.0–10.5)
nRBC: 0 % (ref 0.0–0.2)

## 2019-12-21 LAB — TYPE AND SCREEN
ABO/RH(D): O POS
Antibody Screen: NEGATIVE

## 2019-12-21 MED ORDER — OXYTOCIN BOLUS FROM INFUSION
500.0000 mL | Freq: Once | INTRAVENOUS | Status: AC
  Administered 2019-12-22: 01:00:00 500 mL via INTRAVENOUS

## 2019-12-21 MED ORDER — MISOPROSTOL 200 MCG PO TABS
ORAL_TABLET | ORAL | Status: AC
  Administered 2019-12-21: 11:00:00 25 ug via VAGINAL
  Filled 2019-12-21: qty 4

## 2019-12-21 MED ORDER — LIDOCAINE HCL (PF) 1 % IJ SOLN
INTRAMUSCULAR | Status: AC
  Filled 2019-12-21: qty 30

## 2019-12-21 MED ORDER — AMMONIA AROMATIC IN INHA
RESPIRATORY_TRACT | Status: AC
  Filled 2019-12-21: qty 10

## 2019-12-21 MED ORDER — TERBUTALINE SULFATE 1 MG/ML IJ SOLN: 0.2500 mg | Freq: Once | INTRAMUSCULAR | Status: DC | PRN

## 2019-12-21 MED ORDER — SOD CITRATE-CITRIC ACID 500-334 MG/5ML PO SOLN: 30.0000 mL | ORAL | Status: DC | PRN

## 2019-12-21 MED ORDER — LACTATED RINGERS IV SOLN: 500.0000 mL | INTRAVENOUS | Status: DC | PRN

## 2019-12-21 MED ORDER — ONDANSETRON HCL 4 MG/2ML IJ SOLN: 4.0000 mg | Freq: Four times a day (QID) | INTRAMUSCULAR | Status: DC | PRN

## 2019-12-21 MED ORDER — LACTATED RINGERS IV SOLN: INTRAVENOUS | Status: DC

## 2019-12-21 MED ORDER — MISOPROSTOL 100 MCG PO TABS
25.0000 ug | ORAL_TABLET | ORAL | Status: DC | PRN
  Filled 2019-12-21 (×2): qty 1

## 2019-12-21 MED ORDER — OXYTOCIN 40 UNITS IN NORMAL SALINE INFUSION - SIMPLE MED
INTRAVENOUS | Status: AC
  Administered 2019-12-21: 22:00:00 2 m[IU]/min via INTRAVENOUS
  Filled 2019-12-21: qty 1000

## 2019-12-21 MED ORDER — BUTORPHANOL TARTRATE 1 MG/ML IJ SOLN
1.0000 mg | INTRAMUSCULAR | Status: DC | PRN
  Administered 2019-12-21 – 2019-12-22 (×2): 2 mg via INTRAVENOUS
  Filled 2019-12-21 (×2): qty 2

## 2019-12-21 MED ORDER — LIDOCAINE HCL (PF) 1 % IJ SOLN: 30.0000 mL | INTRAMUSCULAR | Status: DC | PRN

## 2019-12-21 MED ORDER — MISOPROSTOL 25 MCG QUARTER TABLET
ORAL_TABLET | ORAL | Status: AC
  Administered 2019-12-21: 25 ug
  Filled 2019-12-21: qty 1

## 2019-12-21 MED ORDER — OXYTOCIN 40 UNITS IN NORMAL SALINE INFUSION - SIMPLE MED: 2.5000 [IU]/h | INTRAVENOUS | Status: DC

## 2019-12-21 MED ORDER — BUTORPHANOL TARTRATE 1 MG/ML IJ SOLN
INTRAMUSCULAR | Status: AC
  Administered 2019-12-21: 20:00:00 2 mg via INTRAVENOUS
  Filled 2019-12-21: qty 2

## 2019-12-21 MED ORDER — ACETAMINOPHEN 325 MG PO TABS: 650.0000 mg | ORAL_TABLET | ORAL | Status: DC | PRN

## 2019-12-21 MED ORDER — OXYTOCIN 40 UNITS IN NORMAL SALINE INFUSION - SIMPLE MED
1.0000 m[IU]/min | INTRAVENOUS | Status: DC
  Filled 2019-12-21: qty 1000

## 2019-12-21 MED ORDER — OXYTOCIN 10 UNIT/ML IJ SOLN
INTRAMUSCULAR | Status: AC
  Filled 2019-12-21: qty 2

## 2019-12-21 NOTE — H&P (Signed)
History and Physical Interval Note:  12/21/2019 1:18 PM  Sarah Gomez  has presented today for INDUCTION OF LABOR (scheduled for 12/21/19),  with the diagnosis of Postdates. The various methods of treatment have been discussed with the patient and family. After consideration of risks, benefits and other options for treatment, the patient has consented to  Labor induction .  The patient's history has been reviewed, patient examined, no change in status, and is stable for induction as planned.  See H&P. I have reviewed the patient's chart and labs.  Questions were answered to the patient's satisfaction.    Review of Systems  Constitutional: Negative.   HENT: Negative.   Eyes: Negative.   Respiratory: Negative.   Cardiovascular: Negative.   Gastrointestinal: Negative.   Genitourinary: Negative.   Musculoskeletal: Negative.   Skin: Negative.   Neurological: Negative.   Endo/Heme/Allergies: Negative.   Psychiatric/Behavioral: Negative.       Ref Range & Units 3 d ago  SARS Coronavirus 2 NEGATIVE NEGATIVE    Vital Signs: BP 121/69 (BP Location: Left Arm)   Pulse (!) 107   Temp 98.2 F (36.8 C) (Oral)   Resp 20   Ht 5\' 5"  (1.651 m)   Wt 99.8 kg   LMP 02/12/2019 (Exact Date)   BMI 36.61 kg/m  Constitutional: Well nourished, well developed female in no acute distress.  HEENT: normal Skin: Warm and dry.  Cardiovascular: Regular rate and rhythm.   Extremity: trace edema  Respiratory: Clear to auscultation bilateral. Normal respiratory effort Abdomen: FHT present Back: no CVAT Neuro: DTRs 2+, Cranial nerves grossly intact Psych: Alert and Oriented x3. No memory deficits. Normal mood and affect.  MS: normal gait, normal bilateral lower extremity ROM/strength/stability.   Pelvic exam on admission: per RN L. Elks 1/50/-3  Toco: occasional Fetal well being: 130 bpm, moderate variability, +accelerations, -decelerations Start induction with cytotec   10-14-1971,  CNM [facility] 12/21/2019  1:18 PM

## 2019-12-22 ENCOUNTER — Encounter: Payer: Self-pay | Admitting: Obstetrics and Gynecology

## 2019-12-22 ENCOUNTER — Inpatient Hospital Stay: Payer: BC Managed Care – PPO

## 2019-12-22 LAB — CBC
HCT: 34.2 % — ABNORMAL LOW (ref 36.0–46.0)
Hemoglobin: 10.4 g/dL — ABNORMAL LOW (ref 12.0–15.0)
MCH: 23.6 pg — ABNORMAL LOW (ref 26.0–34.0)
MCHC: 30.4 g/dL (ref 30.0–36.0)
MCV: 77.6 fL — ABNORMAL LOW (ref 80.0–100.0)
Platelets: 267 10*3/uL (ref 150–400)
RBC: 4.41 MIL/uL (ref 3.87–5.11)
RDW: 19.4 % — ABNORMAL HIGH (ref 11.5–15.5)
WBC: 15.6 10*3/uL — ABNORMAL HIGH (ref 4.0–10.5)
nRBC: 0 % (ref 0.0–0.2)

## 2019-12-22 LAB — RPR: RPR Ser Ql: NONREACTIVE

## 2019-12-22 MED ORDER — IBUPROFEN 600 MG PO TABS
600.0000 mg | ORAL_TABLET | Freq: Four times a day (QID) | ORAL | Status: DC
  Administered 2019-12-22 – 2019-12-23 (×5): 600 mg via ORAL
  Filled 2019-12-22 (×6): qty 1

## 2019-12-22 MED ORDER — DIBUCAINE (PERIANAL) 1 % EX OINT: 1.0000 "application " | TOPICAL_OINTMENT | CUTANEOUS | Status: DC | PRN

## 2019-12-22 MED ORDER — COCONUT OIL OIL
1.0000 "application " | TOPICAL_OIL | Status: DC | PRN
  Filled 2019-12-22: qty 120

## 2019-12-22 MED ORDER — SERTRALINE HCL 25 MG PO TABS
25.0000 mg | ORAL_TABLET | Freq: Every day | ORAL | Status: DC
  Administered 2019-12-22 – 2019-12-23 (×2): 25 mg via ORAL
  Filled 2019-12-22 (×2): qty 1

## 2019-12-22 MED ORDER — ONDANSETRON HCL 4 MG/2ML IJ SOLN: 4.0000 mg | INTRAMUSCULAR | Status: DC | PRN

## 2019-12-22 MED ORDER — ONDANSETRON HCL 4 MG PO TABS: 4.0000 mg | ORAL_TABLET | ORAL | Status: DC | PRN

## 2019-12-22 MED ORDER — PRENATAL MULTIVITAMIN CH
1.0000 | ORAL_TABLET | Freq: Every day | ORAL | Status: DC
  Administered 2019-12-22 – 2019-12-23 (×2): 1 via ORAL
  Filled 2019-12-22 (×2): qty 1

## 2019-12-22 MED ORDER — WITCH HAZEL-GLYCERIN EX PADS: 1.0000 "application " | MEDICATED_PAD | CUTANEOUS | Status: DC | PRN

## 2019-12-22 MED ORDER — IBUPROFEN 600 MG PO TABS
ORAL_TABLET | ORAL | Status: AC
  Administered 2019-12-22: 04:00:00 600 mg via ORAL
  Filled 2019-12-22: qty 1

## 2019-12-22 MED ORDER — SIMETHICONE 80 MG PO CHEW: 80.0000 mg | CHEWABLE_TABLET | ORAL | Status: DC | PRN

## 2019-12-22 MED ORDER — DIPHENHYDRAMINE HCL 25 MG PO CAPS: 25.0000 mg | ORAL_CAPSULE | Freq: Four times a day (QID) | ORAL | Status: DC | PRN

## 2019-12-22 MED ORDER — BENZOCAINE-MENTHOL 20-0.5 % EX AERO: 1.0000 "application " | INHALATION_SPRAY | CUTANEOUS | Status: DC | PRN

## 2019-12-22 MED ORDER — SENNOSIDES-DOCUSATE SODIUM 8.6-50 MG PO TABS
2.0000 | ORAL_TABLET | ORAL | Status: DC
  Administered 2019-12-23: 11:00:00 2 via ORAL
  Filled 2019-12-22: qty 2

## 2019-12-22 MED ORDER — TETANUS-DIPHTH-ACELL PERTUSSIS 5-2.5-18.5 LF-MCG/0.5 IM SUSP: 0.5000 mL | Freq: Once | INTRAMUSCULAR | Status: DC

## 2019-12-22 MED ORDER — ACETAMINOPHEN 325 MG PO TABS
650.0000 mg | ORAL_TABLET | ORAL | Status: DC | PRN
  Administered 2019-12-22: 23:00:00 650 mg via ORAL
  Filled 2019-12-22: qty 2

## 2019-12-22 NOTE — Progress Notes (Signed)
Pt scored 16 on Post Partum Edinburgh Scale. Social Work consult placed per protocol. Farrel Conners, CNM states she talked to pt at bedside about hx of depression and Jill Side states pt is stable and will start home meds and will f/u with pt outpatient. Colleen States to d/c social work consult at this time.

## 2019-12-22 NOTE — Discharge Summary (Signed)
OB Discharge Summary     Patient Name: Sarah Gomez DOB: 04/09/1996 MRN: [id number]  Date of admission: 12/21/2019 Delivering provider: Tresea Mall, CNM  Date of Delivery: 12/22/2019  Date of discharge: 12/23/2019  Admitting diagnosis: Post-dates pregnancy [O48.0] Intrauterine pregnancy: [redacted]w[redacted]d     Secondary diagnosis: None     Discharge diagnosis: Term Pregnancy Delivered                                                                                                Post partum procedures:none  Augmentation: Pitocin and Cytotec  Complications: None  Hospital course:  Induction of Labor With Vaginal Delivery   24 y.o. yo G1P0 at [redacted]w[redacted]d was admitted to the hospital 12/21/2019 for induction of labor.  Indication for induction: Postdates.  Patient had an uncomplicated labor course as follows: Membrane Rupture Time/Date: 12:55 AM ,12/22/2019   Patient had delivery of viable female 12:57 AM, 12/22/2019. Details of delivery can be found in separate delivery note.    Patient had a routine postpartum course that was uncomplicated.  On PPD#1 she was ambulating, tolerating PO, her pain was well-controlled, and she was voiding spontaneously. Her lochia was appropriate. Patient is discharged home 12/23/19.  Physical exam  Vitals:   12/22/19 1612 12/23/19 0256 12/23/19 0727 12/23/19 1143  BP: (!) 113/54 121/68 128/62 121/74  Pulse: 79 67 74 78  Resp: 18 16 18 18   Temp: (!) 97.5 F (36.4 C) 97.7 F (36.5 C) 97.6 F (36.4 C) 98.6 F (37 C)  TempSrc: Oral Oral Oral Oral  SpO2: 99% 100% 100% 99%  Weight:      Height:       General: alert, cooperative and no distress Lochia: appropriate Uterine Fundus: firm Incision: N/A DVT Evaluation: No evidence of DVT seen on physical exam. No cords or calf tenderness. No significant calf/ankle edema.  Labs: Lab Results  Component Value Date   WBC 15.6 (H) 12/22/2019   HGB 10.4 (L) 12/22/2019   HCT 34.2 (L) 12/22/2019   MCV 77.6 (L)  12/22/2019   PLT 267 12/22/2019    Discharge instruction: per After Visit Summary.  Medications:  Allergies as of 12/23/2019   No Known Allergies     Medication List    STOP taking these medications   IRON SUCROSE IV     TAKE these medications   acetaminophen 325 MG tablet Commonly known as: Tylenol Take 2 tablets (650 mg total) by mouth every 4 (four) hours as needed (for pain scale < 4).   ibuprofen 600 MG tablet Commonly known as: ADVIL Take 1 tablet (600 mg total) by mouth every 6 (six) hours as needed for mild pain, moderate pain or cramping.   prenatal vitamin w/FE, FA 27-1 MG Tabs tablet Take 1 tablet by mouth daily at 12 noon.   sertraline 25 MG tablet Commonly known as: ZOLOFT Take 1 tablet (25 mg total) by mouth daily. Start taking on: December 24, 2019            Discharge Care Instructions  (From admission, onward)         Start  Ordered   12/23/19 0000  Discharge wound care:    Comments: Perform wound care instructions   12/23/19 1252          Diet: routine diet  Activity: Advance as tolerated. Pelvic rest for 6 weeks.   Outpatient follow up: Follow-up Information    Rod Can, CNM. Schedule an appointment as soon as possible for a visit in 2 week(s).   Specialty: Obstetrics Why: please call and schedule an appointment for a postpartum mood check for 2 weeks Contact information: [address] [place] [address] [phone]             Postpartum contraception: Nexplanon vs pills Rhogam Given postpartum: NA Rubella vaccine given postpartum: Immune Varicella vaccine given postpartum: yes TDaP given antepartum or postpartum: given antepartum  Newborn Data: Live born female [place] Birth Weight:  3650 g, 8 pounds 1 ounce APGAR: 9, 9  Newborn Delivery   Time head delivered: 12/22/2019 00:56:00 Birth date/time: 12/22/2019 00:57:00 Delivery type: Vaginal, Spontaneous       Baby Feeding: breast and  formula  Disposition:home with mother  SIGNED:  Prentice Docker, MD 12/23/2019 5:11 PM

## 2019-12-22 NOTE — Progress Notes (Signed)
Post Partum Day 0 Subjective: no complaints, up ad lib, voiding and tolerating PO. Is trying to breastfeed.  EPDS score was 16 with Question #10 hardly ever. Has no suicidal ideation. Feels good. Proud that she delivered her baby without an epidural. Has a long history of depression. Would like to start on medication prophyllactically.  Objective: Blood pressure 109/78, pulse 75, temperature 98 F (36.7 C), temperature source Oral, resp. rate 18, height 5\' 5"  (1.651 m), weight 99.8 kg, last menstrual period 02/12/2019, SpO2 99 %, unknown if currently breastfeeding.  Physical Exam:  General: alert, cooperative and no distress Lochia: appropriate Uterine Fundus: firm/ U-1/ML  DVT Evaluation: No evidence of DVT seen on physical exam.  Recent Labs    12/21/19 1004 12/22/19 0544  HGB 10.4* 10.4*  HCT 33.7* 34.2*  WBC 9.3 15.6*  PLT 256 267    Assessment/Plan: Stable DOD  Continue postpartum care Hx of depression-will start Zoloft 25 mgm daily x 3 days then increase to 50 mgm daily O POS/ RI/ VNI-offer varivax prior to discharge Breast Nexplanon vs pill TDAP UTD Probable discharge tomorrow.    LOS: 1 day   12/24/19 12/22/2019, 12:42 PM

## 2019-12-22 NOTE — Lactation Note (Signed)
This note was copied from a baby's chart. Lactation Consultation Note  Patient Name: Sarah Gomez Date: 12/22/2019 Reason for consult: Follow-up assessment;Mother's request;Primapara;Term  Mom requested lactation assistance because Sarah Gomez had not woke up to breast feed in over 4 1/2 hours.  She was also concerned that she had not voided and was told they needed a specimen before she could go home.  Explained to mom it was normal for babies to only have one void and one stool the first 24 hours of life and she was not 9 hours old yet.  Placed Sarah Gomez skin to skin with mom and she started turning her head to the breast and trying to get her hand in her mouth.  Eased her onto the left breast in cradle hold and she latched and nursed for 15 minutes.  Reassured and praised mom for her commitment to breast feed.  Mom requested breast pump.  Breast pump kit given with instructions in how to pump manually and to use with electric pump once she gets her pump through [employer].  Discussed when, how and why she would need to pump, but discouraged a lot of pumping and giving bottles too early explaining risks to successful breast feeding and milk supply.  Mom plans to return to work in 51 weeks.  Reviewed collection, storage, labeling, cleaning and handling of expressed milk.   Maternal Data Formula Feeding for Exclusion: No Has patient been taught Hand Expression?: Yes Does the patient have breastfeeding experience prior to this delivery?: No(Gr1)  Feeding Feeding Type: Breast Fed  LATCH Score Latch: Repeated attempts needed to sustain latch, nipple held in mouth throughout feeding, stimulation needed to elicit sucking reflex.  Audible Swallowing: A few with stimulation  Type of Nipple: Everted at rest and after stimulation  Comfort (Breast/Nipple): Soft / non-tender  Hold (Positioning): Assistance needed to correctly position infant at breast and maintain latch.  LATCH Score:  7  Interventions Interventions: Assisted with latch;Breast massage;Hand express;Breast compression;Adjust position;Support pillows;Position options;Hand pump;DEBP  Lactation Tools Discussed/Used Tools: Pump(DEBP kit given per mom's request) Breast pump type: Double-Electric Breast Pump;Manual WIC Program: Yes(Also has [employer]) Pump Review: Setup, frequency, and cleaning;Milk Storage;Other (comment) Initiated by:: Sarah Debruyn,RN,BSN,IBCLC Date initiated:: 12/22/19(Demonstrated DEBP Kit(manual & electric) but not pumping now)   Consult Status Consult Status: Follow-up Follow-up type: Call as needed    Jarold Motto 12/22/2019, 6:16 PM

## 2019-12-22 NOTE — Lactation Note (Signed)
This note was copied from a baby's chart. Lactation Consultation Note  Patient Name: Sarah Gomez Today's Date: 12/22/2019 Reason for consult: Initial assessment;Primapara;1st time breastfeeding;Term  When went in room mom was trying to get [name] to latch on right breast in cradle hold.  She was on and off the breast.  Mom reports being able to latch her to left breast without a problem, but struggles in beginning to get her to latch on the right side.  Demonstrated hand expression of colostrum to entice her to latch and after ward to rub on nipples to prevent bacteria, lubricate and for discomfort.  Assisted mom with pillow support in more comfortable position. Demonstrated modified cross cradle to have more control and how to sandwich breast for deeper latch.  Once [name] got deep enough latch she began strong, rhythmic sucking with occasional swallows.  Explained how to massage breast and gently stimulate [name] to keep her actively sucking at the breast.  She sustained latch and sucking for 50 minutes.  This is mom's first baby.  Mom has [employer] and has applied for MCD.  Explained process of getting DEBP through [employer].  Discussed feeding cues and encouraged mom to put [name] to the breast whenever she demonstrated hunger cues which mom was willing to do.  Hand out given on what to expect the first 4 days of [name]'s life reviewing routine newborn stomach size, supply and demand, normal course of lactation and routine newborn feeding patterns.  Lactation Government social research officer with contact numbers given and lactation name and number written on white board and encouraged to call with any questions, concerns or assistance. Maternal Data Formula Feeding for Exclusion: No Has patient been taught Hand Expression?: Yes Does the patient have breastfeeding experience prior to this delivery?: No(Gr1)  Feeding Feeding Type: Breast Fed  LATCH Score Latch: Repeated attempts needed to  sustain latch, nipple held in mouth throughout feeding, stimulation needed to elicit sucking reflex.  Audible Swallowing: A few with stimulation  Type of Nipple: Everted at rest and after stimulation  Comfort (Breast/Nipple): Soft / non-tender  Hold (Positioning): No assistance needed to correctly position infant at breast.  LATCH Score: 8  Interventions Interventions: Breast feeding basics reviewed;Assisted with latch;Skin to skin;Breast massage;Hand express;Breast compression;Adjust position;Support pillows;Position options  Lactation Tools Discussed/Used WIC Program: Yes(Also has [employer])   Consult Status Consult Status: Follow-up Date: 12/22/19 Follow-up type: Call as needed    Louis Meckel 12/22/2019, 2:41 PM

## 2019-12-22 NOTE — Lactation Note (Signed)
This note was copied from a baby's chart. Lactation Consultation Note  Patient Name: Sarah Gomez WNUUV'O Date: 12/22/2019 Reason for consult: Follow-up assessment;Mother's request;Difficult latch;Primapara;Term;Nipple pain/trauma  [name] had been going long intervals between feedings, but since she woke up for the 5:20 pm breast feed, she has wanted to feed constantly.  Mom gave her a pacifier.  The next time she went to the breast, she had difficulty getting her to latch and when she did mom experienced lots of nipple pain.  She tried both breasts and different positions with the left being more painful than the right.  She called out for a bottle of formula.  Went into room to assist.  Small pinpoint area noted on tip of left nipple.  Did not see any blood when hand expressed, but mom said it had been bleeding in the bathroom. Could hand express copious amount of colostrum, but was uncomfortable for mom since nipples were so sore.  Assisted with latching her to the right breast with and without #20 nipple shield.  Without nipple shield, she would not remained latched, just wanting tip of nipple and mom experienced excruciating pain.  [name] sustained the latch with nipple shield and mom could tolerate breast feed.  Discussed using nipple shield through the night if it helped with the nipple pain and told mom to ask for assistance if needed.  Coconut oil and comfort gels given with instructions in alternating use.   Maternal Data Formula Feeding for Exclusion: No Has patient been taught Hand Expression?: Yes Does the patient have breastfeeding experience prior to this delivery?: No(Gr1)  Feeding Feeding Type: Breast Fed  LATCH Score Latch: Repeated attempts needed to sustain latch, nipple held in mouth throughout feeding, stimulation needed to elicit sucking reflex.  Audible Swallowing: A few with stimulation  Type of Nipple: Everted at rest and after stimulation  Comfort  (Breast/Nipple): Filling, red/small blisters or bruises, mild/mod discomfort  Hold (Positioning): Assistance needed to correctly position infant at breast and maintain latch.  LATCH Score: 6  Interventions Interventions: Assisted with latch;Breast massage;Hand express;Breast compression;Adjust position;Support pillows;Position options;Coconut oil;Comfort gels  Lactation Tools Discussed/Used Tools: Pump;Coconut oil;Comfort gels;Nipple Shields Nipple shield size: 20 Breast pump type: Double-Electric Breast Pump;Manual WIC Program: Yes(Also has [employer]) Pump Review: Setup, frequency, and cleaning;Milk Storage;Other (comment) Initiated by:: S.Kenyon Eichelberger,RN,BSN,IBCLC Date initiated:: 12/22/19(Demonstrated use, but did not pump)   Consult Status Consult Status: Follow-up Date: 12/22/19 Follow-up type: Call as needed    Louis Meckel 12/22/2019, 7:26 PM

## 2019-12-23 ENCOUNTER — Encounter: Payer: Self-pay | Admitting: Certified Nurse Midwife

## 2019-12-23 ENCOUNTER — Ambulatory Visit: Payer: BC Managed Care – PPO

## 2019-12-23 MED ORDER — IBUPROFEN 600 MG PO TABS: 600.0000 mg | ORAL_TABLET | Freq: Four times a day (QID) | ORAL | 0 refills | Status: DC | PRN

## 2019-12-23 MED ORDER — ACETAMINOPHEN 325 MG PO TABS: 650.0000 mg | ORAL_TABLET | ORAL | Status: DC | PRN

## 2019-12-23 MED ORDER — SERTRALINE HCL 25 MG PO TABS: 25.0000 mg | ORAL_TABLET | Freq: Every day | ORAL | 0 refills | Status: DC

## 2019-12-23 MED ORDER — VARICELLA VIRUS VACCINE LIVE 1350 PFU/0.5ML IJ SUSR
0.5000 mL | Freq: Once | INTRAMUSCULAR | Status: AC
  Administered 2019-12-23: 0.5 mL via SUBCUTANEOUS
  Filled 2019-12-23 (×2): qty 0.5

## 2019-12-23 NOTE — Lactation Note (Signed)
This note was copied from a baby's chart. Lactation Consultation Note  Patient Name: Sarah Gomez [id number] Date: 12/23/2019 Reason for consult: Follow-up assessment   LC entered room for follow-up assessment. Mother of baby reported that she has continued breastfeeding, but that she is experiencing some nipple discomfort on both sides. Mother of baby reports that she is still using a nipple shield when she brings baby to breast. LC went over breastfeeding basics, and what to expect in the coming days for when mom and baby are discharged. LC asked mom if it would be alright to stick around and watch a feeding, because baby was showing early hunger cues. Mother of baby stated that she would not like to try another feeding at this time, and that she would like to have a conversation about formula, because she would like to now do both breast and formula. LC then went over the conversation about formula and paced bottle feeding. LC went over pacifier usage and how it is important to offer a pacifier that is more breast like, than the current pacifier that was being used. LC encouraged father of baby to be the one to bottle feed baby and mother of baby to be the one that offers breastmilk. LC encouraged mom to call out for additional support.   Maternal Data    Feeding    LATCH Score                   Interventions Interventions: Breast feeding basics reviewed  Lactation Tools Discussed/Used WIC Program: Yes   Consult Status Consult Status: Follow-up Date: 12/23/19 Follow-up type: In-patient    Danford Bad 12/23/2019, 10:27 AM

## 2019-12-23 NOTE — Lactation Note (Signed)
This note was copied from a baby's chart. Lactation Consultation Note  Patient Name: Sarah Gomez NOMVE'H Date: 12/23/2019 Reason for consult: Follow-up assessment  LC entered room for follow-up assessment. Mother and father of baby, as well as baby, were packing up to go home. LC went over pumping with mother of baby, reminded mother of baby to pump every 3-4 hours to maintain supply. Mother of baby reported soreness and bleeding from her right nipple. LC went over nipple care and gave mother of baby the comfort gels out of the refrigerator from earlier. Mother of baby stated that she offered the breast after offering formula at the last feed. LC encouraged mother of baby to offer the breast at every feed and then follow up with formula. LC encouraged mother of baby to reach out if she had additional questions/concerns and for continued lactation support.   Maternal Data Formula Feeding for Exclusion: No  Feeding    LATCH Score                   Interventions Interventions: Breast feeding basics reviewed;Comfort gels;Coconut oil(Went over nipple care, as mom reported nipple soreness)  Lactation Tools Discussed/Used WIC Program: Yes   Consult Status Consult Status: Complete Date: 12/23/19 Follow-up type: Call as needed    Danford Bad 12/23/2019, 4:12 PM

## 2019-12-23 NOTE — Progress Notes (Signed)
Patient discharged home with infant. Discharge instructions, prescriptions and follow up appointment given to and reviewed with patient. Patient verbalized understanding. Pt wheeled out with infant by NT 

## 2019-12-23 NOTE — Lactation Note (Addendum)
This note was copied from a baby's chart. Lactation Consultation Note  Patient Name: Sarah Gomez [id number] Date: 12/23/2019 Reason for consult: Follow-up assessment  Parents called out requesting formula for baby's feeding. Mom was preparing to shower, and is interested in trying formula before going home. LC reiterated the impact that formula can have on milk production, latching, and possibility of nipple confusion. Parents acknowledge the risk and plan to continue with both formula/breastmilk moving forward. LC assisted in preparing a 64mL bottle of Gerber Goodstart Gentle formula, educated dad on paced bottle feeding and position, and watched as baby began feeding; seemed to tolerate well. Encouraged to call out for additional support as needed. [facility] referral sent to notify of delivery and breastfeeding support.  Maternal Data    Feeding Feeding Type: Formula Nipple Type: Slow - flow  LATCH Score                   Interventions Interventions: Breast feeding basics reviewed  Lactation Tools Discussed/Used WIC Program: Yes   Consult Status Consult Status: Follow-up Date: 12/23/19 Follow-up type: In-patient    Danford Bad 12/23/2019, 11:31 AM

## 2019-12-23 NOTE — Progress Notes (Signed)
Obstetric Postpartum Daily Progress Note Subjective:  24 y.o. G1P1001 postpartum day #1 status post vaginal delivery.  She is ambulating, is tolerating po, is voiding spontaneously.  Her pain is well controlled on PO pain medications. Her lochia is less than menses.   Medications SCHEDULED MEDICATIONS  . ibuprofen  600 mg Oral Q6H  . prenatal multivitamin  1 tablet Oral Q1200  . senna-docusate  2 tablet Oral Q24H  . sertraline  25 mg Oral Daily  . Tdap  0.5 mL Intramuscular Once    MEDICATION INFUSIONS    PRN MEDICATIONS  acetaminophen, benzocaine-Menthol, coconut oil, witch hazel-glycerin **AND** dibucaine, diphenhydrAMINE, ondansetron **OR** ondansetron (ZOFRAN) IV, simethicone    Objective:   Vitals:   12/22/19 1612 12/23/19 0256 12/23/19 0727 12/23/19 1143  BP: (!) 113/54 121/68 128/62 121/74  Pulse: 79 67 74 78  Resp: 18 16 18 18   Temp: (!) 97.5 F (36.4 C) 97.7 F (36.5 C) 97.6 F (36.4 C) 98.6 F (37 C)  TempSrc: Oral Oral Oral Oral  SpO2: 99% 100% 100% 99%  Weight:      Height:        Current Vital Signs 24h Vital Sign Ranges  T 98.6 F (37 C) Temp  Avg: 97.9 F (36.6 C)  Min: 97.5 F (36.4 C)  Max: 98.6 F (37 C)  BP 121/74 BP  Min: 113/54  Max: 128/62  HR 78 Pulse  Avg: 74.5  Min: 67  Max: 79  RR 18 Resp  Avg: 17.5  Min: 16  Max: 18  SaO2 99 % Room Air SpO2  Avg: 99.5 %  Min: 99 %  Max: 100 %       24 Hour I/O Current Shift I/O  Time Ins Outs 01/18 0701 - 01/19 0700 In: 240 [P.O.:240] Out: 900 [Urine:900] No intake/output data recorded.  General: NAD Pulmonary: no increased work of breathing Abdomen: non-distended, non-tender, fundus firm at level of umbilicus Extremities: no edema, no erythema, no tenderness  Labs:  Recent Labs  Lab 12/21/19 1004 12/22/19 0544  WBC 9.3 15.6*  HGB 10.4* 10.4*  HCT 33.7* 34.2*  PLT 256 267     Assessment:   24 y.o. G1P1001 postpartum day # 1 status post SVD. Progressing well.  Plan:   1) Acute blood  loss anemia - hemodynamically stable and asymptomatic - po ferrous sulfate  2) O POS /  Information for the patient's newborn:  Marsa, Matteo Legrand Pitts  O POS   / Rubella 1.45 (05/28 1000)/ Varicella non-immune  3) TDAP status received 10/24/2019 Influenza vaccine: received 11/24/2019  4) breast and formula /Contraception = nexplanon or pill  5) Disposition: home today or tomorrow.  11/26/2019, MD 12/23/2019 12:49 PM

## 2019-12-25 ENCOUNTER — Telehealth: Payer: Self-pay

## 2019-12-25 NOTE — Telephone Encounter (Signed)
FMLA/DISABILITY form for [employer] filled out, signature obtained, and given to KT for processing. 

## 2019-12-29 ENCOUNTER — Ambulatory Visit: Payer: BC Managed Care – PPO | Admitting: Certified Nurse Midwife

## 2020-01-05 ENCOUNTER — Encounter: Payer: Self-pay | Admitting: Certified Nurse Midwife

## 2020-01-05 ENCOUNTER — Other Ambulatory Visit: Payer: Self-pay

## 2020-01-05 ENCOUNTER — Ambulatory Visit (INDEPENDENT_AMBULATORY_CARE_PROVIDER_SITE_OTHER): Payer: BC Managed Care – PPO | Admitting: Certified Nurse Midwife

## 2020-01-05 VITALS — BP 100/70 | HR 69 | Ht 65.0 in | Wt 197.0 lb

## 2020-01-05 DIAGNOSIS — F39 Unspecified mood [affective] disorder: Secondary | ICD-10-CM

## 2020-01-05 DIAGNOSIS — F53 Postpartum depression: Secondary | ICD-10-CM | POA: Diagnosis not present

## 2020-01-07 DIAGNOSIS — Z0289 Encounter for other administrative examinations: Secondary | ICD-10-CM

## 2020-01-13 ENCOUNTER — Inpatient Hospital Stay: Payer: BC Managed Care – PPO | Attending: Oncology

## 2020-01-13 ENCOUNTER — Encounter: Payer: Self-pay | Admitting: Oncology

## 2020-01-13 ENCOUNTER — Other Ambulatory Visit: Payer: Self-pay

## 2020-01-13 ENCOUNTER — Inpatient Hospital Stay (HOSPITAL_BASED_OUTPATIENT_CLINIC_OR_DEPARTMENT_OTHER): Payer: BC Managed Care – PPO | Admitting: Oncology

## 2020-01-13 DIAGNOSIS — D508 Other iron deficiency anemias: Secondary | ICD-10-CM | POA: Diagnosis not present

## 2020-01-13 DIAGNOSIS — O99019 Anemia complicating pregnancy, unspecified trimester: Secondary | ICD-10-CM

## 2020-01-13 LAB — CBC WITH DIFFERENTIAL/PLATELET
Abs Immature Granulocytes: 0.01 10*3/uL (ref 0.00–0.07)
Basophils Absolute: 0.1 10*3/uL (ref 0.0–0.1)
Basophils Relative: 1 %
Eosinophils Absolute: 0.2 10*3/uL (ref 0.0–0.5)
Eosinophils Relative: 3 %
HCT: 41.4 % (ref 36.0–46.0)
Hemoglobin: 12.3 g/dL (ref 12.0–15.0)
Immature Granulocytes: 0 %
Lymphocytes Relative: 42 %
Lymphs Abs: 2.1 10*3/uL (ref 0.7–4.0)
MCH: 23.1 pg — ABNORMAL LOW (ref 26.0–34.0)
MCHC: 29.7 g/dL — ABNORMAL LOW (ref 30.0–36.0)
MCV: 77.8 fL — ABNORMAL LOW (ref 80.0–100.0)
Monocytes Absolute: 0.3 10*3/uL (ref 0.1–1.0)
Monocytes Relative: 5 %
Neutro Abs: 2.4 10*3/uL (ref 1.7–7.7)
Neutrophils Relative %: 49 %
Platelets: 302 10*3/uL (ref 150–400)
RBC: 5.32 MIL/uL — ABNORMAL HIGH (ref 3.87–5.11)
RDW: 19.2 % — ABNORMAL HIGH (ref 11.5–15.5)
WBC: 5 10*3/uL (ref 4.0–10.5)
nRBC: 0 % (ref 0.0–0.2)

## 2020-01-13 LAB — IRON AND TIBC
Iron: 80 ug/dL (ref 28–170)
Saturation Ratios: 20 % (ref 10.4–31.8)
TIBC: 407 ug/dL (ref 250–450)
UIBC: 327 ug/dL

## 2020-01-13 LAB — FERRITIN: Ferritin: 17 ng/mL (ref 11–307)

## 2020-01-13 NOTE — Progress Notes (Signed)
Patient contacted for Mychart visit. No concerns voiced. 

## 2020-01-13 NOTE — Progress Notes (Signed)
HEMATOLOGY-ONCOLOGY TeleHEALTH VISIT PROGRESS NOTE  I connected with Foye Sarah Gomez on 01/13/20 at  2:15 PM EST by video enabled telemedicine visit and verified that I am speaking with the correct person using two identifiers. I discussed the limitations, risks, security and privacy concerns of performing an evaluation and management service by telemedicine and the availability of in-person appointments. I also discussed with the patient that there may be a patient responsible charge related to this service. The patient expressed understanding and agreed to proceed.   Other persons participating in the visit and their role in the encounter:  None  Patient's location: Home  Provider's location: office Chief Complaint: Iron deficiency anemia   INTERVAL HISTORY DEAVION STRIDER is a 24 y.o. female who has above history reviewed by me today presents for follow up visit for management of iron deficiency anemia in pregnancy Problems and complaints are listed below:  Since last visit, patient received 2 IV Venofer treatments.  She tolerates well. She also had an uncomplicated delivery in January 2021. She reports her energy level has improved.  No new complaints.  Review of Systems  Constitutional: Negative for appetite change, chills, fatigue and fever.  HENT:   Negative for hearing loss and voice change.   Eyes: Negative for eye problems.  Respiratory: Negative for chest tightness and cough.   Cardiovascular: Negative for chest pain.  Gastrointestinal: Negative for abdominal distention, abdominal pain and blood in stool.  Endocrine: Negative for hot flashes.  Genitourinary: Negative for difficulty urinating and frequency.   Musculoskeletal: Negative for arthralgias.  Skin: Negative for itching and rash.  Neurological: Negative for extremity weakness.  Hematological: Negative for adenopathy.  Psychiatric/Behavioral: Negative for confusion.    Past Medical History:  Diagnosis Date  .  ADHD   . Anxiety   . Depression   . Epilepsy ([place])   . Iron deficiency anemia 12/02/2019  . Migraine   . Mood disorder ([place])   . Seizures ([place])    Past Surgical History:  Procedure Laterality Date  . NO PAST SURGERIES      Family History  Problem Relation Age of Onset  . Multiple sclerosis Mother   . Hypertension Father   . Heart Problems Sister     Social History   Socioeconomic History  . Marital status: Single    Spouse name: Not on file  . Number of children: Not on file  . Years of education: Not on file  . Highest education level: 12th grade  Occupational History  . Occupation: factory   Tobacco Use  . Smoking status: Former Research scientist (life sciences)  . Smokeless tobacco: Never Used  Substance and Sexual Activity  . Alcohol use: Not Currently  . Drug use: Not Currently    Types: Marijuana  . Sexual activity: Not Currently    Partners: Male    Birth control/protection: None  Other Topics Concern  . Not on file  Social History Narrative   Right handed    Social Determinants of Health   Financial Resource Strain:   . Difficulty of Paying Living Expenses: Not on file  Food Insecurity:   . Worried About Charity fundraiser in the Last Year: Not on file  . Ran Out of Food in the Last Year: Not on file  Transportation Needs:   . Lack of Transportation (Medical): Not on file  . Lack of Transportation (Non-Medical): Not on file  Physical Activity:   . Days of Exercise per Week: Not on file  .  Minutes of Exercise per Session: Not on file  Stress:   . Feeling of Stress : Not on file  Social Connections:   . Frequency of Communication with Friends and Family: Not on file  . Frequency of Social Gatherings with Friends and Family: Not on file  . Attends Religious Services: Not on file  . Active Member of Clubs or Organizations: Not on file  . Attends Banker Meetings: Not on file  . Marital Status: Not on file  Intimate Partner Violence:   . Fear of Current or  Ex-Partner: Not on file  . Emotionally Abused: Not on file  . Physically Abused: Not on file  . Sexually Abused: Not on file    Current Outpatient Medications on File Prior to Visit  Medication Sig Dispense Refill  . acetaminophen (TYLENOL) 325 MG tablet Take 2 tablets (650 mg total) by mouth every 4 (four) hours as needed (for pain scale < 4).    . ibuprofen (ADVIL) 600 MG tablet Take 1 tablet (600 mg total) by mouth every 6 (six) hours as needed for mild pain, moderate pain or cramping. 30 tablet 0  . prenatal vitamin w/FE, FA (PRENATAL 1 + 1) 27-1 MG TABS tablet Take 1 tablet by mouth daily at 12 noon.    . sertraline (ZOLOFT) 25 MG tablet Take 1 tablet (25 mg total) by mouth daily. 30 tablet 0  . [DISCONTINUED] promethazine (PHENERGAN) 12.5 MG tablet Take 1 tablet (12.5 mg total) by mouth every 6 (six) hours as needed for nausea or vomiting. 30 tablet 0   No current facility-administered medications on file prior to visit.    No Known Allergies     Observations/Objective: There were no vitals filed for this visit. There is no height or weight on file to calculate BMI.  Physical Exam  Constitutional: No distress.  Neurological: She is alert.    CBC    Component Value Date/Time   WBC 5.0 01/13/2020 1016   RBC 5.32 (H) 01/13/2020 1016   HGB 12.3 01/13/2020 1016   HGB 9.8 (L) 11/24/2019 1426   HCT 41.4 01/13/2020 1016   HCT 30.1 (L) 11/24/2019 1426   PLT 302 01/13/2020 1016   PLT 283 11/24/2019 1426   MCV 77.8 (L) 01/13/2020 1016   MCV 74 (L) 11/24/2019 1426   MCH 23.1 (L) 01/13/2020 1016   MCHC 29.7 (L) 01/13/2020 1016   RDW 19.2 (H) 01/13/2020 1016   RDW 13.9 11/24/2019 1426   LYMPHSABS 2.1 01/13/2020 1016   LYMPHSABS 1.8 09/19/2019 1602   MONOABS 0.3 01/13/2020 1016   EOSABS 0.2 01/13/2020 1016   EOSABS 0.1 09/19/2019 1602   BASOSABS 0.1 01/13/2020 1016   BASOSABS 0.0 09/19/2019 1602    CMP     Component Value Date/Time   NA 140 11/24/2019 1426   K 4.3  11/24/2019 1426   CL 106 11/24/2019 1426   CO2 20 11/24/2019 1426   GLUCOSE 63 (L) 11/24/2019 1426   GLUCOSE 79 07/30/2019 1109   BUN 4 (L) 11/24/2019 1426   CREATININE 0.46 (L) 11/24/2019 1426   CALCIUM 9.1 11/24/2019 1426   PROT 6.0 11/24/2019 1426   ALBUMIN 3.4 (L) 11/24/2019 1426   AST 13 11/24/2019 1426   ALT 5 11/24/2019 1426   ALKPHOS 152 (H) 11/24/2019 1426   BILITOT 0.3 11/24/2019 1426   GFRNONAA 141 11/24/2019 1426   GFRAA 162 11/24/2019 1426     Assessment and Plan: 1. Other iron deficiency anemia  Labs reviewed and discussed with patient. Her hemoglobin has responded to IV iron treatments. Iron panel also shows improvement.  Her ferritin has improved to 17, iron saturation 20. Given that she is postpartum, breast-feeding, not able to tolerate oral iron supplementation as maintenance, I recommend patient to receive 1 more dose of IV Venofer to further improve her reserve.  She agrees with the plan.  Follow Up Instructions: 6 months.   I discussed the assessment and treatment plan with the patient. The patient was provided an opportunity to ask questions and all were answered. The patient agreed with the plan and demonstrated an understanding of the instructions.  The patient was advised to call back or seek an in-person evaluation if the symptoms worsen or if the condition fails to improve as anticipated.     Rickard Patience, MD 01/13/2020 10:42 PM

## 2020-01-14 ENCOUNTER — Other Ambulatory Visit: Payer: Self-pay

## 2020-01-14 ENCOUNTER — Inpatient Hospital Stay: Payer: BC Managed Care – PPO

## 2020-01-14 VITALS — BP 120/79 | HR 64 | Temp 97.8°F

## 2020-01-14 DIAGNOSIS — O99019 Anemia complicating pregnancy, unspecified trimester: Secondary | ICD-10-CM

## 2020-01-14 DIAGNOSIS — D508 Other iron deficiency anemias: Secondary | ICD-10-CM | POA: Diagnosis not present

## 2020-01-14 MED ORDER — IRON SUCROSE 20 MG/ML IV SOLN
200.0000 mg | Freq: Once | INTRAVENOUS | Status: AC
  Administered 2020-01-14: 200 mg via INTRAVENOUS
  Filled 2020-01-14: qty 10

## 2020-01-14 MED ORDER — SODIUM CHLORIDE 0.9 % IV SOLN
Freq: Once | INTRAVENOUS | Status: AC
  Filled 2020-01-14: qty 250

## 2020-01-18 ENCOUNTER — Encounter: Payer: Self-pay | Admitting: Certified Nurse Midwife

## 2020-01-18 NOTE — Progress Notes (Signed)
Postpartum Visit  Chief Complaint:  Chief Complaint  Patient presents with  . Postpartum Care    History of Present Illness: Sarah Gomez is a 24 y.o. G1P1001 who presents for a mood check postpartum. [name] feels she is coping well. She feels she has good support. No suicidal ideation.   Date of delivery: 12/22/2019 Type of delivery: Vaginal delivery - Vacuum or forceps assisted  no Episiotomy No.  Laceration: no  Pregnancy or labor problems:  Yes,long history of depression. Hx of seizures Any problems since the delivery:  Started on Zoloft prior to discharge when EPDS was 16.   Newborn Details:  SINGLETON :  1. 11 name: Sarah Gomez. Birth weight: 8#1oz Maternal Details:  Breast Feeding:   Breast and bottle feeding Post partum depression/anxiety noted:  yes [place] Post-Partum Depression Score:  11 on Zoloft 25 mgm daily   Review of Systems: ROS  Past Medical History:  Past Medical History:  Diagnosis Date  . ADHD   . Anxiety   . Depression   . Epilepsy ([place])   . Iron deficiency anemia 12/02/2019  . Migraine   . Mood disorder ([place])   . Seizures ([place])     Past Surgical History:  Past Surgical History:  Procedure Laterality Date  . NO PAST SURGERIES      Family History:  Family History  Problem Relation Age of Onset  . Multiple sclerosis Mother   . Hypertension Father   . Heart Problems Sister     Social History:  Social History   Socioeconomic History  . Marital status: Single    Spouse name: Not on file  . Number of children: 1  . Years of education: Not on file  . Highest education level: 12th grade  Occupational History  . Occupation: factory   Tobacco Use  . Smoking status: Former Research scientist (life sciences)  . Smokeless tobacco: Never Used  Substance and Sexual Activity  . Alcohol use: Not Currently  . Drug use: Not Currently    Types: Marijuana  . Sexual activity: Not Currently    Partners: Male    Birth control/protection: None  Other  Topics Concern  . Not on file  Social History Narrative   Right handed    Social Determinants of Health   Financial Resource Strain:   . Difficulty of Paying Living Expenses: Not on file  Food Insecurity:   . Worried About Charity fundraiser in the Last Year: Not on file  . Ran Out of Food in the Last Year: Not on file  Transportation Needs:   . Lack of Transportation (Medical): Not on file  . Lack of Transportation (Non-Medical): Not on file  Physical Activity:   . Days of Exercise per Week: Not on file  . Minutes of Exercise per Session: Not on file  Stress:   . Feeling of Stress : Not on file  Social Connections:   . Frequency of Communication with Friends and Family: Not on file  . Frequency of Social Gatherings with Friends and Family: Not on file  . Attends Religious Services: Not on file  . Active Member of Clubs or Organizations: Not on file  . Attends Archivist Meetings: Not on file  . Marital Status: Not on file  Intimate Partner Violence:   . Fear of Current or Ex-Partner: Not on file  . Emotionally Abused: Not on file  . Physically Abused: Not on file  . Sexually Abused: Not on file  Allergies:  No Known Allergies  Medications: Prior to Admission medications   Medication Sig Start Date End Date Taking? Authorizing Provider  acetaminophen (TYLENOL) 325 MG tablet Take 2 tablets (650 mg total) by mouth every 4 (four) hours as needed (for pain scale < 4). 12/23/19  Yes Conard Novak, MD  ibuprofen (ADVIL) 600 MG tablet Take 1 tablet (600 mg total) by mouth every 6 (six) hours as needed for mild pain, moderate pain or cramping. 12/23/19  Yes Conard Novak, MD  sertraline (ZOLOFT) 25 MG tablet Take 1 tablet (25 mg total) by mouth daily. 12/24/19  Yes Conard Novak, MD  prenatal vitamin w/FE, FA (PRENATAL 1 + 1) 27-1 MG TABS tablet Take 1 tablet by mouth daily at 12 noon.    [provider]  promethazine (PHENERGAN) 12.5 MG tablet  Take 1 tablet (12.5 mg total) by mouth every 6 (six) hours as needed for nausea or vomiting. 06/13/19 07/30/19  Jene Every, MD    Physical Exam Vitals: BP 100/70   Pulse 69   Ht 5\' 5"  (1.651 m)   Wt 197 lb (89.4 kg)   LMP  (LMP Unknown)   BMI 32.78 kg/m  General:WF in  NAD Neurologic: Grossly intact Psychiatric: mood appropriate, affect full  Assessment: 24 y.o. G1P1001 presenting for a 2 week postpartum mood check Coping well postpartum Plan:  Increase Zoloft to 50 mgm daily Discussed getting some time to self with a trusted person caring for baby (to go for a walk, take a shower, read a book, etc)  RTO for 6 week postpartum check and Nexplanon insertion  30, CNM

## 2020-01-21 ENCOUNTER — Ambulatory Visit
Admission: EM | Admit: 2020-01-21 | Discharge: 2020-01-21 | Disposition: A | Discharge status: Home / Self Care | Payer: BC Managed Care – PPO | Attending: Family Medicine | Admitting: Family Medicine

## 2020-01-21 ENCOUNTER — Other Ambulatory Visit: Payer: Self-pay

## 2020-01-21 ENCOUNTER — Encounter: Payer: Self-pay | Admitting: Emergency Medicine

## 2020-01-21 DIAGNOSIS — R1013 Epigastric pain: Secondary | ICD-10-CM

## 2020-01-21 DIAGNOSIS — R110 Nausea: Secondary | ICD-10-CM | POA: Diagnosis present

## 2020-01-21 LAB — URINALYSIS, COMPLETE (UACMP) WITH MICROSCOPIC
Bilirubin Urine: NEGATIVE
Glucose, UA: NEGATIVE mg/dL
Ketones, ur: NEGATIVE mg/dL
Nitrite: NEGATIVE
Protein, ur: NEGATIVE mg/dL
RBC / HPF: NONE SEEN RBC/hpf (ref 0–5)
Specific Gravity, Urine: 1.015 (ref 1.005–1.030)
pH: 7 (ref 5.0–8.0)

## 2020-01-21 MED ORDER — ONDANSETRON 8 MG PO TBDP: 8.0000 mg | ORAL_TABLET | Freq: Three times a day (TID) | ORAL | 0 refills | Status: DC | PRN

## 2020-01-21 NOTE — Discharge Instructions (Addendum)
Prilosec, or Pepcid over the counter , TUMS Avoid spicy or acidic foods

## 2020-01-21 NOTE — ED Triage Notes (Signed)
Patient c/o abdominal pain that started 45 minutes ago. Denies nausea, vomiting and diarrhea. States she ate around 5pm and the pain started about 30 minutes after she ate.
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
## 2020-01-23 ENCOUNTER — Other Ambulatory Visit: Payer: Self-pay | Admitting: Obstetrics and Gynecology

## 2020-01-23 NOTE — Telephone Encounter (Signed)
6 week PP scheduled with JEG in March

## 2020-02-03 ENCOUNTER — Encounter: Payer: Self-pay | Admitting: Advanced Practice Midwife

## 2020-02-03 ENCOUNTER — Ambulatory Visit (INDEPENDENT_AMBULATORY_CARE_PROVIDER_SITE_OTHER): Payer: BC Managed Care – PPO | Admitting: Advanced Practice Midwife

## 2020-02-03 ENCOUNTER — Other Ambulatory Visit (HOSPITAL_COMMUNITY)
Admission: RE | Admit: 2020-02-03 | Discharge: 2020-02-03 | Disposition: A | Discharge status: Home / Self Care | Payer: BC Managed Care – PPO | Source: Ambulatory Visit | Attending: Advanced Practice Midwife | Admitting: Advanced Practice Midwife

## 2020-02-03 ENCOUNTER — Other Ambulatory Visit: Payer: Self-pay

## 2020-02-03 DIAGNOSIS — O99345 Other mental disorders complicating the puerperium: Secondary | ICD-10-CM

## 2020-02-03 DIAGNOSIS — Z124 Encounter for screening for malignant neoplasm of cervix: Secondary | ICD-10-CM

## 2020-02-03 DIAGNOSIS — F53 Postpartum depression: Secondary | ICD-10-CM | POA: Diagnosis not present

## 2020-02-03 DIAGNOSIS — Z30011 Encounter for initial prescription of contraceptive pills: Secondary | ICD-10-CM

## 2020-02-03 MED ORDER — SERTRALINE HCL 25 MG PO TABS: 25.0000 mg | ORAL_TABLET | Freq: Every day | ORAL | 11 refills | Status: DC

## 2020-02-03 MED ORDER — NORGESTIMATE-ETH ESTRADIOL 0.25-35 MG-MCG PO TABS: 1.0000 | ORAL_TABLET | Freq: Every day | ORAL | 4 refills | Status: DC

## 2020-02-03 NOTE — Progress Notes (Signed)
Postpartum Visit  Chief Complaint:  Chief Complaint  Patient presents with  . Postpartum Care    History of Present Illness: Patient is a 25 y.o. G1P1001 presents for postpartum visit.  Review the Delivery Report for details.  Date of delivery: 12/22/2019 Type of delivery: Vaginal delivery - Vacuum or forceps assisted  no Episiotomy No.  Laceration: no  Pregnancy or labor problems:  no Any problems since the delivery:  no  Newborn Details:  SINGLETON :  1. BabyGender female. Birth weight: 8 pounds 1 ounce Maternal Details:  Breast or formula feeding: breast fed for about 3 weeks, now formula feeding Intercourse: No  Contraception after delivery: OCP Any bowel or bladder issues: No  Post partum depression/anxiety noted:  mild Edinburgh Post-Partum Depression Score: 11 Date of last PAP: First PAP today  Review of Systems: Review of Systems  Constitutional: Negative.   HENT: Negative.   Eyes: Negative.   Respiratory: Negative.   Cardiovascular: Negative.   Gastrointestinal: Negative.   Genitourinary: Negative.   Musculoskeletal: Negative.   Skin: Negative.   Neurological: Negative.   Endo/Heme/Allergies: Negative.   Psychiatric/Behavioral: Negative.      Past Medical History:  Past Medical History:  Diagnosis Date  . ADHD   . Anxiety   . Depression   . Epilepsy (HCC)   . Iron deficiency anemia 12/02/2019  . Migraine   . Mood disorder (HCC)   . Seizures (HCC)     Past Surgical History:  Past Surgical History:  Procedure Laterality Date  . NO PAST SURGERIES      Family History:  Family History  Problem Relation Age of Onset  . Multiple sclerosis Mother   . Hypertension Father   . Heart Problems Sister     Social History:  Social History   Socioeconomic History  . Marital status: Single    Spouse name: Not on file  . Number of children: 1  . Years of education: Not on file  . Highest education level: 12th grade  Occupational History    . Occupation: factory   Tobacco Use  . Smoking status: Former Games developer  . Smokeless tobacco: Never Used  Substance and Sexual Activity  . Alcohol use: Not Currently  . Drug use: Not Currently    Types: Marijuana  . Sexual activity: Not Currently    Partners: Male    Birth control/protection: None  Other Topics Concern  . Not on file  Social History Narrative   Right handed    Social Determinants of Health   Financial Resource Strain:   . Difficulty of Paying Living Expenses: Not on file  Food Insecurity:   . Worried About Programme researcher, broadcasting/film/video in the Last Year: Not on file  . Ran Out of Food in the Last Year: Not on file  Transportation Needs:   . Lack of Transportation (Medical): Not on file  . Lack of Transportation (Non-Medical): Not on file  Physical Activity:   . Days of Exercise per Week: Not on file  . Minutes of Exercise per Session: Not on file  Stress:   . Feeling of Stress : Not on file  Social Connections:   . Frequency of Communication with Friends and Family: Not on file  . Frequency of Social Gatherings with Friends and Family: Not on file  . Attends Religious Services: Not on file  . Active Member of Clubs or Organizations: Not on file  . Attends Banker Meetings: Not on file  .  Marital Status: Not on file  Intimate Partner Violence:   . Fear of Current or Ex-Partner: Not on file  . Emotionally Abused: Not on file  . Physically Abused: Not on file  . Sexually Abused: Not on file    Allergies:  No Known Allergies  Medications: Prior to Admission medications   Medication Sig Start Date End Date Taking? Authorizing Provider  sertraline (ZOLOFT) 25 MG tablet Take 1 tablet (25 mg total) by mouth daily. 02/03/20  Yes Rod Can, CNM  norgestimate-ethinyl estradiol (ORTHO-CYCLEN) 0.25-35 MG-MCG tablet Take 1 tablet by mouth daily. 02/03/20   Rod Can, CNM  prenatal vitamin w/FE, FA (PRENATAL 1 + 1) 27-1 MG TABS tablet Take 1 tablet by  mouth daily at 12 noon.    [provider]  promethazine (PHENERGAN) 12.5 MG tablet Take 1 tablet (12.5 mg total) by mouth every 6 (six) hours as needed for nausea or vomiting. 06/13/19 07/30/19  Lavonia Drafts, MD    Physical Exam Blood pressure 118/74, height 5\' 5"  (1.651 m), weight 193 lb (87.5 kg)    General: NAD HEENT: normocephalic, anicteric Pulmonary: No increased work of breathing Abdomen: NABS, soft, non-tender, non-distended.  Umbilicus without lesions.  No hepatomegaly, splenomegaly or masses palpable. No evidence of hernia. Genitourinary:  External: Normal external female genitalia.  Normal urethral meatus, normal  Bartholin's and Skene's glands.    Vagina: Normal vaginal mucosa, no evidence of prolapse.    Cervix: Grossly normal in appearance, no bleeding, no CMT  Uterus: Non-enlarged, mobile, normal contour.   Adnexa: ovaries non-enlarged, no adnexal masses  Rectal: deferred Extremities: no edema, erythema, or tenderness Neurologic: Grossly intact Psychiatric: mood appropriate, affect full   Edinburgh Postnatal Depression Scale - 02/03/20 1500      Edinburgh Postnatal Depression Scale:  In the Past 7 Days   I have been able to laugh and see the funny side of things.  1    I have looked forward with enjoyment to things.  1    I have blamed myself unnecessarily when things went wrong.  2    I have been anxious or worried for no good reason.  0    I have felt scared or panicky for no good reason.  2    Things have been getting on top of me.  2    I have been so unhappy that I have had difficulty sleeping.  1    I have felt sad or miserable.  1    I have been so unhappy that I have been crying.  1    The thought of harming myself has occurred to me.  0    Edinburgh Postnatal Depression Scale Total  11       Assessment: 24 y.o. G1P1001 presenting for 6 week postpartum visit  Plan: Problem List Items Addressed This Visit    None    Visit Diagnoses    6  weeks postpartum follow-up    -  Primary   Relevant Medications   norgestimate-ethinyl estradiol (ORTHO-CYCLEN) 0.25-35 MG-MCG tablet   sertraline (ZOLOFT) 25 MG tablet   Encounter for initial prescription of contraceptive pills       Relevant Medications   norgestimate-ethinyl estradiol (ORTHO-CYCLEN) 0.25-35 MG-MCG tablet   Postpartum depression       Relevant Medications   sertraline (ZOLOFT) 25 MG tablet       1) Contraception - Education given regarding options for contraception, as well as compatibility with breast feeding if applicable.  Patient plans on OCP (estrogen/progesterone) for contraception. Rx Sprintec  2)  Pap - ASCCP guidelines and rationale discussed.   Patient opts for beginning today screening interval  3) Patient underwent screening for postpartum depression with mild symptoms. She is taking zoloft 25 mg q day with good results  4) Return in about 1 year (around 02/02/2021) for annual established gyn.   Tresea Mall, CNM [facility] 02/03/2020, 3:39 PM

## 2020-02-05 LAB — CYTOLOGY - PAP: Diagnosis: NEGATIVE

## 2020-02-09 ENCOUNTER — Telehealth: Payer: Self-pay

## 2020-02-09 NOTE — Telephone Encounter (Signed)
FMLA/DISABILITY form for [employer] filled out, signature obtained and given to KT for processing. 

## 2020-03-08 ENCOUNTER — Telehealth: Payer: Self-pay

## 2020-03-08 NOTE — Telephone Encounter (Signed)
Pt needs a return to work note for the 12th of April. Pt aware the note is ready for pick up,

## 2020-08-29 ENCOUNTER — Other Ambulatory Visit: Payer: Self-pay

## 2020-08-29 ENCOUNTER — Ambulatory Visit
Admission: EM | Admit: 2020-08-29 | Discharge: 2020-08-29 | Disposition: A | Discharge status: Home / Self Care | Payer: Medicaid Other | Attending: Emergency Medicine | Admitting: Emergency Medicine

## 2020-08-29 DIAGNOSIS — Z79899 Other long term (current) drug therapy: Secondary | ICD-10-CM | POA: Insufficient documentation

## 2020-08-29 DIAGNOSIS — R05 Cough: Secondary | ICD-10-CM | POA: Diagnosis present

## 2020-08-29 DIAGNOSIS — F329 Major depressive disorder, single episode, unspecified: Secondary | ICD-10-CM | POA: Diagnosis not present

## 2020-08-29 DIAGNOSIS — Z20822 Contact with and (suspected) exposure to covid-19: Secondary | ICD-10-CM | POA: Insufficient documentation

## 2020-08-29 DIAGNOSIS — Z87891 Personal history of nicotine dependence: Secondary | ICD-10-CM | POA: Insufficient documentation

## 2020-08-29 DIAGNOSIS — J069 Acute upper respiratory infection, unspecified: Secondary | ICD-10-CM | POA: Insufficient documentation

## 2020-08-29 DIAGNOSIS — D509 Iron deficiency anemia, unspecified: Secondary | ICD-10-CM | POA: Diagnosis not present

## 2020-08-29 DIAGNOSIS — Z793 Long term (current) use of hormonal contraceptives: Secondary | ICD-10-CM | POA: Diagnosis not present

## 2020-08-29 DIAGNOSIS — F419 Anxiety disorder, unspecified: Secondary | ICD-10-CM | POA: Insufficient documentation

## 2020-08-29 MED ORDER — FLUTICASONE PROPIONATE 50 MCG/ACT NA SUSP: 2.0000 | Freq: Every day | NASAL | 0 refills | Status: DC

## 2020-08-29 MED ORDER — BENZONATATE 200 MG PO CAPS: ORAL_CAPSULE | ORAL | 0 refills | Status: DC
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
## 2020-08-29 NOTE — ED Triage Notes (Signed)
Patient complains of cough and runny nose, fatigue, chills and body aches since Friday. 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

## 2020-08-29 NOTE — Discharge Instructions (Addendum)
Plenty of rest.  Drink lots of fluids.  Tylenol or Motrin for aches and pains as well as fever or chills.  You were tested for Covid 19 today.  It'll be available in my chart from 12 to 24 hours.  If it is negative you may return to work when you're feeling improved.  If it is positive you'll need to contact your primary care physician and your workplace for their return to work protocol.

## 2020-08-30 LAB — SARS CORONAVIRUS 2 (TAT 6-24 HRS): SARS Coronavirus 2: NEGATIVE

## 2020-10-05 ENCOUNTER — Ambulatory Visit
Admission: RE | Admit: 2020-10-05 | Discharge: 2020-10-05 | Disposition: A | Discharge status: Home / Self Care | Payer: Medicaid Other | Source: Ambulatory Visit | Attending: Emergency Medicine | Admitting: Emergency Medicine

## 2020-10-05 ENCOUNTER — Other Ambulatory Visit: Payer: Self-pay

## 2020-10-05 VITALS — BP 120/76 | HR 77 | Temp 98.6°F | Resp 18 | Ht 65.0 in | Wt 210.0 lb

## 2020-10-05 DIAGNOSIS — R52 Pain, unspecified: Secondary | ICD-10-CM

## 2020-10-05 DIAGNOSIS — J029 Acute pharyngitis, unspecified: Secondary | ICD-10-CM | POA: Diagnosis not present

## 2020-10-05 DIAGNOSIS — J039 Acute tonsillitis, unspecified: Secondary | ICD-10-CM

## 2020-10-05 DIAGNOSIS — R6883 Chills (without fever): Secondary | ICD-10-CM | POA: Diagnosis not present

## 2020-10-05 LAB — GROUP A STREP BY PCR: Group A Strep by PCR: NOT DETECTED

## 2020-10-05 MED ORDER — AMOXICILLIN 500 MG PO TABS: 500.0000 mg | ORAL_TABLET | Freq: Two times a day (BID) | ORAL | 0 refills | Status: AC

## 2020-10-05 NOTE — Discharge Instructions (Addendum)
I am treating you for acute tonsillitis  I have sent in Amoxicillin for you to take twice a day for 7 days  Follow up with this office or with primary care if symptoms are persisting.  Follow up in the ER for high fever, trouble swallowing, trouble breathing, other concerning symptoms.

## 2020-10-05 NOTE — ED Provider Notes (Signed)
[facility]   [id number] 10/05/20 Arrival Time: 1536  GE:ZMOQ THROAT  SUBJECTIVE: History from: patient.  Sarah Gomez is a 24 y.o. female who presents with abrupt onset of sore throat for the last 3 days. Denies sick exposure to Covid, strep, flu or mono, or precipitating event. Has not attempted OTC treatment for this. Has  history of Covid. Has not completed Covid vaccines. Symptoms are made worse with swallowing, but tolerating liquids and own secretions without difficulty. Denies previous symptoms in the past.     Denies fever, chills, fatigue, ear pain, sinus pain, rhinorrhea, nasal congestion, cough, SOB, wheezing, chest pain, nausea, rash, changes in bowel or bladder habits.     ROS: As per HPI.  All other pertinent ROS negative.     Past Medical History:  Diagnosis Date  . ADHD   . Anxiety   . Depression   . Epilepsy (HCC)   . Iron deficiency anemia 12/02/2019  . Migraine   . Mood disorder (HCC)   . Seizures (HCC)    Past Surgical History:  Procedure Laterality Date  . NO PAST SURGERIES     No Known Allergies No current facility-administered medications on file prior to encounter.   Current Outpatient Medications on File Prior to Encounter  Medication Sig Dispense Refill  . benzonatate (TESSALON) 200 MG capsule Take one cap TID PRN cough 30 capsule 0  . fluticasone (FLONASE) 50 MCG/ACT nasal spray Place 2 sprays into both nostrils daily. 16 g 0  . [DISCONTINUED] norgestimate-ethinyl estradiol (ORTHO-CYCLEN) 0.25-35 MG-MCG tablet Take 1 tablet by mouth daily. 3 Package 4  . [DISCONTINUED] promethazine (PHENERGAN) 12.5 MG tablet Take 1 tablet (12.5 mg total) by mouth every 6 (six) hours as needed for nausea or vomiting. 30 tablet 0  . [DISCONTINUED] sertraline (ZOLOFT) 25 MG tablet Take 1 tablet (25 mg total) by mouth daily. 30 tablet 11   Social History   Socioeconomic History  . Marital status: Single    Spouse name: Not on file  . Number of  children: 1  . Years of education: Not on file  . Highest education level: 12th grade  Occupational History  . Occupation: factory   Tobacco Use  . Smoking status: Former Games developer  . Smokeless tobacco: Never Used  Vaping Use  . Vaping Use: Every day  Substance and Sexual Activity  . Alcohol use: Not Currently  . Drug use: Not Currently    Types: Marijuana  . Sexual activity: Not Currently    Partners: Male    Birth control/protection: None  Other Topics Concern  . Not on file  Social History Narrative   Right handed    Social Determinants of Health   Financial Resource Strain:   . Difficulty of Paying Living Expenses: Not on file  Food Insecurity:   . Worried About Programme researcher, broadcasting/film/video in the Last Year: Not on file  . Ran Out of Food in the Last Year: Not on file  Transportation Needs:   . Lack of Transportation (Medical): Not on file  . Lack of Transportation (Non-Medical): Not on file  Physical Activity:   . Days of Exercise per Week: Not on file  . Minutes of Exercise per Session: Not on file  Stress:   . Feeling of Stress : Not on file  Social Connections:   . Frequency of Communication with Friends and Family: Not on file  . Frequency of Social Gatherings with Friends and Family: Not on file  .  Attends Religious Services: Not on file  . Active Member of Clubs or Organizations: Not on file  . Attends Banker Meetings: Not on file  . Marital Status: Not on file  Intimate Partner Violence:   . Fear of Current or Ex-Partner: Not on file  . Emotionally Abused: Not on file  . Physically Abused: Not on file  . Sexually Abused: Not on file   Family History  Problem Relation Age of Onset  . Multiple sclerosis Mother   . Hypertension Father   . Heart Problems Sister     OBJECTIVE:  Vitals:   10/05/20 1602 10/05/20 1604 10/05/20 1605  BP:   120/76  Pulse:  77   Resp:  18   Temp:  98.6 F (37 C)   TempSrc:  Oral   SpO2:  100%   Weight: 210 lb  (95.3 kg)    Height: 5\' 5"  (1.651 m)       General appearance: alert; appears fatigued, but nontoxic, speaking in full sentences and managing own secretions HEENT: NCAT; Ears: EACs clear, TMs pearly gray with visible cone of light, without erythema; Eyes: PERRL, EOMI grossly; Nose: no obvious rhinorrhea; Throat: oropharynx erythematous, tonsils 2+ and erythematous with white tonsillar exudates, uvula midline Neck: supple with LAD Lungs: CTA bilaterally without adventitious breath sounds; cough absent Heart: regular rate and rhythm.  Radial pulses 2+ symmetrical bilaterally Skin: warm and dry Psychological: alert and cooperative; normal mood and affect  LABS: Results for orders placed or performed during the hospital encounter of 10/05/20 (from the past 24 hour(s))  Group A Strep by PCR     Status: None   Collection Time: 10/05/20  4:06 PM   Specimen: Throat; Sterile Swab  Result Value Ref Range   Group A Strep by PCR NOT DETECTED NOT DETECTED     ASSESSMENT & PLAN:  1. Acute tonsillitis, unspecified etiology   2. Chills   3. Aches     Meds ordered this encounter  Medications  . amoxicillin (AMOXIL) 500 MG tablet    Sig: Take 1 tablet (500 mg total) by mouth 2 (two) times daily for 7 days.    Dispense:  14 tablet    Refill:  0    Order Specific Question:   Supervising Provider    Answer:   13/02/21 Merrilee Jansky    Strep test negative. Declines test for mono at this time Will still treat for acute tonsillitis given clinical presentation Prescribed amoxicillin Get plenty of rest and push fluids Take OTC Zyrtec and use chloraseptic spray as needed for throat pain. Drink warm or cool liquids, use throat lozenges, or popsicles to help alleviate symptoms Take OTC ibuprofen or tylenol as needed for pain Follow up with PCP if symptoms persists Return or go to ER if patient has any new or worsening symptoms such as fever, chills, nausea, vomiting, worsening sore throat,  cough, abdominal pain, chest pain, changes in bowel or bladder habits  Reviewed expectations re: course of current medical issues. Questions answered. Outlined signs and symptoms indicating need for more acute intervention. Patient verbalized understanding. After Visit Summary given.          [id number], NP 10/05/20 1647

## 2020-10-05 NOTE — ED Triage Notes (Addendum)
Patient c/o sore throat that started 3 days ago. Denies any other symptoms. Patient was seen yesterday at her PCP and states her strep test was negative.

## 2020-12-12 ENCOUNTER — Other Ambulatory Visit: Payer: Medicaid Other

## 2020-12-12 DIAGNOSIS — Z20822 Contact with and (suspected) exposure to covid-19: Secondary | ICD-10-CM

## 2020-12-14 LAB — NOVEL CORONAVIRUS, NAA: SARS-CoV-2, NAA: DETECTED — AB

## 2020-12-14 LAB — SARS-COV-2, NAA 2 DAY TAT

## 2021-07-13 ENCOUNTER — Encounter: Payer: Self-pay | Admitting: Oncology

## 2021-07-13 ENCOUNTER — Telehealth: Payer: Self-pay

## 2021-07-13 NOTE — Telephone Encounter (Signed)
[facility] referring for first trimester pregnancy at 8 weeks 2 days per last LMP 05/14/21 w [facility] 02/18/22. 20 mo daughter, PNC only complicated w iron def anemia and 41+ induction. Paper records in epic. Called and left voicemail for patient to call back to be scheduled.

## 2021-08-03 ENCOUNTER — Other Ambulatory Visit (HOSPITAL_COMMUNITY)
Admission: RE | Admit: 2021-08-03 | Discharge: 2021-08-03 | Disposition: A | Discharge status: Home / Self Care | Payer: 59 | Source: Ambulatory Visit | Attending: Advanced Practice Midwife | Admitting: Advanced Practice Midwife

## 2021-08-03 ENCOUNTER — Encounter: Payer: Self-pay | Admitting: Advanced Practice Midwife

## 2021-08-03 ENCOUNTER — Other Ambulatory Visit: Payer: Self-pay

## 2021-08-03 ENCOUNTER — Ambulatory Visit (INDEPENDENT_AMBULATORY_CARE_PROVIDER_SITE_OTHER): Payer: 59 | Admitting: Advanced Practice Midwife

## 2021-08-03 VITALS — BP 126/79 | Wt 211.8 lb

## 2021-08-03 DIAGNOSIS — Z369 Encounter for antenatal screening, unspecified: Secondary | ICD-10-CM | POA: Insufficient documentation

## 2021-08-03 DIAGNOSIS — N926 Irregular menstruation, unspecified: Secondary | ICD-10-CM | POA: Diagnosis not present

## 2021-08-03 DIAGNOSIS — Z113 Encounter for screening for infections with a predominantly sexual mode of transmission: Secondary | ICD-10-CM | POA: Diagnosis present

## 2021-08-03 DIAGNOSIS — O99211 Obesity complicating pregnancy, first trimester: Secondary | ICD-10-CM

## 2021-08-03 DIAGNOSIS — Z131 Encounter for screening for diabetes mellitus: Secondary | ICD-10-CM

## 2021-08-03 DIAGNOSIS — Z3480 Encounter for supervision of other normal pregnancy, unspecified trimester: Secondary | ICD-10-CM | POA: Diagnosis present

## 2021-08-03 LAB — POCT URINALYSIS DIPSTICK OB
Glucose, UA: NEGATIVE
POC,PROTEIN,UA: NEGATIVE

## 2021-08-03 LAB — POCT URINE PREGNANCY: Preg Test, Ur: POSITIVE — AB

## 2021-08-03 NOTE — Patient Instructions (Signed)

## 2021-08-04 DIAGNOSIS — Z3480 Encounter for supervision of other normal pregnancy, unspecified trimester: Secondary | ICD-10-CM | POA: Insufficient documentation
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
## 2021-08-05 LAB — CERVICOVAGINAL ANCILLARY ONLY
Chlamydia: NEGATIVE
Comment: NEGATIVE
Comment: NEGATIVE
Comment: NORMAL
Neisseria Gonorrhea: NEGATIVE
Trichomonas: NEGATIVE

## 2021-08-10 ENCOUNTER — Other Ambulatory Visit: Payer: Medicaid Other

## 2021-08-10 ENCOUNTER — Encounter: Payer: Medicaid Other | Admitting: Obstetrics and Gynecology

## 2021-08-10 ENCOUNTER — Other Ambulatory Visit: Payer: Self-pay

## 2021-08-10 DIAGNOSIS — Z3480 Encounter for supervision of other normal pregnancy, unspecified trimester: Secondary | ICD-10-CM

## 2021-08-10 DIAGNOSIS — O99211 Obesity complicating pregnancy, first trimester: Secondary | ICD-10-CM

## 2021-08-10 DIAGNOSIS — Z131 Encounter for screening for diabetes mellitus: Secondary | ICD-10-CM

## 2021-08-10 DIAGNOSIS — Z369 Encounter for antenatal screening, unspecified: Secondary | ICD-10-CM

## 2021-08-10 DIAGNOSIS — Z113 Encounter for screening for infections with a predominantly sexual mode of transmission: Secondary | ICD-10-CM

## 2021-08-11 LAB — RPR+RH+ABO+RUB AB+AB SCR+CB...
Antibody Screen: NEGATIVE
HIV Screen 4th Generation wRfx: NONREACTIVE
Hematocrit: 39.8 % (ref 34.0–46.6)
Hemoglobin: 13.6 g/dL (ref 11.1–15.9)
Hepatitis B Surface Ag: NEGATIVE
MCH: 28.9 pg (ref 26.6–33.0)
MCHC: 34.2 g/dL (ref 31.5–35.7)
MCV: 85 fL (ref 79–97)
Platelets: 236 10*3/uL (ref 150–450)
RBC: 4.7 x10E6/uL (ref 3.77–5.28)
RDW: 11.8 % (ref 11.7–15.4)
RPR Ser Ql: NONREACTIVE
Rh Factor: POSITIVE
Rubella Antibodies, IGG: 1.26 index (ref >0.99)
Varicella zoster IgG: <135 index — ABNORMAL LOW (ref >165)
WBC: 5.9 10*3/uL (ref 3.4–10.8)

## 2021-08-11 LAB — GLUCOSE, 1 HOUR GESTATIONAL: Gestational Diabetes Screen: 101 mg/dL (ref 65–139)

## 2021-08-18 ENCOUNTER — Ambulatory Visit (INDEPENDENT_AMBULATORY_CARE_PROVIDER_SITE_OTHER): Payer: 59 | Admitting: Obstetrics and Gynecology

## 2021-08-18 ENCOUNTER — Other Ambulatory Visit: Payer: Self-pay

## 2021-08-18 VITALS — BP 113/76 | Wt 208.0 lb

## 2021-08-18 DIAGNOSIS — Z3A10 10 weeks gestation of pregnancy: Secondary | ICD-10-CM

## 2021-08-18 DIAGNOSIS — Z3480 Encounter for supervision of other normal pregnancy, unspecified trimester: Secondary | ICD-10-CM

## 2021-08-18 DIAGNOSIS — Z31438 Encounter for other genetic testing of female for procreative management: Secondary | ICD-10-CM

## 2021-08-18 DIAGNOSIS — Z3689 Encounter for other specified antenatal screening: Secondary | ICD-10-CM

## 2021-08-18 DIAGNOSIS — Z369 Encounter for antenatal screening, unspecified: Secondary | ICD-10-CM

## 2021-08-18 DIAGNOSIS — Z1379 Encounter for other screening for genetic and chromosomal anomalies: Secondary | ICD-10-CM

## 2021-08-18 LAB — POCT URINALYSIS DIPSTICK OB
Glucose, UA: NEGATIVE
POC,PROTEIN,UA: NEGATIVE
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
## 2021-08-18 NOTE — Progress Notes (Signed)
ROB - dating scan, feeling flushed in the face. Procedure RM

## 2021-08-18 NOTE — Progress Notes (Signed)
Dating Scan  Singleton viable IUP with CRL c/w [redacted]w[redacted]d (3.25cm, 3.26cm, and 3.35cm) giving and ultrasound derived EDD of  03/15/2021.  FHT 167BPM YS visualized 0.43cm ROV normal LOV normal, corpus luteum cyst 3.30cm x 1.99cm Uterus normal No free fluid  There is a viable singleton gestation.  The fetal biometry correlates with established dating. Detailed evaluation of the fetal anatomy is precluded by early gestational age.  It must be noted that a normal ultrasound particular at this early gestational age is unable to rule out fetal aneuploidy, risk of first trimester miscarriage, or anatomic birth defects.  Vena Austria, MD, Evern Core [facility], [facility] 08/18/2021, 10:16 AM

## 2021-08-26 ENCOUNTER — Telehealth: Payer: Self-pay

## 2021-08-26 NOTE — Telephone Encounter (Signed)
Pt calling for results from her last visit.  [phone]  Adv genetic tests are not back yet.  It usually takes about two weeks.

## 2021-08-30 LAB — INHERITEST CORE(CF97,SMA,FRAX)

## 2021-08-30 LAB — MATERNIT 21 PLUS CORE, BLOOD
Fetal Fraction: 8
Result (T21): NEGATIVE
Trisomy 13 (Patau syndrome): NEGATIVE
Trisomy 18 (Edwards syndrome): NEGATIVE
Trisomy 21 (Down syndrome): NEGATIVE

## 2021-08-31 ENCOUNTER — Telehealth: Payer: Self-pay

## 2021-08-31 NOTE — Telephone Encounter (Signed)
Patient received lab results in my chart. Having difficulty understanding them. Cb#[phone]

## 2021-08-31 NOTE — Telephone Encounter (Signed)
Spoke w/patient. She was having difficulty locating the gender. Advised listed as Fetal Sex: Consistent w/FEMALE.

## 2021-09-05 ENCOUNTER — Encounter: Payer: Self-pay | Admitting: Oncology

## 2021-09-16 ENCOUNTER — Other Ambulatory Visit: Payer: Self-pay

## 2021-09-16 ENCOUNTER — Ambulatory Visit (INDEPENDENT_AMBULATORY_CARE_PROVIDER_SITE_OTHER): Payer: Medicaid Other | Admitting: Obstetrics and Gynecology

## 2021-09-16 VITALS — BP 118/72 | Wt 210.0 lb

## 2021-09-16 DIAGNOSIS — O99213 Obesity complicating pregnancy, third trimester: Secondary | ICD-10-CM

## 2021-09-16 DIAGNOSIS — Z3480 Encounter for supervision of other normal pregnancy, unspecified trimester: Secondary | ICD-10-CM

## 2021-09-16 DIAGNOSIS — Z363 Encounter for antenatal screening for malformations: Secondary | ICD-10-CM

## 2021-09-16 DIAGNOSIS — Z3A14 14 weeks gestation of pregnancy: Secondary | ICD-10-CM
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
## 2021-09-16 NOTE — Progress Notes (Signed)
ROB - really bad reflux (vomiting up acid). RM 4

## 2021-09-25 ENCOUNTER — Ambulatory Visit: Admit: 2021-09-25 | Discharge status: Absent without leave | Payer: 59

## 2021-10-05 ENCOUNTER — Telehealth: Payer: Self-pay

## 2021-10-05 NOTE — Telephone Encounter (Signed)
Pt calling; is really constipated; having trouble sitting.  [phone] Pt states it's been about 2wks since her last BM; doesn't know if she has a hemorrhoid or not; adv metamucil, citrucel, fibercon, colace, eating fresh fruits and vegetables, heated prune juice.  Pt states has been eating vegetables and drink juice hoping it would help but no such luck.  Adv as a last resort can do a Fleet's enema.  If no relief needs to see an UC or PCP.

## 2021-10-10 ENCOUNTER — Emergency Department: Payer: 59

## 2021-10-10 ENCOUNTER — Emergency Department
Admission: EM | Admit: 2021-10-10 | Discharge: 2021-10-10 | Disposition: A | Discharge status: Home / Self Care | Payer: 59 | Attending: Student in an Organized Health Care Education/Training Program | Admitting: Student in an Organized Health Care Education/Training Program

## 2021-10-10 ENCOUNTER — Other Ambulatory Visit: Payer: Self-pay

## 2021-10-10 ENCOUNTER — Telehealth: Payer: Self-pay

## 2021-10-10 DIAGNOSIS — Z87891 Personal history of nicotine dependence: Secondary | ICD-10-CM | POA: Diagnosis not present

## 2021-10-10 DIAGNOSIS — Z3A17 17 weeks gestation of pregnancy: Secondary | ICD-10-CM | POA: Diagnosis not present

## 2021-10-10 DIAGNOSIS — O26892 Other specified pregnancy related conditions, second trimester: Secondary | ICD-10-CM | POA: Diagnosis present

## 2021-10-10 DIAGNOSIS — O26899 Other specified pregnancy related conditions, unspecified trimester: Secondary | ICD-10-CM

## 2021-10-10 DIAGNOSIS — R102 Pelvic and perineal pain: Secondary | ICD-10-CM | POA: Insufficient documentation

## 2021-10-10 LAB — COMPREHENSIVE METABOLIC PANEL
ALT: 18 U/L (ref 0–44)
AST: 23 U/L (ref 15–41)
Albumin: 3.1 g/dL — ABNORMAL LOW (ref 3.5–5.0)
Alkaline Phosphatase: 41 U/L (ref 38–126)
Anion gap: 7 (ref 5–15)
BUN: 6 mg/dL (ref 6–20)
CO2: 25 mmol/L (ref 22–32)
Calcium: 9.1 mg/dL (ref 8.9–10.3)
Chloride: 104 mmol/L (ref 98–111)
Creatinine, Ser: 0.52 mg/dL (ref 0.44–1.00)
GFR, Estimated: >60 mL/min (ref >60)
Glucose, Bld: 94 mg/dL (ref 70–99)
Potassium: 3.8 mmol/L (ref 3.5–5.1)
Sodium: 136 mmol/L (ref 135–145)
Total Bilirubin: 0.5 mg/dL (ref 0.3–1.2)
Total Protein: 6.3 g/dL — ABNORMAL LOW (ref 6.5–8.1)

## 2021-10-10 LAB — URINALYSIS, ROUTINE W REFLEX MICROSCOPIC
Bilirubin Urine: NEGATIVE
Glucose, UA: NEGATIVE mg/dL
Hgb urine dipstick: NEGATIVE
Ketones, ur: NEGATIVE mg/dL
Nitrite: NEGATIVE
Protein, ur: NEGATIVE mg/dL
Specific Gravity, Urine: 1.006 (ref 1.005–1.030)
pH: 7 (ref 5.0–8.0)

## 2021-10-10 LAB — CBC WITH DIFFERENTIAL/PLATELET
Abs Immature Granulocytes: 0.07 10*3/uL (ref 0.00–0.07)
Basophils Absolute: 0 10*3/uL (ref 0.0–0.1)
Basophils Relative: 1 %
Eosinophils Absolute: 0.1 10*3/uL (ref 0.0–0.5)
Eosinophils Relative: 1 %
HCT: 37.2 % (ref 36.0–46.0)
Hemoglobin: 12.9 g/dL (ref 12.0–15.0)
Immature Granulocytes: 1 %
Lymphocytes Relative: 26 %
Lymphs Abs: 2.2 10*3/uL (ref 0.7–4.0)
MCH: 29.5 pg (ref 26.0–34.0)
MCHC: 34.7 g/dL (ref 30.0–36.0)
MCV: 84.9 fL (ref 80.0–100.0)
Monocytes Absolute: 0.5 10*3/uL (ref 0.1–1.0)
Monocytes Relative: 6 %
Neutro Abs: 5.5 10*3/uL (ref 1.7–7.7)
Neutrophils Relative %: 65 %
Platelets: 226 10*3/uL (ref 150–400)
RBC: 4.38 MIL/uL (ref 3.87–5.11)
RDW: 12.3 % (ref 11.5–15.5)
WBC: 8.4 10*3/uL (ref 4.0–10.5)
nRBC: 0 % (ref 0.0–0.2)

## 2021-10-10 LAB — HCG, QUANTITATIVE, PREGNANCY: hCG, Beta Chain, Quant, S: 26788 m[IU]/mL — ABNORMAL HIGH (ref <5)

## 2021-10-10 NOTE — ED Notes (Signed)
Patient remains in US.

## 2021-10-10 NOTE — ED Notes (Signed)
Pelvic cart set-up and at the bedside

## 2021-10-10 NOTE — Telephone Encounter (Signed)
Pt calling; has been constipated; past couple days has been able to go; is having pressure and sharp pain in her stomack; makes her want to push - not like having a BM.  [phone]  Pt currently admitted to [facility] ED before I could call her.

## 2021-10-10 NOTE — ED Notes (Signed)
Patient transported to US via stretcher.

## 2021-10-10 NOTE — ED Triage Notes (Signed)
Pt presents to ED with c/o of ABD cramping. Pt states she is [redacted] weeks pregnant. Pt denies vaginal bleeding. Pt is A&Ox4. NAD noted.

## 2021-10-10 NOTE — Discharge Instructions (Signed)
Please follow up with OB/gyn.  Return for any bleeding, worsening abdominal pain, leakage of fluids or for any other questions or concerns.

## 2021-10-10 NOTE — ED Notes (Signed)
MD at the bedside  
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
## 2021-10-10 NOTE — ED Notes (Signed)
Patient return from US

## 2021-10-14 ENCOUNTER — Other Ambulatory Visit: Payer: Self-pay

## 2021-10-14 ENCOUNTER — Other Ambulatory Visit: Payer: Medicaid Other

## 2021-10-14 ENCOUNTER — Ambulatory Visit (INDEPENDENT_AMBULATORY_CARE_PROVIDER_SITE_OTHER): Payer: Medicaid Other | Admitting: Advanced Practice Midwife

## 2021-10-14 ENCOUNTER — Encounter: Payer: Self-pay | Admitting: Advanced Practice Midwife

## 2021-10-14 VITALS — BP 112/68 | Wt 212.0 lb

## 2021-10-14 DIAGNOSIS — Z3A18 18 weeks gestation of pregnancy: Secondary | ICD-10-CM

## 2021-10-14 DIAGNOSIS — Z3482 Encounter for supervision of other normal pregnancy, second trimester: Secondary | ICD-10-CM
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
## 2021-10-19 ENCOUNTER — Ambulatory Visit
Admission: EM | Admit: 2021-10-19 | Discharge: 2021-10-19 | Disposition: A | Discharge status: Home / Self Care | Payer: 59 | Attending: Medical Oncology | Admitting: Medical Oncology

## 2021-10-19 DIAGNOSIS — J101 Influenza due to other identified influenza virus with other respiratory manifestations: Secondary | ICD-10-CM | POA: Diagnosis present

## 2021-10-19 DIAGNOSIS — Z3A18 18 weeks gestation of pregnancy: Secondary | ICD-10-CM

## 2021-10-19 DIAGNOSIS — O26892 Other specified pregnancy related conditions, second trimester: Secondary | ICD-10-CM | POA: Insufficient documentation

## 2021-10-19 DIAGNOSIS — Z3490 Encounter for supervision of normal pregnancy, unspecified, unspecified trimester: Secondary | ICD-10-CM | POA: Diagnosis present

## 2021-10-19 DIAGNOSIS — O98512 Other viral diseases complicating pregnancy, second trimester: Secondary | ICD-10-CM

## 2021-10-19 LAB — RAPID INFLUENZA A&B ANTIGENS
Influenza A (ARMC): POSITIVE — AB
Influenza B (ARMC): NEGATIVE

## 2021-10-19 MED ORDER — OSELTAMIVIR PHOSPHATE 75 MG PO CAPS: 75.0000 mg | ORAL_CAPSULE | Freq: Two times a day (BID) | ORAL | 0 refills | Status: DC

## 2021-10-19 NOTE — ED Triage Notes (Signed)
Patient presents to Urgent Care with complaints of sneezing, fever, and chills since last night. Treating symptoms with robitussin.
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
## 2021-10-20 ENCOUNTER — Ambulatory Visit: Payer: 59

## 2021-10-25 ENCOUNTER — Ambulatory Visit
Admission: RE | Admit: 2021-10-25 | Discharge: 2021-10-25 | Disposition: A | Discharge status: Home / Self Care | Payer: 59 | Source: Ambulatory Visit | Attending: Obstetrics and Gynecology | Admitting: Obstetrics and Gynecology

## 2021-10-25 ENCOUNTER — Other Ambulatory Visit: Payer: Self-pay

## 2021-10-25 DIAGNOSIS — Z3482 Encounter for supervision of other normal pregnancy, second trimester: Secondary | ICD-10-CM | POA: Insufficient documentation

## 2021-10-25 DIAGNOSIS — Z363 Encounter for antenatal screening for malformations: Secondary | ICD-10-CM | POA: Diagnosis present

## 2021-10-25 DIAGNOSIS — Z3480 Encounter for supervision of other normal pregnancy, unspecified trimester: Secondary | ICD-10-CM

## 2021-10-26 ENCOUNTER — Ambulatory Visit (INDEPENDENT_AMBULATORY_CARE_PROVIDER_SITE_OTHER): Payer: Medicaid Other | Admitting: Obstetrics and Gynecology

## 2021-10-26 VITALS — BP 122/72 | Wt 211.0 lb

## 2021-10-26 DIAGNOSIS — Z3480 Encounter for supervision of other normal pregnancy, unspecified trimester: Secondary | ICD-10-CM

## 2021-10-26 DIAGNOSIS — Z3A20 20 weeks gestation of pregnancy: Secondary | ICD-10-CM

## 2021-10-26 DIAGNOSIS — O99213 Obesity complicating pregnancy, third trimester: Secondary | ICD-10-CM

## 2021-10-26 NOTE — Progress Notes (Signed)
ROB - no concerns. RM 5 
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
## 2021-11-22 ENCOUNTER — Ambulatory Visit (INDEPENDENT_AMBULATORY_CARE_PROVIDER_SITE_OTHER): Payer: Medicaid Other | Admitting: Obstetrics and Gynecology

## 2021-11-22 ENCOUNTER — Other Ambulatory Visit: Payer: Self-pay

## 2021-11-22 VITALS — BP 118/68 | Wt 218.0 lb

## 2021-11-22 DIAGNOSIS — Z369 Encounter for antenatal screening, unspecified: Secondary | ICD-10-CM

## 2021-11-22 DIAGNOSIS — Z3A23 23 weeks gestation of pregnancy: Secondary | ICD-10-CM

## 2021-11-22 DIAGNOSIS — Z3480 Encounter for supervision of other normal pregnancy, unspecified trimester: Secondary | ICD-10-CM

## 2021-11-22 DIAGNOSIS — O99213 Obesity complicating pregnancy, third trimester: Secondary | ICD-10-CM

## 2021-11-22 LAB — POCT URINALYSIS DIPSTICK OB
Glucose, UA: NEGATIVE
POC,PROTEIN,UA: NEGATIVE
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
## 2021-11-22 NOTE — Progress Notes (Signed)
ROB - no concerns. RM 4 °

## 2021-12-04 NOTE — L&D Delivery Note (Signed)
Obstetrical Delivery Note  ? ?Date of Delivery:   03/14/2022 ?Primary OB:   [facility] ?Gestational Age/EDD: [redacted]w[redacted]d (Dated by 10wkUS) ?Reason for Admission: Elective induction  ?Antepartum complications: ADHD, hx seizure disorder  ? ?Delivered By:   Siri Cole, CNM  ? ?Delivery Type:   spontaneous vaginal delivery  ?Delivery Details:   Arrived for elective induction VE 1/50/-3, received Cytotec x 2 doses then labored spontaneously. Received IV pain medication with good relief, had premature urge to push Nitrous started without relief, received a second dose of IV medications, was able to rest in between contractions but continued to have rectal pressure. Intermittently spontaneously bearing down since 2000. SROM for clear at 2014. This cnm needed for immediate attention to another pt, SVB of viable female over intact perineum at 2102 with Aggie Cosier RN present. Infant placed on mother's abd, spontaneous cry, HR >100, quick to pink up.  Placenta delivered spontaneously. Cord clamped and cut by dad. Bleeding stable, Infant skin to skin with mother, both mom and baby stable.  ?Anesthesia:    IV and Nitrous  ?Intrapartum complications: None ?GBS:    Negative ?Laceration:    none ?Episiotomy:    none ?Rectal exam:   deferred ?Placenta:    Delivered and expressed via active management. Intact: yes. To pathology: yes.  ?Delayed Cord Clamping: yes ?Estimated Blood Loss:  ? ?Baby:    Liveborn female, APGARs 8/9, weight pending gm ? ?Carie Caddy, CNM  ?[facility], [facility]  ?@TODAY @  ?9:47 PM  ? ?

## 2021-12-14 ENCOUNTER — Other Ambulatory Visit: Payer: Self-pay

## 2021-12-14 ENCOUNTER — Observation Stay
Admission: EM | Admit: 2021-12-14 | Discharge: 2021-12-14 | Disposition: A | Discharge status: Home / Self Care | Payer: 59 | Attending: Obstetrics & Gynecology | Admitting: Obstetrics & Gynecology

## 2021-12-14 ENCOUNTER — Encounter: Payer: Self-pay | Admitting: Obstetrics & Gynecology

## 2021-12-14 ENCOUNTER — Telehealth: Payer: Self-pay

## 2021-12-14 DIAGNOSIS — Z3A27 27 weeks gestation of pregnancy: Secondary | ICD-10-CM | POA: Diagnosis not present

## 2021-12-14 DIAGNOSIS — O212 Late vomiting of pregnancy: Secondary | ICD-10-CM | POA: Diagnosis not present

## 2021-12-14 DIAGNOSIS — Z20822 Contact with and (suspected) exposure to covid-19: Secondary | ICD-10-CM | POA: Diagnosis not present

## 2021-12-14 DIAGNOSIS — O219 Vomiting of pregnancy, unspecified: Secondary | ICD-10-CM | POA: Diagnosis present

## 2021-12-14 DIAGNOSIS — Z3480 Encounter for supervision of other normal pregnancy, unspecified trimester: Secondary | ICD-10-CM

## 2021-12-14 LAB — URINALYSIS, ROUTINE W REFLEX MICROSCOPIC
Bilirubin Urine: NEGATIVE
Glucose, UA: NEGATIVE mg/dL
Hgb urine dipstick: NEGATIVE
Ketones, ur: 80 mg/dL — AB
Nitrite: NEGATIVE
Protein, ur: 30 mg/dL — AB
Specific Gravity, Urine: 1.023 (ref 1.005–1.030)
pH: 6 (ref 5.0–8.0)

## 2021-12-14 LAB — CBC
HCT: 32.4 % — ABNORMAL LOW (ref 36.0–46.0)
Hemoglobin: 11 g/dL — ABNORMAL LOW (ref 12.0–15.0)
MCH: 28.3 pg (ref 26.0–34.0)
MCHC: 34 g/dL (ref 30.0–36.0)
MCV: 83.3 fL (ref 80.0–100.0)
Platelets: 211 10*3/uL (ref 150–400)
RBC: 3.89 MIL/uL (ref 3.87–5.11)
RDW: 12.6 % (ref 11.5–15.5)
WBC: 10 10*3/uL (ref 4.0–10.5)
nRBC: 0 % (ref 0.0–0.2)

## 2021-12-14 LAB — COMPREHENSIVE METABOLIC PANEL
ALT: 12 U/L (ref 0–44)
AST: 17 U/L (ref 15–41)
Albumin: 2.6 g/dL — ABNORMAL LOW (ref 3.5–5.0)
Alkaline Phosphatase: 55 U/L (ref 38–126)
Anion gap: 8 (ref 5–15)
BUN: 6 mg/dL (ref 6–20)
CO2: 24 mmol/L (ref 22–32)
Calcium: 8.1 mg/dL — ABNORMAL LOW (ref 8.9–10.3)
Chloride: 102 mmol/L (ref 98–111)
Creatinine, Ser: 0.49 mg/dL (ref 0.44–1.00)
GFR, Estimated: >60 mL/min (ref >60)
Glucose, Bld: 84 mg/dL (ref 70–99)
Potassium: 3.6 mmol/L (ref 3.5–5.1)
Sodium: 134 mmol/L — ABNORMAL LOW (ref 135–145)
Total Bilirubin: 0.5 mg/dL (ref 0.3–1.2)
Total Protein: 5.7 g/dL — ABNORMAL LOW (ref 6.5–8.1)

## 2021-12-14 LAB — RESP PANEL BY RT-PCR (FLU A&B, COVID) ARPGX2
Influenza A by PCR: NEGATIVE
Influenza B by PCR: NEGATIVE
SARS Coronavirus 2 by RT PCR: NEGATIVE

## 2021-12-14 MED ORDER — PROMETHAZINE HCL 25 MG/ML IJ SOLN
12.5000 mg | Freq: Once | INTRAMUSCULAR | Status: AC
  Administered 2021-12-14: 12.5 mg via INTRAVENOUS
  Filled 2021-12-14: qty 12.5

## 2021-12-14 MED ORDER — LACTATED RINGERS IV SOLN: INTRAVENOUS | Status: DC

## 2021-12-14 NOTE — Telephone Encounter (Signed)
Pt states she can't keep water or peanut butter crackers down; no other sxs; GFM.  Adv to go to UC as they can test her for covid and flu.  Adv probably not preg related; either she has eaten something that didn't agree with her or she has a stomach bug.  Adv the baby is going to get what the baby needs from what her body has stored up; we just don't want her to get dehydrated.

## 2021-12-14 NOTE — Discharge Summary (Signed)
RN reviewed discharge instructions with patient. Gave patient opportunity for questions. All questions answered at this time. Pt verbalized understanding. Pt discharged home with significant other.  °

## 2021-12-14 NOTE — Telephone Encounter (Signed)
Pt calling; since 2am has been throwing her guts up; can't keep anything down.  [phone]  lm I will call her back.

## 2021-12-14 NOTE — OB Triage Note (Signed)
Pt presents c/o vomiting that started yesterday. Pt has took zofran and it has not helped. Pt states that she feels like she has had fever, chills, cough, HA, and body aches as well. Pt denies taking a flu or covid test. Pt denies any bleeding or LOF. Pt reports positive fetal movement. VSS. Will continue to monitor.

## 2021-12-14 NOTE — Discharge Summary (Signed)
Please see final Progress Note.  TACORI WILLHOITE, CNM  12/14/2021 6:30 PM

## 2021-12-14 NOTE — Final Progress Note (Signed)
Final Progress Note  Patient ID: Sarah Gomez MRN: [id number] DOB/AGE: Dec 06, 1995 25 y.o.  Admit date: 12/14/2021 Admitting provider: Mirna MiresMargaret M Daphney Gomez, CNM Discharge date: 12/14/2021   Admission Diagnoses: Nausea and vomiting in pregnancy  Discharge Diagnoses:  Principal Problem:   Nausea and vomiting in pregnancy  Reactive fetal heart tones  History of Present Illness: The patient is a 26 y.o. female G2P1001 at 4848w0d who presents for complaint of three days of Nausea and vomiting, and an inability to keep down food or liquids..she has tried to take Zofran  ODTs, and this has not worked for her.  She denies any contractions. LOF, or vaginal bleeding. Her baby has been moving well.  Past Medical History:  Diagnosis Date   ADHD    Anxiety    Depression    Epilepsy (HCC)    Iron deficiency anemia 12/02/2019   Migraine    Mood disorder (HCC)    Seizures (HCC)     Past Surgical History:  Procedure Laterality Date   NO PAST SURGERIES      No current facility-administered medications on file prior to encounter.   Current Outpatient Medications on File Prior to Encounter  Medication Sig Dispense Refill   ondansetron (ZOFRAN-ODT) 4 MG disintegrating tablet Take by mouth.     azelastine (ASTELIN) 0.1 % nasal spray Place into the nose.     benzonatate (TESSALON) 200 MG capsule Take one cap TID PRN cough (Patient not taking: Reported on 10/14/2021) 30 capsule 0   cetirizine (ZYRTEC) 10 MG tablet Take by mouth. (Patient not taking: Reported on 12/14/2021)     fluticasone (FLONASE) 50 MCG/ACT nasal spray Place 2 sprays into both nostrils daily. (Patient not taking: Reported on 10/14/2021) 16 g 0   [DISCONTINUED] norgestimate-ethinyl estradiol (ORTHO-CYCLEN) 0.25-35 MG-MCG tablet Take 1 tablet by mouth daily. 3 Package 4   [DISCONTINUED] promethazine (PHENERGAN) 12.5 MG tablet Take 1 tablet (12.5 mg total) by mouth every 6 (six) hours as needed for nausea or vomiting. 30 tablet 0    [DISCONTINUED] sertraline (ZOLOFT) 25 MG tablet Take 1 tablet (25 mg total) by mouth daily. 30 tablet 11    No Known Allergies  Social History   Socioeconomic History   Marital status: Single    Spouse name: Not on file   Number of children: 1   Years of education: Not on file   Highest education level: 12th grade  Occupational History   Occupation: factory   Tobacco Use   Smoking status: Former   Smokeless tobacco: Never  Building services engineerVaping Use   Vaping Use: Former  Substance and Sexual Activity   Alcohol use: Not Currently   Drug use: Not Currently    Types: Marijuana   Sexual activity: Not Currently    Partners: Male    Birth control/protection: Implant  Other Topics Concern   Not on file  Social History Narrative   Right handed    Social Determinants of Health   Financial Resource Strain: Not on file  Food Insecurity: Not on file  Transportation Needs: Not on file  Physical Activity: Not on file  Stress: Not on file  Social Connections: Not on file  Intimate Partner Violence: Not on file    Family History  Problem Relation Age of Onset   Multiple sclerosis Mother    Hypertension Father    Heart Problems Sister      Review of Systems  Constitutional: Negative.   HENT: Negative.    Eyes: Negative.   Respiratory:  Negative.    Cardiovascular: Negative.   Gastrointestinal:  Positive for nausea and vomiting.  Genitourinary: Negative.   Musculoskeletal: Negative.   Skin: Negative.   Neurological: Negative.   Endo/Heme/Allergies: Negative.   Psychiatric/Behavioral: Negative.         Hx of ADHD, anxiety and depression    Physical Exam: BP 109/64 (BP Location: Left Arm)    Pulse (!) 103    Temp 98.8 F (37.1 C) (Oral)    Resp 18    Ht 5\' 5"  (1.651 m)    Wt 98.9 kg    LMP  (LMP Unknown)    SpO2 100%    BMI 36.28 kg/m   Physical Exam Constitutional:      Appearance: Normal appearance. She is obese.  Genitourinary:     Genitourinary Comments: Pelvic exam deferred.  Reassuring FHTs.  HENT:     Head: Normocephalic and atraumatic.  Cardiovascular:     Rate and Rhythm: Normal rate and regular rhythm.  Pulmonary:     Effort: Pulmonary effort is normal.     Breath sounds: Normal breath sounds.  Abdominal:     Palpations: Abdomen is soft.  Skin:    General: Skin is warm and dry.  Psychiatric:        Mood and Affect: Mood normal.        Behavior: Behavior normal.    Consults: None  Significant Findings/ Diagnostic Studies: labs:  Results for orders placed or performed during the hospital encounter of 12/14/21 (from the past 24 hour(s))  Resp Panel by RT-PCR (Flu A&B, Covid) Nasopharyngeal Swab     Status: None   Collection Time: 12/14/21  1:29 PM   Specimen: Nasopharyngeal Swab; Nasopharyngeal(NP) swabs in vial transport medium  Result Value Ref Range   SARS Coronavirus 2 by RT PCR NEGATIVE NEGATIVE   Influenza A by PCR NEGATIVE NEGATIVE   Influenza B by PCR NEGATIVE NEGATIVE  Urinalysis, Routine w reflex microscopic Urine, Clean Catch     Status: Abnormal   Collection Time: 12/14/21  2:08 PM  Result Value Ref Range   Color, Urine AMBER (A) YELLOW   APPearance HAZY (A) CLEAR   Specific Gravity, Urine 1.023 1.005 - 1.030   pH 6.0 5.0 - 8.0   Glucose, UA NEGATIVE NEGATIVE mg/dL   Hgb urine dipstick NEGATIVE NEGATIVE   Bilirubin Urine NEGATIVE NEGATIVE   Ketones, ur 80 (A) NEGATIVE mg/dL   Protein, ur 30 (A) NEGATIVE mg/dL   Nitrite NEGATIVE NEGATIVE   Leukocytes,Ua MODERATE (A) NEGATIVE   RBC / HPF 0-5 0 - 5 RBC/hpf   WBC, UA 6-10 0 - 5 WBC/hpf   Bacteria, UA RARE (A) NONE SEEN   Squamous Epithelial / LPF 11-20 0 - 5   Mucus PRESENT   CBC     Status: Abnormal   Collection Time: 12/14/21  2:23 PM  Result Value Ref Range   WBC 10.0 4.0 - 10.5 K/uL   RBC 3.89 3.87 - 5.11 MIL/uL   Hemoglobin 11.0 (L) 12.0 - 15.0 g/dL   HCT 02/11/22 (L) 01.0 - 27.2 %   MCV 83.3 80.0 - 100.0 fL   MCH 28.3 26.0 - 34.0 pg   MCHC 34.0 30.0 - 36.0 g/dL   RDW  53.6 64.4 - 03.4 %   Platelets 211 150 - 400 K/uL   nRBC 0.0 0.0 - 0.2 %  Comprehensive metabolic panel     Status: Abnormal   Collection Time: 12/14/21  2:23 PM  Result Value  Ref Range   Sodium 134 (L) 135 - 145 mmol/L   Potassium 3.6 3.5 - 5.1 mmol/L   Chloride 102 98 - 111 mmol/L   CO2 24 22 - 32 mmol/L   Glucose, Bld 84 70 - 99 mg/dL   BUN 6 6 - 20 mg/dL   Creatinine, Ser 1.610.49 0.44 - 1.00 mg/dL   Calcium 8.1 (L) 8.9 - 10.3 mg/dL   Total Protein 5.7 (L) 6.5 - 8.1 g/dL   Albumin 2.6 (L) 3.5 - 5.0 g/dL   AST 17 15 - 41 U/L   ALT 12 0 - 44 U/L   Alkaline Phosphatase 55 38 - 126 U/L   Total Bilirubin 0.5 0.3 - 1.2 mg/dL   GFR, Estimated >09>60 >60>60 mL/min   Anion gap 8 5 - 15     Procedures: EFM NST Baseline FHR: 140 beats/min Variability: acceptable for this gestational age Accelerations: present Decelerations: absent Tocometry: no contractions noted  Interpretation:  INDICATIONS: rule out uterine contractions RESULTS:  Monitoring was performed  and the baseline was WNL. Due to the gestational age, an NST standard is not applied; rather the [facility]HTs are reassuring .  Hospital Course: The patient was admitted to Labor and Delivery Triage for observation. She was placed on the fetal monitor and it was noted that the [facility] were WNL wit a normal baseline and demonstrated variability and no decelerations. Labwork was drawn, and found tohave normal results. She was rehyrated with IV fluid, and given one dose of IV Phenergan. During her stay, she did not vomit, and after receiving her IV hydration, reported feeling much better. She is discharged home to advance her diet as tolerated, [facility]  Discharge Condition: good  Disposition: Discharge disposition: 01-Home or Self Care       Diet: Clear liquid diet, advance as tolerated  Discharge Activity: Activity as tolerated  Discharge Instructions     Notify physician for a  general feeling that "something is not right"   Complete by: As directed    Notify physician for increase or change in vaginal discharge   Complete by: As directed    Notify physician for intestinal cramps, with or without diarrhea, sometimes described as "gas pain"   Complete by: As directed    Notify physician for leaking of fluid   Complete by: As directed    Notify physician for low, dull backache, unrelieved by heat or Tylenol   Complete by: As directed    Notify physician for menstrual like cramps   Complete by: As directed    Notify physician for pelvic pressure   Complete by: As directed    Notify physician for uterine contractions.  These may be painless and feel like the uterus is tightening or the baby is  "balling up"   Complete by: As directed    Notify physician for vaginal bleeding   Complete by: As directed    PRETERM LABOR:  Includes any of the follwing symptoms that occur between 20 - [redacted] weeks gestation.  If these symptoms are not stopped, preterm labor can result in preterm delivery, placing your baby at risk   Complete by: As directed       Allergies as of 12/14/2021   No Known Allergies      Medication List     TAKE these medications    azelastine 0.1 % nasal spray Commonly known as: ASTELIN Place into the nose.   benzonatate  200 MG capsule Commonly known as: TESSALON Take one cap TID PRN cough   cetirizine 10 MG tablet Commonly known as: ZYRTEC Take by mouth.   fluticasone 50 MCG/ACT nasal spray Commonly known as: FLONASE Place 2 sprays into both nostrils daily.   ondansetron 4 MG disintegrating tablet Commonly known as: ZOFRAN-ODT Take by mouth.         Total time spent taking care of this patient: 35 minutes  Signed: Mirna Mires, CNM  12/14/2021, 6:31 PM

## 2021-12-20 ENCOUNTER — Other Ambulatory Visit: Payer: Medicaid Other

## 2021-12-20 ENCOUNTER — Ambulatory Visit (INDEPENDENT_AMBULATORY_CARE_PROVIDER_SITE_OTHER): Payer: Medicaid Other | Admitting: Obstetrics

## 2021-12-20 ENCOUNTER — Encounter: Payer: Medicaid Other | Admitting: Obstetrics & Gynecology

## 2021-12-20 ENCOUNTER — Other Ambulatory Visit: Payer: Self-pay

## 2021-12-20 VITALS — BP 122/74 | Wt 221.0 lb

## 2021-12-20 DIAGNOSIS — E669 Obesity, unspecified: Secondary | ICD-10-CM

## 2021-12-20 DIAGNOSIS — O99213 Obesity complicating pregnancy, third trimester: Secondary | ICD-10-CM

## 2021-12-20 DIAGNOSIS — Z369 Encounter for antenatal screening, unspecified: Secondary | ICD-10-CM

## 2021-12-20 DIAGNOSIS — Z3A27 27 weeks gestation of pregnancy: Secondary | ICD-10-CM

## 2021-12-20 DIAGNOSIS — F909 Attention-deficit hyperactivity disorder, unspecified type: Secondary | ICD-10-CM

## 2021-12-20 DIAGNOSIS — Z3480 Encounter for supervision of other normal pregnancy, unspecified trimester: Secondary | ICD-10-CM

## 2021-12-20 DIAGNOSIS — F1290 Cannabis use, unspecified, uncomplicated: Secondary | ICD-10-CM

## 2021-12-20 DIAGNOSIS — O99212 Obesity complicating pregnancy, second trimester: Secondary | ICD-10-CM

## 2021-12-20 DIAGNOSIS — G40909 Epilepsy, unspecified, not intractable, without status epilepticus: Secondary | ICD-10-CM

## 2021-12-20 DIAGNOSIS — O99342 Other mental disorders complicating pregnancy, second trimester: Secondary | ICD-10-CM

## 2021-12-20 DIAGNOSIS — O99012 Anemia complicating pregnancy, second trimester: Secondary | ICD-10-CM

## 2021-12-20 DIAGNOSIS — D649 Anemia, unspecified: Secondary | ICD-10-CM

## 2021-12-20 DIAGNOSIS — O99352 Diseases of the nervous system complicating pregnancy, second trimester: Secondary | ICD-10-CM

## 2021-12-20 NOTE — Progress Notes (Signed)
No vb. No lof. 28 week labs today.  
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
## 2021-12-21 LAB — 28 WEEK RH+PANEL
Basophils Absolute: 0 10*3/uL (ref 0.0–0.2)
Basos: 0 %
EOS (ABSOLUTE): 0.1 10*3/uL (ref 0.0–0.4)
Eos: 2 %
Gestational Diabetes Screen: 113 mg/dL (ref 70–139)
HIV Screen 4th Generation wRfx: NONREACTIVE
Hematocrit: 35.1 % (ref 34.0–46.6)
Hemoglobin: 11.7 g/dL (ref 11.1–15.9)
Immature Grans (Abs): 0.1 10*3/uL (ref 0.0–0.1)
Immature Granulocytes: 1 %
Lymphocytes Absolute: 1.8 10*3/uL (ref 0.7–3.1)
Lymphs: 23 %
MCH: 28.2 pg (ref 26.6–33.0)
MCHC: 33.3 g/dL (ref 31.5–35.7)
MCV: 85 fL (ref 79–97)
Monocytes Absolute: 0.4 10*3/uL (ref 0.1–0.9)
Monocytes: 5 %
Neutrophils Absolute: 5.2 10*3/uL (ref 1.4–7.0)
Neutrophils: 69 %
Platelets: 217 10*3/uL (ref 150–450)
RBC: 4.15 x10E6/uL (ref 3.77–5.28)
RDW: 12.8 % (ref 11.7–15.4)
RPR Ser Ql: NONREACTIVE
WBC: 7.6 10*3/uL (ref 3.4–10.8)

## 2021-12-22 LAB — CULTURE, OB URINE

## 2021-12-22 LAB — URINE CULTURE, OB REFLEX

## 2022-01-04 ENCOUNTER — Encounter: Payer: Self-pay | Admitting: Licensed Practical Nurse

## 2022-01-04 ENCOUNTER — Other Ambulatory Visit: Payer: Self-pay

## 2022-01-04 ENCOUNTER — Ambulatory Visit (INDEPENDENT_AMBULATORY_CARE_PROVIDER_SITE_OTHER): Payer: 59 | Admitting: Licensed Practical Nurse

## 2022-01-04 VITALS — BP 118/62 | Wt 218.0 lb

## 2022-01-04 DIAGNOSIS — Z3483 Encounter for supervision of other normal pregnancy, third trimester: Secondary | ICD-10-CM

## 2022-01-04 DIAGNOSIS — Z3A30 30 weeks gestation of pregnancy: Secondary | ICD-10-CM

## 2022-01-04 DIAGNOSIS — E669 Obesity, unspecified: Secondary | ICD-10-CM

## 2022-01-04 DIAGNOSIS — Z23 Encounter for immunization: Secondary | ICD-10-CM

## 2022-01-04 DIAGNOSIS — Z3480 Encounter for supervision of other normal pregnancy, unspecified trimester: Secondary | ICD-10-CM
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
## 2022-01-17 ENCOUNTER — Other Ambulatory Visit: Payer: Self-pay

## 2022-01-17 ENCOUNTER — Ambulatory Visit (INDEPENDENT_AMBULATORY_CARE_PROVIDER_SITE_OTHER): Payer: 59 | Admitting: Licensed Practical Nurse

## 2022-01-17 ENCOUNTER — Encounter: Payer: Self-pay | Admitting: Licensed Practical Nurse

## 2022-01-17 VITALS — BP 122/72 | Wt 222.0 lb

## 2022-01-17 DIAGNOSIS — Z3A31 31 weeks gestation of pregnancy: Secondary | ICD-10-CM

## 2022-01-17 DIAGNOSIS — Z3480 Encounter for supervision of other normal pregnancy, unspecified trimester: Secondary | ICD-10-CM
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
## 2022-01-18 ENCOUNTER — Ambulatory Visit
Admission: RE | Admit: 2022-01-18 | Discharge: 2022-01-18 | Disposition: A | Discharge status: Home / Self Care | Payer: 59 | Source: Ambulatory Visit | Attending: Licensed Practical Nurse | Admitting: Licensed Practical Nurse

## 2022-01-18 DIAGNOSIS — Z3A32 32 weeks gestation of pregnancy: Secondary | ICD-10-CM | POA: Diagnosis not present

## 2022-01-18 DIAGNOSIS — Z3483 Encounter for supervision of other normal pregnancy, third trimester: Secondary | ICD-10-CM

## 2022-01-18 DIAGNOSIS — Z362 Encounter for other antenatal screening follow-up: Secondary | ICD-10-CM | POA: Insufficient documentation

## 2022-01-18 DIAGNOSIS — E669 Obesity, unspecified: Secondary | ICD-10-CM

## 2022-01-20 ENCOUNTER — Other Ambulatory Visit: Payer: Self-pay | Admitting: Licensed Practical Nurse

## 2022-01-20 DIAGNOSIS — Z3480 Encounter for supervision of other normal pregnancy, unspecified trimester: Secondary | ICD-10-CM

## 2022-01-20 DIAGNOSIS — Z6836 Body mass index (BMI) 36.0-36.9, adult: Secondary | ICD-10-CM

## 2022-01-27 ENCOUNTER — Other Ambulatory Visit: Payer: Self-pay

## 2022-01-27 ENCOUNTER — Telehealth: Payer: Self-pay

## 2022-01-27 ENCOUNTER — Ambulatory Visit (INDEPENDENT_AMBULATORY_CARE_PROVIDER_SITE_OTHER): Payer: 59 | Admitting: Obstetrics

## 2022-01-27 VITALS — BP 120/78 | Wt 218.0 lb

## 2022-01-27 DIAGNOSIS — Z3480 Encounter for supervision of other normal pregnancy, unspecified trimester: Secondary | ICD-10-CM

## 2022-01-27 DIAGNOSIS — Z3A33 33 weeks gestation of pregnancy: Secondary | ICD-10-CM

## 2022-01-27 LAB — POCT URINALYSIS DIPSTICK
Bilirubin, UA: NEGATIVE
Blood, UA: NEGATIVE
Glucose, UA: NEGATIVE
Ketones, UA: NEGATIVE
Leukocytes, UA: NEGATIVE
Nitrite, UA: NEGATIVE
Protein, UA: NEGATIVE
Spec Grav, UA: 1.015 (ref 1.010–1.025)
Urobilinogen, UA: NEGATIVE E.U./dL — AB
pH, UA: 7 (ref 5.0–8.0)

## 2022-01-27 NOTE — Telephone Encounter (Signed)
Pt calling; 33wks; is hurting between legs; normal? Come in and be ck'd?  [phone]  Pt states she has sharp pain in her area; states she is getting hard and tight but not often; yesterday was bad as she was working and walking a lot.  Tx'd to AM for scheduling.
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
## 2022-01-27 NOTE — Progress Notes (Signed)
Work on [employer], pt having really sharp pain in vaginal area. A lot of pressure. No vb. No lof.

## 2022-01-31 ENCOUNTER — Encounter: Payer: 59 | Admitting: Obstetrics

## 2022-02-07 ENCOUNTER — Encounter: Payer: 59 | Admitting: Obstetrics

## 2022-02-08 ENCOUNTER — Ambulatory Visit (INDEPENDENT_AMBULATORY_CARE_PROVIDER_SITE_OTHER): Payer: 59 | Admitting: Obstetrics

## 2022-02-08 ENCOUNTER — Other Ambulatory Visit: Payer: Self-pay

## 2022-02-08 VITALS — BP 126/74 | Wt 222.0 lb

## 2022-02-08 DIAGNOSIS — Z3480 Encounter for supervision of other normal pregnancy, unspecified trimester: Secondary | ICD-10-CM

## 2022-02-08 DIAGNOSIS — E669 Obesity, unspecified: Secondary | ICD-10-CM

## 2022-02-08 DIAGNOSIS — Z113 Encounter for screening for infections with a predominantly sexual mode of transmission: Secondary | ICD-10-CM
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
## 2022-02-08 NOTE — Progress Notes (Signed)
No vb. No lof.  

## 2022-02-16 ENCOUNTER — Other Ambulatory Visit (HOSPITAL_COMMUNITY)
Admission: RE | Admit: 2022-02-16 | Discharge: 2022-02-16 | Disposition: A | Discharge status: Home / Self Care | Payer: 59 | Source: Ambulatory Visit | Attending: Advanced Practice Midwife | Admitting: Advanced Practice Midwife

## 2022-02-16 ENCOUNTER — Encounter: Payer: Self-pay | Admitting: Advanced Practice Midwife

## 2022-02-16 ENCOUNTER — Ambulatory Visit (INDEPENDENT_AMBULATORY_CARE_PROVIDER_SITE_OTHER): Payer: 59 | Admitting: Advanced Practice Midwife

## 2022-02-16 ENCOUNTER — Other Ambulatory Visit: Payer: Self-pay

## 2022-02-16 VITALS — BP 120/80 | Wt 225.0 lb

## 2022-02-16 DIAGNOSIS — Z3A36 36 weeks gestation of pregnancy: Secondary | ICD-10-CM

## 2022-02-16 DIAGNOSIS — Z3685 Encounter for antenatal screening for Streptococcus B: Secondary | ICD-10-CM

## 2022-02-16 DIAGNOSIS — Z3483 Encounter for supervision of other normal pregnancy, third trimester: Secondary | ICD-10-CM

## 2022-02-16 DIAGNOSIS — Z369 Encounter for antenatal screening, unspecified: Secondary | ICD-10-CM

## 2022-02-16 DIAGNOSIS — Z113 Encounter for screening for infections with a predominantly sexual mode of transmission: Secondary | ICD-10-CM | POA: Diagnosis present
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
## 2022-02-18 LAB — STREP GP B NAA: Strep Gp B NAA: NEGATIVE

## 2022-02-20 LAB — CERVICOVAGINAL ANCILLARY ONLY
Chlamydia: NEGATIVE
Comment: NEGATIVE
Comment: NEGATIVE
Comment: NORMAL
Neisseria Gonorrhea: NEGATIVE
Trichomonas: NEGATIVE

## 2022-02-22 ENCOUNTER — Ambulatory Visit (INDEPENDENT_AMBULATORY_CARE_PROVIDER_SITE_OTHER): Payer: 59 | Admitting: Obstetrics

## 2022-02-22 ENCOUNTER — Other Ambulatory Visit: Payer: Self-pay

## 2022-02-22 VITALS — BP 122/70 | Wt 222.0 lb

## 2022-02-22 DIAGNOSIS — Z3483 Encounter for supervision of other normal pregnancy, third trimester: Secondary | ICD-10-CM | POA: Diagnosis not present

## 2022-02-22 DIAGNOSIS — Z3A37 37 weeks gestation of pregnancy: Secondary | ICD-10-CM

## 2022-02-22 DIAGNOSIS — O99213 Obesity complicating pregnancy, third trimester: Secondary | ICD-10-CM | POA: Diagnosis not present

## 2022-02-22 DIAGNOSIS — E669 Obesity, unspecified: Secondary | ICD-10-CM | POA: Diagnosis not present
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
## 2022-02-22 NOTE — Progress Notes (Signed)
No vb. No lof. NST today °

## 2022-02-26 ENCOUNTER — Other Ambulatory Visit: Payer: Self-pay

## 2022-02-26 ENCOUNTER — Ambulatory Visit
Admission: EM | Admit: 2022-02-26 | Discharge: 2022-02-26 | Disposition: A | Discharge status: Home / Self Care | Payer: 59 | Attending: Physician Assistant | Admitting: Physician Assistant

## 2022-02-26 DIAGNOSIS — Z3A37 37 weeks gestation of pregnancy: Secondary | ICD-10-CM | POA: Diagnosis not present

## 2022-02-26 DIAGNOSIS — J9801 Acute bronchospasm: Secondary | ICD-10-CM

## 2022-02-26 DIAGNOSIS — J209 Acute bronchitis, unspecified: Secondary | ICD-10-CM | POA: Diagnosis not present

## 2022-02-26 DIAGNOSIS — R062 Wheezing: Secondary | ICD-10-CM

## 2022-02-26 DIAGNOSIS — R051 Acute cough: Secondary | ICD-10-CM

## 2022-02-26 DIAGNOSIS — O99513 Diseases of the respiratory system complicating pregnancy, third trimester: Secondary | ICD-10-CM | POA: Diagnosis not present

## 2022-02-26 MED ORDER — FLUTICASONE PROPIONATE HFA 110 MCG/ACT IN AERO
1.0000 | INHALATION_SPRAY | Freq: Two times a day (BID) | RESPIRATORY_TRACT | 0 refills | Status: DC
Start: 2022-02-26 — End: 2022-03-08

## 2022-02-26 MED ORDER — ALBUTEROL SULFATE HFA 108 (90 BASE) MCG/ACT IN AERS: 1.0000 | INHALATION_SPRAY | Freq: Four times a day (QID) | RESPIRATORY_TRACT | 0 refills | Status: DC | PRN
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
## 2022-02-26 NOTE — ED Triage Notes (Signed)
Patient is here for "Cough", persistent since "Monday". OTC medication not working "now wheezing". No fever.  ?

## 2022-02-26 NOTE — Discharge Instructions (Signed)
I believe that you have a bronchitis infection that is causing wheezing in your lungs.  I do not see any reason for antibiotics.  Please use albuterol inhaler every 6 hours as needed.  This will likely make your heart race and make you feel a bit jittery.  Start fluticasone twice daily.  Make sure to rinse her mouth following use of this medication and spit out water to prevent thrush.  Please inform your OB/GYN that you are taking these medications.  If you have any worsening symptoms you need to return for reevaluation including productive cough, fever, shortness of breath, nausea/vomiting. ?

## 2022-02-28 ENCOUNTER — Ambulatory Visit
Admission: RE | Admit: 2022-02-28 | Discharge: 2022-02-28 | Disposition: A | Discharge status: Home / Self Care | Payer: 59 | Source: Ambulatory Visit | Attending: Licensed Practical Nurse | Admitting: Licensed Practical Nurse

## 2022-02-28 ENCOUNTER — Encounter: Payer: 59 | Admitting: Advanced Practice Midwife

## 2022-02-28 ENCOUNTER — Other Ambulatory Visit: Payer: Self-pay

## 2022-02-28 DIAGNOSIS — Z3689 Encounter for other specified antenatal screening: Secondary | ICD-10-CM | POA: Diagnosis not present

## 2022-02-28 DIAGNOSIS — Z3480 Encounter for supervision of other normal pregnancy, unspecified trimester: Secondary | ICD-10-CM | POA: Insufficient documentation

## 2022-02-28 DIAGNOSIS — Z3A37 37 weeks gestation of pregnancy: Secondary | ICD-10-CM | POA: Diagnosis not present

## 2022-02-28 DIAGNOSIS — Z6836 Body mass index (BMI) 36.0-36.9, adult: Secondary | ICD-10-CM | POA: Insufficient documentation

## 2022-03-02 ENCOUNTER — Ambulatory Visit (INDEPENDENT_AMBULATORY_CARE_PROVIDER_SITE_OTHER): Payer: 59 | Admitting: Advanced Practice Midwife

## 2022-03-02 ENCOUNTER — Encounter: Payer: Self-pay | Admitting: Advanced Practice Midwife

## 2022-03-02 VITALS — BP 110/70 | HR 92 | Wt 222.4 lb

## 2022-03-02 DIAGNOSIS — O99213 Obesity complicating pregnancy, third trimester: Secondary | ICD-10-CM | POA: Diagnosis not present

## 2022-03-02 DIAGNOSIS — Z3A38 38 weeks gestation of pregnancy: Secondary | ICD-10-CM

## 2022-03-02 DIAGNOSIS — Z3483 Encounter for supervision of other normal pregnancy, third trimester: Secondary | ICD-10-CM | POA: Diagnosis not present

## 2022-03-02 LAB — FETAL NONSTRESS TEST

## 2022-03-02 NOTE — Progress Notes (Signed)
ROB: She is doing well, no new concerns today. Unable to leave urine at intake.  ?

## 2022-03-02 NOTE — H&P (Signed)
OB History & Physical  ? ?History of Present Illness:  ?Chief Complaint: elective induction of labor ? ?HPI:  ?Sarah Gomez is a 26 y.o. G58P1001 female at [redacted]w[redacted]d dated by 10 week ultrasound.  Her pregnancy has been complicated by ADHD, seizure disorder not on medication, anemia, obesity.   ? ?She denies contractions.   She denies leakage of fluid.   She denies vaginal bleeding.   She reports fetal movement.   ? ?Total weight gain for pregnancy: 6 lb 6.4 oz (2.903 kg)  ? ?Obstetrical Problem List: ?pregnancy2 Problems (from 08/03/21 to present)   ? ? Problem Noted Resolved  ? Supervision of other normal pregnancy, antepartum 08/04/2021 by Tresea Mall, CNM No  ? Overview Addendum 01/04/2022  4:28 PM by Ellwood Sayers, CNM  ?   ?Nursing Staff Provider  ?Office Location  [facility] Dating  10 week [place]  ?Language  English Anatomy US  Complete/Normal  ?Flu Vaccine   Genetic Screen  NIPS: normal xx  ?TDaP vaccine   01/04/2022 Hgb A1C or  ?GTT Early :101 ?Third trimester : 59  ?Covid    LAB RESULTS   ?Rhogam  NA Blood Type O/Positive/-- (09/07 1106)   ?Feeding Plan Breast/Bott Antibody Negative (09/07 1106)  ?Contraception  Rubella 1.26 (09/07 1106)  ?Circumcision  RPR Non Reactive (09/07 1106)   ?Pediatrician  [place], [place]  HBsAg Negative (09/07 1106)   ?Support Person Rayna Sexton HIV Non Reactive (09/07 1106)  ?Prenatal Classes multip Varicella Non-immune  ?  GBS  (For PCN allergy, check sensitivities)   ?BTL Consent     ?VBAC Consent NA Pap  2021 negative  ?  Hgb Electro    ?Pelvis Tested 8#1oz CF neg  ?   SMA neg  ?  Fragile-X neg  ?BMI >=35.0-39.9 ?[ x] early 1h gtt -  ?[x ] u/s for dating [ ]   ?[ ]  nutritional goals ?[ ]  folic acid 1mg  ?[ ]  bASA (>12 weeks) ?[ ]  consider nutrition consult ?[ ]  consider maternal EKG 1st trimester ?[ ]  Growth u/s 28 [ ] , 32 [ ] , 36 weeks [ ]  ?[ ]  NST/AFI weekly 37+ weeks (37[] , 38[] , 39[] , 40[] ) ?[ ]  IOL by 41 weeks (scheduled, prn [] ) ?  ?  ? ?  ?  ? ?Maternal Medical History:   ? ?Past Medical History:  ?Diagnosis Date  ? ADHD   ? Anxiety   ? Depression   ? Epilepsy (HCC)   ? Iron deficiency anemia 12/02/2019  ? Migraine   ? Mood disorder (HCC)   ? Seizures (HCC)   ? ? ?Past Surgical History:  ?Procedure Laterality Date  ? NO PAST SURGERIES    ? ? ?No Known Allergies ? ?Prior to Admission medications   ?Medication Sig Start Date End Date Taking? Authorizing Provider  ?albuterol (VENTOLIN HFA) 108 (90 Base) MCG/ACT inhaler Inhale 1-2 puffs into the lungs every 6 (six) hours as needed for wheezing or shortness of breath. 02/26/22  Yes Raspet, Erin K, PA-C  ?fluticasone (FLOVENT HFA) 110 MCG/ACT inhaler Inhale 1 puff into the lungs in the morning and at bedtime. 02/26/22  Yes Raspet, , PA-C  ?norgestimate-ethinyl estradiol (ORTHO-CYCLEN) 0.25-35 MG-MCG tablet Take 1 tablet by mouth daily. 02/03/20 08/29/20  , CNM  ?promethazine (PHENERGAN) 12.5 MG tablet Take 1 tablet (12.5 mg total) by mouth every 6 (six) hours as needed for nausea or vomiting. 06/13/19 07/30/19  , MD  ?sertraline (ZOLOFT) 25 MG tablet Take 1 tablet (25  mg total) by mouth daily. 02/03/20 08/29/20  Tresea Mall, CNM  ? ? ?OB History  ?Gravida Para Term Preterm AB Living  ?2 1 1     1   ?SAB IAB Ectopic Multiple Live Births  ?      0 1  ?  ?# Outcome Date GA Lbr Len/2nd Weight Sex Delivery Anes PTL Lv  ?2 Current           ?1 Term 12/22/19 [redacted]w[redacted]d / 00:12 8 lb 0.8 oz (3.65 kg) F Vag-Spont None  LIV  ? ? ?Prenatal care site: [facility] ? ?Social History: She  reports that she has quit smoking. She has never used smokeless tobacco. She reports that she does not currently use alcohol. She reports that she does not currently use drugs after having used the following drugs: Marijuana. ? ?Family History: family history includes Heart Problems in her sister; Hypertension in her father; Multiple sclerosis in her mother.  ? ? ?Review of Systems:  ?Review of Systems  ?Constitutional:  Negative for chills and  fever.  ?HENT:  Negative for congestion, ear discharge, ear pain, hearing loss, sinus pain and sore throat.   ?Eyes:  Negative for blurred vision and double vision.  ?Respiratory:  Negative for cough, shortness of breath and wheezing.   ?Cardiovascular:  Negative for chest pain, palpitations and leg swelling.  ?Gastrointestinal:  Negative for abdominal pain, blood in stool, constipation, diarrhea, heartburn, melena, nausea and vomiting.  ?Genitourinary:  Negative for dysuria, flank pain, frequency, hematuria and urgency.  ?Musculoskeletal:  Negative for back pain, joint pain and myalgias.  ?Skin:  Negative for itching and rash.  ?Neurological:  Negative for dizziness, tingling, tremors, sensory change, speech change, focal weakness, seizures, loss of consciousness, weakness and headaches.  ?Endo/Heme/Allergies:  Negative for environmental allergies. Does not bruise/bleed easily.  ?Psychiatric/Behavioral:  Negative for depression, hallucinations, memory loss, substance abuse and suicidal ideas. The patient is not nervous/anxious and does not have insomnia.    ? ?Physical Exam:  ?BP 110/70   Pulse 92   Wt 222 lb 6.4 oz (100.9 kg)   LMP  (LMP Unknown)   BMI 37.01 kg/m?   ?Constitutional: Well nourished, well developed female in no acute distress.  ?HEENT: normal ?Skin: Warm and dry.  ?Cardiovascular: Regular rate and rhythm.   ?Extremity:  no edema   ?Respiratory: Clear to auscultation bilateral. Normal respiratory effort ?Abdomen: FHT present ?Back: no CVAT ?Neuro: DTRs 2+, Cranial nerves grossly intact ?Psych: Alert and Oriented x3. No memory deficits. Normal mood and affect.  ?MS: normal gait, normal bilateral lower extremity ROM/strength/stability. ? ?Pelvic exam: deferred ? ?Baseline FHR: 125 beats/min   Variability: moderate   Accelerations: present   Decelerations: absent ?Contractions: not evaluated ?Overall assessment: reassuring ? ? ?Lab Results  ?Component Value Date  ? SARSCOV2NAA NEGATIVE 12/14/2021   ? ? ?Assessment:  ?Sarah Gomez is a 26 y.o. G41P1001 female at [redacted]w[redacted]d with elective induction of labor.  ? ?Plan:  ?Admit to Labor & Delivery  ?CBC, T&S, Clrs, IVF ?GBS negative.   ?Fetal well-being: Category I ?Begin induction based on admission exam ?  ? ?[redacted]w[redacted]d, CNM ?03/02/2022 4:39 PM  ?  ?
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
## 2022-03-08 ENCOUNTER — Ambulatory Visit (INDEPENDENT_AMBULATORY_CARE_PROVIDER_SITE_OTHER): Payer: 59 | Admitting: Advanced Practice Midwife

## 2022-03-08 ENCOUNTER — Encounter: Payer: Self-pay | Admitting: Advanced Practice Midwife

## 2022-03-08 VITALS — BP 100/70 | Wt 226.0 lb

## 2022-03-08 DIAGNOSIS — O99213 Obesity complicating pregnancy, third trimester: Secondary | ICD-10-CM | POA: Diagnosis not present

## 2022-03-08 DIAGNOSIS — O99353 Diseases of the nervous system complicating pregnancy, third trimester: Secondary | ICD-10-CM

## 2022-03-08 DIAGNOSIS — G40909 Epilepsy, unspecified, not intractable, without status epilepticus: Secondary | ICD-10-CM

## 2022-03-08 DIAGNOSIS — Z3480 Encounter for supervision of other normal pregnancy, unspecified trimester: Secondary | ICD-10-CM | POA: Diagnosis not present

## 2022-03-08 DIAGNOSIS — Z3A39 39 weeks gestation of pregnancy: Secondary | ICD-10-CM | POA: Diagnosis not present
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
## 2022-03-08 NOTE — Progress Notes (Signed)
ROB- cervix check, no concerns 

## 2022-03-14 ENCOUNTER — Inpatient Hospital Stay
Admission: RE | Admit: 2022-03-14 | Discharge: 2022-03-16 | DRG: 807 | Disposition: A | Discharge status: Home / Self Care | Payer: 59 | Attending: Obstetrics and Gynecology | Admitting: Obstetrics and Gynecology

## 2022-03-14 ENCOUNTER — Encounter: Payer: Self-pay | Admitting: Advanced Practice Midwife

## 2022-03-14 ENCOUNTER — Encounter: Payer: Self-pay | Admitting: Oncology

## 2022-03-14 ENCOUNTER — Other Ambulatory Visit: Payer: Self-pay

## 2022-03-14 DIAGNOSIS — Z3490 Encounter for supervision of normal pregnancy, unspecified, unspecified trimester: Secondary | ICD-10-CM | POA: Diagnosis present

## 2022-03-14 DIAGNOSIS — F909 Attention-deficit hyperactivity disorder, unspecified type: Secondary | ICD-10-CM

## 2022-03-14 DIAGNOSIS — O99354 Diseases of the nervous system complicating childbirth: Secondary | ICD-10-CM

## 2022-03-14 DIAGNOSIS — Z3A39 39 weeks gestation of pregnancy: Secondary | ICD-10-CM | POA: Diagnosis not present

## 2022-03-14 DIAGNOSIS — G43909 Migraine, unspecified, not intractable, without status migrainosus: Secondary | ICD-10-CM

## 2022-03-14 DIAGNOSIS — O26893 Other specified pregnancy related conditions, third trimester: Secondary | ICD-10-CM | POA: Diagnosis present

## 2022-03-14 DIAGNOSIS — O99344 Other mental disorders complicating childbirth: Secondary | ICD-10-CM

## 2022-03-14 LAB — CBC
HCT: 35.6 % — ABNORMAL LOW (ref 36.0–46.0)
Hemoglobin: 11 g/dL — ABNORMAL LOW (ref 12.0–15.0)
MCH: 24 pg — ABNORMAL LOW (ref 26.0–34.0)
MCHC: 30.9 g/dL (ref 30.0–36.0)
MCV: 77.6 fL — ABNORMAL LOW (ref 80.0–100.0)
Platelets: 268 10*3/uL (ref 150–400)
RBC: 4.59 MIL/uL (ref 3.87–5.11)
RDW: 14.3 % (ref 11.5–15.5)
WBC: 8.1 10*3/uL (ref 4.0–10.5)
nRBC: 0 % (ref 0.0–0.2)

## 2022-03-14 LAB — TYPE AND SCREEN
ABO/RH(D): O POS
Antibody Screen: NEGATIVE

## 2022-03-14 MED ORDER — OXYTOCIN 10 UNIT/ML IJ SOLN
INTRAMUSCULAR | Status: AC
  Filled 2022-03-14: qty 2

## 2022-03-14 MED ORDER — DIBUCAINE (PERIANAL) 1 % EX OINT
1.0000 "application " | TOPICAL_OINTMENT | CUTANEOUS | Status: DC | PRN
  Administered 2022-03-14: 1 via RECTAL
  Filled 2022-03-14: qty 28

## 2022-03-14 MED ORDER — DIPHENHYDRAMINE HCL 25 MG PO CAPS: 25.0000 mg | ORAL_CAPSULE | Freq: Four times a day (QID) | ORAL | Status: DC | PRN

## 2022-03-14 MED ORDER — BENZOCAINE-MENTHOL 20-0.5 % EX AERO
1.0000 "application " | INHALATION_SPRAY | CUTANEOUS | Status: DC | PRN
  Administered 2022-03-14: 1 via TOPICAL
  Filled 2022-03-14: qty 56

## 2022-03-14 MED ORDER — BUTORPHANOL TARTRATE 1 MG/ML IJ SOLN
1.0000 mg | INTRAMUSCULAR | Status: DC | PRN
  Administered 2022-03-14: 1 mg via INTRAVENOUS
  Administered 2022-03-14: 0.5 mg via INTRAVENOUS
  Filled 2022-03-14 (×2): qty 1

## 2022-03-14 MED ORDER — OXYTOCIN-SODIUM CHLORIDE 30-0.9 UT/500ML-% IV SOLN
INTRAVENOUS | Status: AC
  Administered 2022-03-14: 333 mL via INTRAVENOUS
  Filled 2022-03-14: qty 500

## 2022-03-14 MED ORDER — DOCUSATE SODIUM 100 MG PO CAPS
100.0000 mg | ORAL_CAPSULE | Freq: Two times a day (BID) | ORAL | Status: DC
  Administered 2022-03-14 – 2022-03-16 (×3): 100 mg via ORAL
  Filled 2022-03-14 (×3): qty 1

## 2022-03-14 MED ORDER — AMMONIA AROMATIC IN INHA
RESPIRATORY_TRACT | Status: AC
  Filled 2022-03-14: qty 10

## 2022-03-14 MED ORDER — MISOPROSTOL 200 MCG PO TABS
ORAL_TABLET | ORAL | Status: AC
  Administered 2022-03-14: 25 ug via VAGINAL
  Filled 2022-03-14: qty 4

## 2022-03-14 MED ORDER — IBUPROFEN 600 MG PO TABS
ORAL_TABLET | ORAL | Status: AC
  Filled 2022-03-14: qty 1

## 2022-03-14 MED ORDER — ONDANSETRON HCL 4 MG PO TABS
4.0000 mg | ORAL_TABLET | ORAL | Status: DC | PRN
Start: 2022-03-14 — End: 2022-03-16

## 2022-03-14 MED ORDER — PRENATAL MULTIVITAMIN CH
1.0000 | ORAL_TABLET | Freq: Every day | ORAL | Status: DC
  Administered 2022-03-15: 1 via ORAL
  Filled 2022-03-14: qty 1

## 2022-03-14 MED ORDER — SIMETHICONE 80 MG PO CHEW
80.0000 mg | CHEWABLE_TABLET | ORAL | Status: DC | PRN
Start: 2022-03-14 — End: 2022-03-16
  Administered 2022-03-14: 80 mg via ORAL
  Filled 2022-03-14: qty 1

## 2022-03-14 MED ORDER — OXYTOCIN-SODIUM CHLORIDE 30-0.9 UT/500ML-% IV SOLN
2.5000 [IU]/h | INTRAVENOUS | Status: DC
  Administered 2022-03-14: 2.5 [IU]/h via INTRAVENOUS

## 2022-03-14 MED ORDER — IBUPROFEN 600 MG PO TABS
600.0000 mg | ORAL_TABLET | Freq: Four times a day (QID) | ORAL | Status: DC
  Administered 2022-03-14 – 2022-03-16 (×6): 600 mg via ORAL
  Filled 2022-03-14 (×5): qty 1

## 2022-03-14 MED ORDER — ONDANSETRON HCL 4 MG/2ML IJ SOLN: 4.0000 mg | Freq: Four times a day (QID) | INTRAMUSCULAR | Status: DC | PRN

## 2022-03-14 MED ORDER — OXYTOCIN BOLUS FROM INFUSION: 333.0000 mL | Freq: Once | INTRAVENOUS | Status: AC

## 2022-03-14 MED ORDER — LACTATED RINGERS IV SOLN: 500.0000 mL | INTRAVENOUS | Status: DC | PRN

## 2022-03-14 MED ORDER — ONDANSETRON HCL 4 MG/2ML IJ SOLN: 4.0000 mg | INTRAMUSCULAR | Status: DC | PRN

## 2022-03-14 MED ORDER — WITCH HAZEL-GLYCERIN EX PADS
1.0000 "application " | MEDICATED_PAD | CUTANEOUS | Status: DC | PRN
  Administered 2022-03-14: 1 via TOPICAL
  Filled 2022-03-14: qty 100

## 2022-03-14 MED ORDER — ACETAMINOPHEN 325 MG PO TABS: 650.0000 mg | ORAL_TABLET | ORAL | Status: DC | PRN

## 2022-03-14 MED ORDER — TERBUTALINE SULFATE 1 MG/ML IJ SOLN: 0.2500 mg | Freq: Once | INTRAMUSCULAR | Status: DC | PRN

## 2022-03-14 MED ORDER — LIDOCAINE HCL (PF) 1 % IJ SOLN
30.0000 mL | INTRAMUSCULAR | Status: DC | PRN
Start: 2022-03-14 — End: 2022-03-15
  Filled 2022-03-14: qty 30

## 2022-03-14 MED ORDER — LACTATED RINGERS IV SOLN
INTRAVENOUS | Status: DC
Start: 2022-03-14 — End: 2022-03-15

## 2022-03-14 MED ORDER — ACETAMINOPHEN 500 MG PO TABS
1000.0000 mg | ORAL_TABLET | Freq: Four times a day (QID) | ORAL | Status: DC
  Administered 2022-03-14 – 2022-03-15 (×4): 1000 mg via ORAL
  Filled 2022-03-14 (×4): qty 2

## 2022-03-14 MED ORDER — COCONUT OIL OIL: 1.0000 "application " | TOPICAL_OIL | Status: DC | PRN

## 2022-03-14 MED ORDER — LIDOCAINE HCL (PF) 1 % IJ SOLN
INTRAMUSCULAR | Status: DC
Start: 2022-03-14 — End: 2022-03-14
  Filled 2022-03-14: qty 30

## 2022-03-14 MED ORDER — ZOLPIDEM TARTRATE 5 MG PO TABS: 5.0000 mg | ORAL_TABLET | Freq: Every evening | ORAL | Status: DC | PRN

## 2022-03-14 MED ORDER — OXYTOCIN-SODIUM CHLORIDE 30-0.9 UT/500ML-% IV SOLN: 1.0000 m[IU]/min | INTRAVENOUS | Status: DC

## 2022-03-14 MED ORDER — MISOPROSTOL 25 MCG QUARTER TABLET
25.0000 ug | ORAL_TABLET | ORAL | Status: DC | PRN
  Administered 2022-03-14: 25 ug via VAGINAL
  Filled 2022-03-14 (×2): qty 1

## 2022-03-14 NOTE — H&P (Signed)
History and Physical Interval Note: ? ?03/14/2022 ?9:40 AM ? ?Sarah Gomez  has presented today for INDUCTION OF LABOR  with the diagnosis of Elective. The various methods of treatment have been discussed with the patient and family. After consideration of risks, benefits and other options for treatment, the patient has consented to  Labor induction .  The patient's history has been reviewed, patient examined, no change in status, and is stable for induction as planned.  See H&P. I have reviewed the patient's chart and labs.  Questions were answered to the patient's satisfaction.   ? ?Gen; A and O ?Lungs; CTAB ?Heart; RRR, no murmurs ?Abd; gravid EFW 7.5lbs ?Extremities; no edema ?SVE 1/50/-3 ? ?EFM: baseline 125, moderate variability, pos accel, neg decel ?TOCO; irregular  ? ?Plan ?Induction: cytotec placed at 0940, pitocin when able, AROM when appropriate ?GBS neg, membranes intact ?Fetus: category 1 tracing ?Pain management: desires IV pain medication ? ?Carie Caddy, CNM  ?Domingo Pulse, [facility]  ?@TODAY @  ?9:44 AM  ? ?

## 2022-03-14 NOTE — Discharge Summary (Signed)
Obstetrical Discharge Summary ? ?Date of Admission: 03/14/2022 ?Date of Discharge: 03/16/2022 ? ?Primary OB: [facility] ? ?Gestational Age at Delivery: [redacted]w[redacted]d  ? ?Antepartum complications: ADHD, hx seizure disorder  ?Reason for Admission: elective induction of labor  ?Date of Delivery: 03/14/2022  ?Delivered By: Siri Cole, CNM  ?Delivery Type: spontaneous vaginal delivery ?Intrapartum complications/course: None ?Anesthesia:  IV medication and Nitrous  ?Placenta: Delivered and expressed via active management. Intact: yes. To pathology: no.  ?Laceration: none ?Episiotomy: none ?EBL: ?Baby: Liveborn female, APGARs 8/9, weight 3230 g ?  ?Discharge Diagnosis: Delivered ? ?Postpartum course: patient had a routine postpartum course. She is tolerating regular diet, her pain is controlled with PO medication, she is ambulating and voiding without difficulty.  ? ?Discharge Vital Signs: ? Current Vital Signs 24h Vital Sign Ranges  ?T 97.7 ?F (36.5 ?C) Temp  Avg: 97.7 ?F (36.5 ?C)  Min: 97.6 ?F (36.4 ?C)  Max: 97.8 ?F (36.6 ?C)  ?BP 114/66 BP  Min: 101/65  Max: 114/66  ?HR 74 Pulse  Avg: 74.8  Min: 68  Max: 80  ?RR 20 Resp  Avg: 18.3  Min: 17  Max: 20  ?SaO2 100 % (Room Air) Room Air SpO2  Avg: 100 %  Min: 100 %  Max: 100 %  ?    ? 24 Hour I/O Current Shift I/O  ?Time ?Ins ?Outs 04/12 0701 - 04/13 0700 ?In: 546.7 [P.O.:480; I.V.:66.7] ?Out: -  No intake/output data recorded.  ? ? ? ?Patient Vitals for the past 6 hrs: ? BP Temp Temp src Pulse Resp SpO2  ?03/16/22 0821 114/66 97.7 ?F (36.5 ?C) Oral 74 20 100 %  ? ? ?Discharge Exam:  ?NAD ?Perineum: intact ?Abdomen: firm fundus below the umbilicus, NTTP, non distended, +bowel sounds.   ?RRR no MRGs ?CTAB ?Ext: no c/c/e ? ?Recent Labs  ?Lab 03/14/22 ?[id number] 03/15/22 ?8937  ?WBC 8.1 12.8*  ?HGB 11.0* 9.2*  ?HCT 35.6* 29.6*  ?PLT 268 237  ? ? ?Disposition: Home ? ?Rh Immune globulin given: no ?Rubella vaccine given: no ?Tdap vaccine given in AP or PP setting: yes ?Flu vaccine given in  AP or PP setting: no ? ?Contraception:  considering IUD ? ?Prenatal/Postnatal Panel: O POS//Rubella Immune//Varicella Not immune//RPR negative//HIV negative/HepB Surface Ag negative//pap no abnormalities (date: 2021)//plans to bottle feed ? ?Plan:  ?Sarah Gomez was discharged to home in good condition. ?Follow-up appointment with LMD in 2 and 6 weeks for a mood check and PP visit ? ?No future appointments. ? ?Discharge Medications: ?Allergies as of 03/16/2022   ?No Known Allergies ?  ? ?  ?Medication List  ?  ?You have not been prescribed any medications. ?  ? ? ?Parke Poisson, CNM ?[facility] ?[facility] ?03/16/2022, 9:10 AM ? ?

## 2022-03-14 NOTE — Progress Notes (Signed)
? ?  Subjective:  ?Feeling more intense contraction pain, especially in the back. Desires medication.  ? ?Objective:  ? ?Vitals: Blood pressure 114/65, pulse 76, temperature 97.9 ?F (36.6 ?C), temperature source Oral, resp. rate 16, height 5\' 5"  (1.651 m), weight 102.5 kg. ?General:  ?Abdomen: ?Cervical Exam:  ?Dilation: 4 ?Effacement (%): 50 ?Cervical Position: Posterior ?Station: -3 ?Presentation: Vertex ?Exam by:: L Jonette Wassel CNM ? ?FHT: baseline 130, moderate variability, neg accel, pos decel  ?Toco:q 2-3, mild to mod, soft resting tone  ? ?Results for orders placed or performed during the hospital encounter of 03/14/22 (from the past 24 hour(s))  ?CBC     Status: Abnormal  ? Collection Time: 03/14/22  9:38 AM  ?Result Value Ref Range  ? WBC 8.1 4.0 - 10.5 K/uL  ? RBC 4.59 3.87 - 5.11 MIL/uL  ? Hemoglobin 11.0 (L) 12.0 - 15.0 g/dL  ? HCT 35.6 (L) 36.0 - 46.0 %  ? MCV 77.6 (L) 80.0 - 100.0 fL  ? MCH 24.0 (L) 26.0 - 34.0 pg  ? MCHC 30.9 30.0 - 36.0 g/dL  ? RDW 14.3 11.5 - 15.5 %  ? Platelets 268 150 - 400 K/uL  ? nRBC 0.0 0.0 - 0.2 %  ?Type and screen     Status: None  ? Collection Time: 03/14/22  9:38 AM  ?Result Value Ref Range  ? ABO/RH(D) O POS   ? Antibody Screen NEG   ? Sample Expiration    ?  03/17/2022,2359 ?Performed at [facility], [address], [place], [place] [address] ?  ? ? ?Assessment:  ? 26 y.o. G2P1001 [redacted]w[redacted]d admitted for elective induction  ? ?Plan:  ? 1) Labor -2nd does of Cytotec placed around 1345 ?  ?2) Fetus - category 1 tracing ?  ?3) ID: GBS negative, membranes intact ?  ?4) Pain management: Stadol given  ?  ?5) hx of mood disorder, watch mood PP. ? ?Roberto Scales, CNM  ?Mosetta Pigeon, [place]  ?@TODAY @  ?5:17 PM  ? ?

## 2022-03-14 NOTE — Progress Notes (Signed)
? ?  Subjective:  ?Starting to feel contractions.  Partner at her side.  ? ?Objective:  ? ?Vitals: Blood pressure 114/65, pulse 76, temperature 97.9 ?F (36.6 ?C), temperature source Oral, resp. rate 16, height 5\' 5"  (1.651 m), weight 102.5 kg. ?General: NAD ?Abdomen:non tender ?Cervical Exam:  ?Dilation: 2 ?Effacement (%): 50 ?Cervical Position: Posterior ?Station: -3 ?Presentation: Vertex ?Exam by:: A [email or web address] RN ? ?FHT: baseline 125, moderate variability, pos accel, neg decel  ?Toco:q 2, soft resting tone  ? ?Results for orders placed or performed during the hospital encounter of 03/14/22 (from the past 24 hour(s))  ?CBC     Status: Abnormal  ? Collection Time: 03/14/22  9:38 AM  ?Result Value Ref Range  ? WBC 8.1 4.0 - 10.5 K/uL  ? RBC 4.59 3.87 - 5.11 MIL/uL  ? Hemoglobin 11.0 (L) 12.0 - 15.0 g/dL  ? HCT 35.6 (L) 36.0 - 46.0 %  ? MCV 77.6 (L) 80.0 - 100.0 fL  ? MCH 24.0 (L) 26.0 - 34.0 pg  ? MCHC 30.9 30.0 - 36.0 g/dL  ? RDW 14.3 11.5 - 15.5 %  ? Platelets 268 150 - 400 K/uL  ? nRBC 0.0 0.0 - 0.2 %  ?Type and screen     Status: None  ? Collection Time: 03/14/22  9:38 AM  ?Result Value Ref Range  ? ABO/RH(D) O POS   ? Antibody Screen NEG   ? Sample Expiration    ?  03/17/2022,2359 ?Performed at [facility], [address], [place], [place] ?  ? ? ?Assessment:  ? 26 y.o. G2P1001 [redacted]w[redacted]d admitted for elective induction of labor ? ?Plan:  ? ?1) Labor -2nd does of Cytotec placed around 1345 ? ?2) Fetus - category 1 tracing ? ?3) ID: GBS negative, membranes intact ? ?4) Pain management: prefers IV medication when desired.  ? ?5) hx of mood disorder, watch mood PP. ? ?[redacted]w[redacted]d, CNM  ?Carie Caddy, [facility]  ?@TODAY @  ?2:53 PM  ? ?  ? ?

## 2022-03-14 NOTE — Progress Notes (Signed)
Pt presents to labor and delivery for scheduled elective IOL. Orders released and pt admitted to unit. L.Dominic, CNM aware of pt's arrival.  ?

## 2022-03-15 ENCOUNTER — Encounter: Payer: Self-pay | Admitting: Oncology

## 2022-03-15 LAB — CBC
HCT: 29.6 % — ABNORMAL LOW (ref 36.0–46.0)
Hemoglobin: 9.2 g/dL — ABNORMAL LOW (ref 12.0–15.0)
MCH: 23.8 pg — ABNORMAL LOW (ref 26.0–34.0)
MCHC: 31.1 g/dL (ref 30.0–36.0)
MCV: 76.5 fL — ABNORMAL LOW (ref 80.0–100.0)
Platelets: 237 10*3/uL (ref 150–400)
RBC: 3.87 MIL/uL (ref 3.87–5.11)
RDW: 14.2 % (ref 11.5–15.5)
WBC: 12.8 10*3/uL — ABNORMAL HIGH (ref 4.0–10.5)
nRBC: 0 % (ref 0.0–0.2)

## 2022-03-15 LAB — RPR: RPR Ser Ql: NONREACTIVE

## 2022-03-15 MED ORDER — ACETAMINOPHEN 500 MG PO TABS
1000.0000 mg | ORAL_TABLET | Freq: Four times a day (QID) | ORAL | Status: DC | PRN
Start: 2022-03-15 — End: 2022-03-16

## 2022-03-15 NOTE — Progress Notes (Signed)
Post Partum Day 1 ?Subjective: ?no complaints, up ad lib, voiding, tolerating PO, and and she has opted to formula feed her baby. ? ?Objective: ?Blood pressure 100/64, pulse 72, temperature 98.6 ?F (37 ?C), temperature source Oral, resp. rate 16, height 5\' 5"  (1.651 m), weight 102.5 kg, SpO2 100 %, unknown if currently breastfeeding. ? ?Physical Exam:  ?General: cooperative, no distress, and moderately obese ?Lochia: appropriate ?Uterine Fundus: firm ?Incision: perineum is intact with minmal edema ?DVT Evaluation: No evidence of DVT seen on physical exam. ?Negative Homan's sign. ? ?Recent Labs  ?  03/14/22 ?[id number] 03/15/22 ?[id number]  ?HGB 11.0* 9.2*  ?HCT 35.6* 29.6*  ? ? ?Assessment/Plan: ?Plan for discharge tomorrow and Contraception considering Nexplanon Continue postpartum orders. ? ? LOS: 1 day  ? ?Imagene Riches ?03/15/2022, 9:41 AM  ? ? ?

## 2022-03-16 ENCOUNTER — Encounter: Payer: Self-pay | Admitting: Oncology

## 2022-03-16 DIAGNOSIS — Z3A39 39 weeks gestation of pregnancy: Secondary | ICD-10-CM

## 2022-03-16 NOTE — TOC Transition Note (Signed)
Transition of Care (TOC) - CM/SW Discharge Note ? ? ?Patient Details  ?Name: Sarah Gomez ?MRN: [id number] ?Date of Birth: 09/25/1996 ? ?Transition of Care (TOC) CM/SW Contact:  ?Alton Cellar, RN ?Phone Number: ?03/16/2022, 10:07 AM ? ? ?Clinical Narrative:    ?FOB to provide transportation home. Car seat in place and no other needs.  ? ? ?  ?  ? ? ?Patient Goals and CMS Choice ?  ?  ?  ? ?Discharge Placement ?  ?           ?  ?  ?  ?  ? ?Discharge Plan and Services ?  ?  ?           ?  ?  ?  ?  ?  ?  ?  ?  ?  ?  ? ?Social Determinants of Health (SDOH) Interventions ?  ? ? ?Readmission Risk Interventions ?   ? View : No data to display.  ?  ?  ?  ? ? ? ? ? ?

## 2022-03-16 NOTE — Progress Notes (Signed)
Mother discharged.  Discharge instructions given.  Mother verbalizes understanding.  Transported by auxiliary.  

## 2022-03-16 NOTE — Discharge Instructions (Signed)
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
## 2022-03-17 ENCOUNTER — Encounter: Payer: Self-pay | Admitting: Oncology

## 2022-03-30 ENCOUNTER — Encounter: Payer: Self-pay | Admitting: Licensed Practical Nurse

## 2022-03-30 ENCOUNTER — Ambulatory Visit (INDEPENDENT_AMBULATORY_CARE_PROVIDER_SITE_OTHER): Payer: 59 | Admitting: Licensed Practical Nurse

## 2022-03-30 DIAGNOSIS — Z1332 Encounter for screening for maternal depression: Secondary | ICD-10-CM

## 2022-03-30 NOTE — Progress Notes (Signed)
?Postpartum Visit  ?Chief Complaint:  ?Chief Complaint  ?Patient presents with  ? Postpartum Care  ? ? ?History of Present Illness: Patient is a 26 y.o. [id number] presents for postpartum visit. ? ?Date of delivery: 03/14/22 ?Type of delivery: Vaginal delivery - Vacuum or forceps assisted  no ?Episiotomy No.  ?Laceration: no  ?Pregnancy or labor problems:  no, it was quick!  ?Any problems since the delivery:  no ?Bleeding is light, red in color ?No Concerns with her perineum, voiding or having BM ?Thinks her family is complete, but not absolutely sure, desires Nexplanon ?Reviewed events of birth, is happy that "it is over" ?Mood: has been better than after her last birth, denies any concerns today.  ? ? ?Newborn Details:  ?SINGLETON :  ?1. Baby's name: girl . Birth weight: 3230 ?Maternal Details:  ?Breast Feeding:  no ?Post partum depression/anxiety noted:  no, Well dressed, in good spirits today, denies any concerns for depression.  ?Edinburgh Post-Partum Depression Score:  9  ?Date of last PAP: 02/2020  normal  ? ?Past Medical History:  ?Diagnosis Date  ? ADHD   ? Anxiety   ? Depression   ? Epilepsy (HCC)   ? Iron deficiency anemia 12/02/2019  ? Migraine   ? Mood disorder (HCC)   ? Seizures (HCC)   ? ? ?Past Surgical History:  ?Procedure Laterality Date  ? NO PAST SURGERIES    ? ? ?Prior to Admission medications   ?Medication Sig Start Date End Date Taking? Authorizing Provider  ?norgestimate-ethinyl estradiol (ORTHO-CYCLEN) 0.25-35 MG-MCG tablet Take 1 tablet by mouth daily. 02/03/20 08/29/20  Tresea Mall, CNM  ?promethazine (PHENERGAN) 12.5 MG tablet Take 1 tablet (12.5 mg total) by mouth every 6 (six) hours as needed for nausea or vomiting. 06/13/19 07/30/19  Jene Every, MD  ?sertraline (ZOLOFT) 25 MG tablet Take 1 tablet (25 mg total) by mouth daily. 02/03/20 08/29/20  Tresea Mall, CNM  ? ? ?No Known Allergies  ? ?Social History  ? ?Socioeconomic History  ? Marital status: Single  ?  Spouse name: Not on file  ?  Number of children: 1  ? Years of education: Not on file  ? Highest education level: 12th grade  ?Occupational History  ? Occupation: factory   ?Tobacco Use  ? Smoking status: Former  ? Smokeless tobacco: Never  ?Vaping Use  ? Vaping Use: Former  ?Substance and Sexual Activity  ? Alcohol use: Not Currently  ? Drug use: Not Currently  ?  Types: Marijuana  ? Sexual activity: Not Currently  ?  Partners: Male  ?  Birth control/protection: Implant  ?Other Topics Concern  ? Not on file  ?Social History Narrative  ? Right handed   ? ?Social Determinants of Health  ? ?Financial Resource Strain: Not on file  ?Food Insecurity: Not on file  ?Transportation Needs: Not on file  ?Physical Activity: Not on file  ?Stress: Not on file  ?Social Connections: Not on file  ?Intimate Partner Violence: Not on file  ? ? ?Family History  ?Problem Relation Age of Onset  ? Multiple sclerosis Mother   ? Hypertension Father   ? Heart Problems Sister   ? ? ?ROS  ? ?Physical Exam ?BP 122/70   Ht 5\' 5"  (1.651 m)   Wt 204 lb (92.5 kg)   Breastfeeding No   BMI 33.95 kg/m?   ?Physical Exam ?Constitutional:   ?   Appearance: Normal appearance.  ?Genitourinary:  ?   Genitourinary Comments: Declined exam   ?  Cardiovascular:  ?   Rate and Rhythm: Normal rate and regular rhythm.  ?   Pulses: Normal pulses.  ?   Heart sounds: Normal heart sounds.  ?Pulmonary:  ?   Effort: Pulmonary effort is normal.  ?   Breath sounds: Normal breath sounds.  ?Chest:  ?   Comments: Breasts: soft, no masses or redness, nipples intact bilaterally  ?Abdominal:  ?   General: Abdomen is flat.  ?   Tenderness: There is no abdominal tenderness.  ?Musculoskeletal:     ?   General: Normal range of motion.  ?   Cervical back: Normal range of motion and neck supple.  ?Neurological:  ?   General: No focal deficit present.  ?   Mental Status: She is alert.  ?Skin: ?   General: Skin is warm.  ?Psychiatric:     ?   Mood and Affect: Mood normal.  ?  ? ? ?Assessment: 26 y.o. K2H0623  presenting for 2 week postpartum visit ? ?Plan: ?Problem List Items Addressed This Visit   ?None ? ? ? ?1) Contraception Education given regarding options for contraception, including injectable contraception. ? ?2)  Pap ?- ASCCP guidelines and rational discussed.  Patient opts for 3 screening interval due 02/2023  ? ?3) Patient underwent screening for postpartum depression with Some concerns noted. Has a history of depression and anxiety, is well aware of her symptoms, she knows when to reach out.  ? ?4) Follow up 1 year for routine annual exam ? ?Carie Caddy, CNM  ?Domingo Pulse, [facility]  ?03/31/22  ?4:07 PM  ? ?

## 2022-04-03 HISTORY — PX: CHOLECYSTECTOMY: SHX55

## 2022-04-24 ENCOUNTER — Ambulatory Visit (INDEPENDENT_AMBULATORY_CARE_PROVIDER_SITE_OTHER): Payer: 59 | Admitting: Advanced Practice Midwife

## 2022-04-24 ENCOUNTER — Encounter: Payer: Self-pay | Admitting: Advanced Practice Midwife

## 2022-04-24 DIAGNOSIS — Z30017 Encounter for initial prescription of implantable subdermal contraceptive: Secondary | ICD-10-CM

## 2022-04-24 MED ORDER — ETONOGESTREL 68 MG ~~LOC~~ IMPL
68.0000 mg | DRUG_IMPLANT | Freq: Once | SUBCUTANEOUS | Status: AC
  Administered 2022-04-24: 68 mg via SUBCUTANEOUS

## 2022-04-25 ENCOUNTER — Ambulatory Visit: Payer: 59 | Admitting: Licensed Practical Nurse

## 2022-04-26 NOTE — Progress Notes (Signed)
Postpartum Visit  Chief Complaint:  Chief Complaint  Patient presents with   Postpartum Care    History of Present Illness: Patient is a 26 y.o. [id number] presents for postpartum visit.  Review the Delivery Report for details.   Date of delivery: 03/14/2022 Type of delivery: Vaginal delivery - Vacuum or forceps assisted  no Episiotomy No.  Laceration: no  Pregnancy or labor problems:  no Any problems since the delivery:  she mentions sub sternal pain that she thinks may be heartburn.   Newborn Details:  SINGLETON :  1. BabyGender female. Birth weight: 3230 g Maternal Details:  Breast or formula feeding: formula feeding Intercourse: No  Contraception after delivery:  Nexplanon Any bowel or bladder issues: No  Post partum depression/anxiety noted:  mild Edinburgh Post-Partum Depression Score: 8 Date of last PAP: 02/03/20  no abnormalities   Review of Systems: Review of Systems  Constitutional:  Negative for chills and fever.  HENT:  Negative for congestion, ear discharge, ear pain, hearing loss, sinus pain and sore throat.   Eyes:  Negative for blurred vision and double vision.  Respiratory:  Negative for cough, shortness of breath and wheezing.   Cardiovascular:  Positive for chest pain. Negative for palpitations and leg swelling.  Gastrointestinal:  Positive for heartburn. Negative for abdominal pain, blood in stool, constipation, diarrhea, melena, nausea and vomiting.  Genitourinary:  Negative for dysuria, flank pain, frequency, hematuria and urgency.  Musculoskeletal:  Negative for back pain, joint pain and myalgias.  Skin:  Negative for itching and rash.  Neurological:  Negative for dizziness, tingling, tremors, sensory change, speech change, focal weakness, seizures, loss of consciousness, weakness and headaches.  Endo/Heme/Allergies:  Negative for environmental allergies. Does not bruise/bleed easily.  Psychiatric/Behavioral:  Negative for depression, hallucinations,  memory loss, substance abuse and suicidal ideas. The patient is not nervous/anxious and does not have insomnia.      Past Medical History:  Past Medical History:  Diagnosis Date   ADHD    Anxiety    Depression    Epilepsy (HCC)    Iron deficiency anemia 12/02/2019   Migraine    Mood disorder (HCC)    Seizures (HCC)     Past Surgical History:  Past Surgical History:  Procedure Laterality Date   NO PAST SURGERIES      Family History:  Family History  Problem Relation Age of Onset   Multiple sclerosis Mother    Hypertension Father    Heart Problems Sister     Social History:  Social History   Socioeconomic History   Marital status: Single    Spouse name: Not on file   Number of children: 1   Years of education: Not on file   Highest education level: 12th grade  Occupational History   Occupation: factory   Tobacco Use   Smoking status: Former   Smokeless tobacco: Never  Building services engineer Use: Former  Substance and Sexual Activity   Alcohol use: Not Currently   Drug use: Not Currently    Types: Marijuana   Sexual activity: Not Currently    Partners: Male    Birth control/protection: Implant  Other Topics Concern   Not on file  Social History Narrative   Right handed    Social Determinants of Health   Financial Resource Strain: Not on file  Food Insecurity: Not on file  Transportation Needs: Not on file  Physical Activity: Not on file  Stress: Not on file  Social  Connections: Not on file  Intimate Partner Violence: Not on file    Allergies:  No Known Allergies  Medications: Prior to Admission medications   Medication Sig Start Date End Date Taking? Authorizing Provider  norgestimate-ethinyl estradiol (ORTHO-CYCLEN) 0.25-35 MG-MCG tablet Take 1 tablet by mouth daily. 02/03/20 08/29/20  Tresea Mall, CNM  promethazine (PHENERGAN) 12.5 MG tablet Take 1 tablet (12.5 mg total) by mouth every 6 (six) hours as needed for nausea or vomiting. 06/13/19  07/30/19  Jene Every, MD  sertraline (ZOLOFT) 25 MG tablet Take 1 tablet (25 mg total) by mouth daily. 02/03/20 08/29/20  Tresea Mall, CNM    Physical Exam Height 5\' 5"  (1.651 m), weight 204 lb (92.5 kg), last menstrual period 04/15/2022    General: NAD HEENT: normocephalic, anicteric Pulmonary: No increased work of breathing Abdomen: NABS, soft, non-tender, non-distended.  Umbilicus without lesions.  No hepatomegaly, splenomegaly or masses palpable. No evidence of hernia. Genitourinary:  External: Normal external female genitalia.  Normal urethral meatus, normal Bartholin's and Skene's glands.    Vagina: Normal vaginal mucosa, no evidence of prolapse.    Cervix: Grossly normal in appearance, no bleeding, no CMT  Uterus: Non-enlarged, mobile, normal contour.    Adnexa: ovaries non-enlarged, no adnexal masses  Rectal: deferred Extremities: no edema, erythema, or tenderness Neurologic: Grossly intact Psychiatric: mood appropriate, affect full   Edinburgh Postnatal Depression Scale - 04/24/22 1508       Edinburgh Postnatal Depression Scale:  In the Past 7 Days   I have been able to laugh and see the funny side of things. 0    I have looked forward with enjoyment to things. 1    I have blamed myself unnecessarily when things went wrong. 2    I have been anxious or worried for no good reason. 1    I have felt scared or panicky for no good reason. 0    Things have been getting on top of me. 1    I have been so unhappy that I have had difficulty sleeping. 1    I have felt sad or miserable. 1    I have been so unhappy that I have been crying. 1    The thought of harming myself has occurred to me. 0    Edinburgh Postnatal Depression Scale Total 8             Assessment: 26 y.o. 22 presenting for 6 week postpartum visit  Plan: Problem List Items Addressed This Visit   None Visit Diagnoses     6 weeks postpartum follow-up    -  Primary   Relevant Medications    etonogestrel (NEXPLANON) implant 68 mg (Completed)   Nexplanon insertion       Relevant Medications   etonogestrel (NEXPLANON) implant 68 mg (Completed)        1) Contraception - Education given regarding options for contraception, as well as compatibility with breast feeding if applicable.  Patient plans on Nexplanon for contraception.  2)  Pap - ASCCP guidelines and rationale discussed.   Patient opts for every 3 years screening interval  3) Patient underwent screening for postpartum depression with  no concerns for depression noted  4) Return in about 1 year (around 04/25/2023) for annual established gyn.   04/27/2023, CNM [facility] 04/26/2022, 5:49 PM      GYNECOLOGY PROCEDURE NOTE  Patient is a 26 y.o. 22 presenting for Nexplanon insertion as her desires means of contraception.  She provided informed consent, signed copy in the chart, time out was performed. Self reported LMP of Patient's last menstrual period was 04/15/2022.  She understands that Nexplanon is a progesterone only therapy, and that patients often have irregular and unpredictable vaginal bleeding or amenorrhea. She understands that other side effects are possible related to systemic progesterone, including but not limited to, headaches, breast tenderness, nausea, and irritability. While effective at preventing pregnancy long acting reversible contraceptives do not prevent transmission of sexually transmitted diseases and use of barrier methods for this purpose was discussed. The placement procedure for Nexplanon was reviewed with the patient in detail including risks of nerve injury, infection, bleeding and injury to other muscles or tendons. She understands that the Nexplanon implant is good for 3 years and needs to be removed at the end of that time.  She understands that Nexplanon is an extremely effective option for contraception, with failure rate of <1%. This information  is reviewed today and all questions were answered. Informed consent was obtained, both verbally and written.   The patient is healthy and has no contraindications to Nexplanon use.   Procedure Appropriate time out taken.  Patient placed in dorsal supine with left arm above head, elbow flexed at 90 degrees, arm resting on examination table.  The bicipital groove was palpated and site 8-10cm proximal to the medial epicondyle was indentified . The insertion site was prepped with  three betadine swabs and then injected with 2.5 ml of 1% lidocaine without epinephrine.  Nexplanon removed form sterile blister packaging,  Device confirmed in needle, before inserting full length of needle, tenting up the skin as the needle was advanced.  The drug eluding rod was then deployed by pulling back the slider per the manufactures recommendation.  The implant was palpable by the clinician as well as the patient.  The insertion site was dressed with a steri strip and band aid before applying  a kerlex bandage pressure dressing..Minimal blood loss was noted during the procedure.  The patient tolerated the procedure well.   She was instructed to wear the bandage for 24 hours, call with any signs of infection.  She was given the Nexplanon card and instructed to have the rod removed in 3 years.   Parke PoissonJane E. Jocelynne Duquette, CNM [facility] 04/26/22, 5:59 PM    Charge [phone] for nexplanon device, CPT [phone] for procedure J2001 for lidocaine administration Modifer 25, plus Modifer 79 is done during a global billing visit

## 2022-04-26 NOTE — Patient Instructions (Signed)
Etonogestrel Implant What is this medication? ETONOGESTREL (et oh noe JES trel) prevents ovulation and pregnancy. It belongs to a group of medications called contraceptives. This medication is a progestin hormone. This medicine may be used for other purposes; ask your health care provider or pharmacist if you have questions. COMMON BRAND NAME(S): Implanon, Nexplanon What should I tell my care team before I take this medication? They need to know if you have any of these conditions: Abnormal vaginal bleeding Blood vessel disease or blood clots Breast, cervical, endometrial, ovarian, liver, or uterine cancer Diabetes Gallbladder disease Heart disease or recent heart attack High blood pressure High cholesterol or triglycerides Kidney disease Liver disease Migraine headaches Seizures Stroke Tobacco smoker An unusual or allergic reaction to etonogestrel, anesthetics or antiseptics, other medications, foods, dyes, or preservatives Pregnant or trying to get pregnant Breast-feeding How should I use this medication? This device is inserted just under the skin on the inner side of your upper arm by your care team. Talk to your care team about the use of this medication in children. Special care may be needed. Overdosage: If you think you have taken too much of this medicine contact a poison control center or emergency room at once. NOTE: This medicine is only for you. Do not share this medicine with others. What if I miss a dose? This does not apply. What may interact with this medication? Do not take this medication with any of the following: Amprenavir Fosamprenavir This medication may also interact with the following: Acitretin Aprepitant Armodafinil Bexarotene Bosentan Carbamazepine Certain medications for fungal infections like fluconazole, ketoconazole, itraconazole and voriconazole Certain medications to treat hepatitis, HIV or  AIDS Cyclosporine Felbamate Griseofulvin Lamotrigine Modafinil Oxcarbazepine Phenobarbital Phenytoin Primidone Rifabutin Rifampin Rifapentine St. John's wort Topiramate This list may not describe all possible interactions. Give your health care provider a list of all the medicines, herbs, non-prescription drugs, or dietary supplements you use. Also tell them if you smoke, drink alcohol, or use illegal drugs. Some items may interact with your medicine. What should I watch for while using this medication? This product does not protect you against HIV infection (AIDS) or other sexually transmitted diseases. You should be able to feel the implant by pressing your fingertips over the skin where it was inserted. Contact your care team if you cannot feel the implant, and use a non-hormonal birth control method (such as condoms) until your care team confirms that the implant is in place. Contact your care team if you think that the implant may have broken or become bent while in your arm. You will receive a user card from your care team after the implant is inserted. The card is a record of the location of the implant in your upper arm and when it should be removed. Keep this card with your health records. What side effects may I notice from receiving this medication? Side effects that you should report to your care team as soon as possible: Allergic reactions--skin rash, itching, hives, swelling of the face, lips, tongue, or throat Blood clot--pain, swelling, or warmth in the leg, shortness of breath, chest pain Gallbladder problems--severe stomach pain, nausea, vomiting, fever Increase in blood pressure Liver injury--right upper belly pain, loss of appetite, nausea, light-colored stool, dark yellow or brown urine, yellowing skin or eyes, unusual weakness or fatigue New or worsening migraines or headaches Pain, redness, or irritation at injection site Stroke--sudden numbness or weakness of the  face, arm, or leg, trouble speaking, confusion, trouble walking, loss   of balance or coordination, dizziness, severe headache, change in vision Unusual vaginal discharge, itching, or odor Worsening mood, feelings of depression Side effects that usually do not require medical attention (report to your care team if they continue or are bothersome): Breast pain or tenderness Dark patches of skin on the face or other sun-exposed areas Irregular menstrual cycles or spotting Nausea Weight gain This list may not describe all possible side effects. Call your doctor for medical advice about side effects. You may report side effects to FDA at 1-800-FDA-1088. Where should I keep my medication? This medication is given in a hospital or clinic and will not be stored at home. NOTE: This sheet is a summary. It may not cover all possible information. If you have questions about this medicine, talk to your doctor, pharmacist, or health care provider.  2023 Elsevier/Gold Standard (2021-10-21 00:00:00)  

## 2022-05-03 ENCOUNTER — Ambulatory Visit: Payer: 59 | Admitting: Licensed Practical Nurse

## 2022-05-22 ENCOUNTER — Ambulatory Visit: Payer: Self-pay | Admitting: Surgery

## 2022-05-22 ENCOUNTER — Other Ambulatory Visit: Payer: Self-pay

## 2022-05-22 ENCOUNTER — Emergency Department: Payer: 59

## 2022-05-22 ENCOUNTER — Observation Stay
Admission: EM | Admit: 2022-05-22 | Discharge: 2022-05-23 | Disposition: A | Discharge status: Home / Self Care | Payer: 59 | Attending: Surgery | Admitting: Surgery

## 2022-05-22 DIAGNOSIS — Z87891 Personal history of nicotine dependence: Secondary | ICD-10-CM | POA: Insufficient documentation

## 2022-05-22 DIAGNOSIS — K811 Chronic cholecystitis: Secondary | ICD-10-CM | POA: Diagnosis present

## 2022-05-22 DIAGNOSIS — K8010 Calculus of gallbladder with chronic cholecystitis without obstruction: Principal | ICD-10-CM | POA: Insufficient documentation

## 2022-05-22 DIAGNOSIS — K8050 Calculus of bile duct without cholangitis or cholecystitis without obstruction: Principal | ICD-10-CM

## 2022-05-22 DIAGNOSIS — R109 Unspecified abdominal pain: Secondary | ICD-10-CM | POA: Diagnosis present

## 2022-05-22 LAB — URINALYSIS, ROUTINE W REFLEX MICROSCOPIC
Bacteria, UA: NONE SEEN
Bilirubin Urine: NEGATIVE
Glucose, UA: NEGATIVE mg/dL
Hgb urine dipstick: NEGATIVE
Ketones, ur: NEGATIVE mg/dL
Nitrite: NEGATIVE
Protein, ur: NEGATIVE mg/dL
Specific Gravity, Urine: 1.024 (ref 1.005–1.030)
pH: 6 (ref 5.0–8.0)

## 2022-05-22 LAB — CBC
HCT: 40.6 % (ref 36.0–46.0)
Hemoglobin: 12.5 g/dL (ref 12.0–15.0)
MCH: 23.9 pg — ABNORMAL LOW (ref 26.0–34.0)
MCHC: 30.8 g/dL (ref 30.0–36.0)
MCV: 77.6 fL — ABNORMAL LOW (ref 80.0–100.0)
Platelets: 258 10*3/uL (ref 150–400)
RBC: 5.23 MIL/uL — ABNORMAL HIGH (ref 3.87–5.11)
RDW: 15.6 % — ABNORMAL HIGH (ref 11.5–15.5)
WBC: 10.1 10*3/uL (ref 4.0–10.5)
nRBC: 0 % (ref 0.0–0.2)

## 2022-05-22 LAB — COMPREHENSIVE METABOLIC PANEL
ALT: 25 U/L (ref 0–44)
AST: 47 U/L — ABNORMAL HIGH (ref 15–41)
Albumin: 3.9 g/dL (ref 3.5–5.0)
Alkaline Phosphatase: 76 U/L (ref 38–126)
Anion gap: 7 (ref 5–15)
BUN: 18 mg/dL (ref 6–20)
CO2: 24 mmol/L (ref 22–32)
Calcium: 9 mg/dL (ref 8.9–10.3)
Chloride: 108 mmol/L (ref 98–111)
Creatinine, Ser: 0.9 mg/dL (ref 0.44–1.00)
GFR, Estimated: >60 mL/min (ref >60)
Glucose, Bld: 99 mg/dL (ref 70–99)
Potassium: 3.7 mmol/L (ref 3.5–5.1)
Sodium: 139 mmol/L (ref 135–145)
Total Bilirubin: 0.5 mg/dL (ref 0.3–1.2)
Total Protein: 6.7 g/dL (ref 6.5–8.1)

## 2022-05-22 LAB — POC URINE PREG, ED: Preg Test, Ur: NEGATIVE

## 2022-05-22 LAB — LIPASE, BLOOD: Lipase: 33 U/L (ref 11–51)

## 2022-05-22 MED ORDER — ONDANSETRON HCL 4 MG/2ML IJ SOLN: 4.0000 mg | Freq: Four times a day (QID) | INTRAMUSCULAR | Status: DC | PRN

## 2022-05-22 MED ORDER — TRAMADOL HCL 50 MG PO TABS: 50.0000 mg | ORAL_TABLET | Freq: Four times a day (QID) | ORAL | Status: DC | PRN

## 2022-05-22 MED ORDER — MORPHINE SULFATE (PF) 2 MG/ML IV SOLN: 2.0000 mg | INTRAVENOUS | Status: DC | PRN

## 2022-05-22 MED ORDER — CHLORHEXIDINE GLUCONATE CLOTH 2 % EX PADS
6.0000 | MEDICATED_PAD | Freq: Once | CUTANEOUS | Status: AC
  Administered 2022-05-23: 6 via TOPICAL

## 2022-05-22 MED ORDER — SODIUM CHLORIDE 0.9 % IV SOLN
2.0000 g | INTRAVENOUS | Status: DC
  Administered 2022-05-22: 2 g via INTRAVENOUS
  Filled 2022-05-22 (×2): qty 20

## 2022-05-22 MED ORDER — ONDANSETRON 4 MG PO TBDP: 4.0000 mg | ORAL_TABLET | Freq: Four times a day (QID) | ORAL | Status: DC | PRN

## 2022-05-22 MED ORDER — CELECOXIB 200 MG PO CAPS
200.0000 mg | ORAL_CAPSULE | ORAL | Status: AC
  Filled 2022-05-22: qty 1

## 2022-05-22 MED ORDER — ALUM & MAG HYDROXIDE-SIMETH 200-200-20 MG/5ML PO SUSP
30.0000 mL | Freq: Once | ORAL | Status: DC
  Filled 2022-05-22: qty 30

## 2022-05-22 MED ORDER — CEFAZOLIN SODIUM-DEXTROSE 2-4 GM/100ML-% IV SOLN
2.0000 g | INTRAVENOUS | Status: AC
  Administered 2022-05-23: 2 g via INTRAVENOUS
  Filled 2022-05-22: qty 100

## 2022-05-22 MED ORDER — ACETAMINOPHEN 500 MG PO TABS
1000.0000 mg | ORAL_TABLET | Freq: Once | ORAL | Status: AC
Start: 2022-05-22 — End: 2022-05-22
  Administered 2022-05-22: 1000 mg via ORAL
  Filled 2022-05-22: qty 2

## 2022-05-22 MED ORDER — LIDOCAINE VISCOUS HCL 2 % MT SOLN
15.0000 mL | Freq: Once | OROMUCOSAL | Status: DC
  Filled 2022-05-22: qty 15

## 2022-05-22 MED ORDER — HYDROCODONE-ACETAMINOPHEN 5-325 MG PO TABS
1.0000 | ORAL_TABLET | ORAL | Status: DC | PRN
  Administered 2022-05-23: 1 via ORAL
  Filled 2022-05-22: qty 1

## 2022-05-22 MED ORDER — ACETAMINOPHEN 500 MG PO TABS: 1000.0000 mg | ORAL_TABLET | ORAL | Status: AC

## 2022-05-22 MED ORDER — INDOCYANINE GREEN 25 MG IV SOLR
1.2500 mg | Freq: Once | INTRAVENOUS | Status: AC
  Administered 2022-05-23: 1.25 mg via INTRAVENOUS
  Filled 2022-05-22: qty 0.5

## 2022-05-22 MED ORDER — GABAPENTIN 300 MG PO CAPS: 300.0000 mg | ORAL_CAPSULE | ORAL | Status: AC

## 2022-05-22 MED ORDER — OXYCODONE HCL 5 MG PO TABS
5.0000 mg | ORAL_TABLET | Freq: Once | ORAL | Status: AC
  Administered 2022-05-22: 5 mg via ORAL
  Filled 2022-05-22: qty 1

## 2022-05-22 MED ORDER — DOCUSATE SODIUM 100 MG PO CAPS: 100.0000 mg | ORAL_CAPSULE | Freq: Two times a day (BID) | ORAL | Status: DC | PRN

## 2022-05-22 NOTE — ED Notes (Signed)
Surgeon in with pt.

## 2022-05-22 NOTE — ED Notes (Signed)
Spoke with Dr. Dolores Frame regarding pt presentation, additional orders placed by EDP

## 2022-05-22 NOTE — H&P (Signed)
Subjective:  CC: biliary colic  HPI:  Sarah Gomez is a 26 y.o. female who is consulted by [facility] for evaluation of above cc.  Symptoms were first noted 1 days ago. Pain is subxyphoid, woken from sleep, radiating to back.  Initial episode 2 years ago, weekly recently.  This most recent episode worst.  Associated with nothing, exacerbated by nothing     Past Medical History:  has a past medical history of ADHD, Anxiety, Depression, Epilepsy (HCC), Iron deficiency anemia (12/02/2019), Migraine, Mood disorder (HCC), and Seizures (HCC).  Past Surgical History:  has a past surgical history that includes No past surgeries.  Family History: family history includes Heart Problems in her sister; Hypertension in her father; Multiple sclerosis in her mother.  Social History:  reports that she has quit smoking. She has never used smokeless tobacco. She reports that she does not currently use alcohol. She reports that she does not currently use drugs after having used the following drugs: Marijuana.  Current Medications:  Prior to Admission medications  Medication Sig Start Date End Date Taking? Authorizing Provider norgestimate-ethinyl estradiol (ORTHO-CYCLEN) 0.25-35 MG-MCG tablet Take 1 tablet by mouth daily. 02/03/20 08/29/20  Gledhill, Jane, CNM promethazine (PHENERGAN) 12.5 MG tablet Take 1 tablet (12.5 mg total) by mouth every 6 (six) hours as needed for nausea or vomiting. 06/13/19 07/30/19  Kinner, Robert, MD sertraline (ZOLOFT) 25 MG tablet Take 1 tablet (25 mg total) by mouth daily. 02/03/20 08/29/20  Gledhill, Jane, CNM   Allergies:  Allergies as of 05/22/2022  (No Known Allergies)   ROS:  General: Denies weight loss, weight gain, fatigue, fevers, chills, and night sweats. Eyes: Denies blurry vision, double vision, eye pain, itchy eyes, and tearing. Ears: Denies hearing loss, earache, and ringing in ears. Nose: Denies sinus pain, congestion, infections, runny nose, and  nosebleeds. Mouth/throat: Denies hoarseness, sore throat, bleeding gums, and difficulty swallowing. Heart: Denies chest pain, palpitations, racing heart, irregular heartbeat, leg pain or swelling, and decreased activity tolerance. Respiratory: Denies breathing difficulty, shortness of breath, wheezing, cough, and sputum. GI: Denies change in appetite, heartburn, nausea, vomiting, constipation, diarrhea, and blood in stool. GU: Denies difficulty urinating, pain with urinating, urgency, frequency, blood in urine. Musculoskeletal: Denies joint stiffness, pain, swelling, muscle weakness. Skin: Denies rash, itching, mass, tumors, sores, and boils Neurologic: Denies headache, fainting, dizziness, seizures, numbness, and tingling. Psychiatric: Denies depression, anxiety, difficulty sleeping, and memory loss. Endocrine: Denies heat or cold intolerance, and increased thirst or urination. Blood/lymph: Denies easy bruising, and swollen glands     Objective:    BP 120/88 (BP Location: Left Arm)   Pulse 80   Temp 97.6 F (36.4 C) (Oral)   Resp 16   Ht 5' 5" (1.651 m)   Wt 92.5 kg   LMP 04/15/2022   SpO2 99%   BMI 33.95 kg/m    Constitutional :  alert, cooperative, and appears stated age Lymphatics/Throat:  no asymmetry, masses, or scars Respiratory:  clear to auscultation bilaterally Cardiovascular:  regular rate and rhythm Gastrointestinal: Soft, no guarding, mild TTP epigastric region .  Musculoskeletal: Steady movement Skin: Cool and moist Psychiatric: Normal affect, non-agitated, not confused     LABS:     Latest Ref Rng & Units 05/22/2022    4:34 AM 12/14/2021    2:23 PM 10/10/2021    5:01 PM CMP Glucose 70 - 99 mg/dL 99  84  94  BUN 6 - 20 mg/dL 18  6  6  Creatinine 0.44 - 1.00   mg/dL 4.00  8.67  6.19  Sodium 135 - 145 mmol/L 139  134   136  Potassium 3.5 - 5.1 mmol/L 3.7  3.6  3.8  Chloride 98 - 111 mmol/L 108  102  104  CO2 22 - 32 mmol/L 24  24  25   Calcium 8.9 - 10.3  mg/dL 9.0  8.1   9.1  Total Protein 6.5 - 8.1 g/dL 6.7  5.7   6.3   Total Bilirubin 0.3 - 1.2 mg/dL 0.5  0.5  0.5  Alkaline Phos 38 - 126 U/L 76  55  41  AST 15 - 41 U/L 47   17  23  ALT 0 - 44 U/L 25  12  18       Latest Ref Rng & Units 05/22/2022    4:34 AM 03/15/2022    5:55 AM 03/14/2022    9:38 AM CBC WBC 4.0 - 10.5 K/uL 10.1  12.8   8.1  Hemoglobin 12.0 - 15.0 g/dL 05/15/2022  9.2   05/14/2022   Hematocrit 36.0 - 46.0 % 40.6  29.6   35.6   Platelets 150 - 400 K/uL 258  237  268     RADS: CLINICAL DATA:  2 month history of epigastric pain.   EXAM: ULTRASOUND ABDOMEN LIMITED RIGHT UPPER QUADRANT   COMPARISON:  None Available.   FINDINGS: Gallbladder:   Multiple gallstones evident measuring up to 2.6 cm. No gallbladder wall thickening or pericholecystic fluid. Sonographer reports no sonographic Murphy sign.   Common bile duct:   Diameter: 4 mm   Liver:   No focal lesion identified. Within normal limits in parenchymal echogenicity. Portal vein is patent on color Doppler imaging with normal direction of blood flow towards the liver.   Other: None.   IMPRESSION: Cholelithiasis without gallbladder wall thickening or pericholecystic fluid. No biliary dilatation.     Electronically Signed   By: 50.9 M.D.   On: 05/22/2022 07:04 Assessment:     Biliary colic  Plan:     Discussed the risk of surgery including post-op infxn, seroma, biloma, chronic pain, poor-delayed wound healing, retained gallstone, conversion to open procedure, post-op SBO or ileus, and need for additional procedures to address said risks.  The risks of general anesthetic including MI, CVA, sudden death or even reaction to anesthetic medications also discussed. Alternatives include continued observation.  Benefits include possible symptom relief, prevention of complications including acute cholecystitis, pancreatitis.  Typical post operative recovery of 3-5 days rest, continued pain in area and  incision sites, possible loose stools up to 4-6 weeks, also discussed.  The patient understands the risks, any and all questions were answered to the patient's satisfaction.  She requested to go home to take care of babies and return tomorrow as outpt.  Will try to arrange.  labs/images/medications/previous chart entries reviewed personally and relevant changes/updates noted above.

## 2022-05-22 NOTE — ED Notes (Signed)
See triage note. Presents with abd pain  states pain started in middle of night  denies any fever or urinary sxs'   pain about 3 am

## 2022-05-22 NOTE — ED Triage Notes (Signed)
From home c/o mid abdominal pain that radiates upwards into chest. Pt 2 months postpartum but usually lasts a few minutes; states has been going on since 330AM tonight. Sharp pain with deep inspiration.

## 2022-05-22 NOTE — Anesthesia Preprocedure Evaluation (Signed)
Anesthesia Evaluation  Patient identified by MRN, date of birth, ID band Patient awake    Reviewed: Allergy & Precautions, NPO status , Patient's Chart, lab work & pertinent test results  Airway Mallampati: III  TM Distance: >3 FB Neck ROM: full    Dental  (+) Chipped   Pulmonary neg pulmonary ROS, former smoker,    Pulmonary exam normal        Cardiovascular negative cardio ROS Normal cardiovascular exam     Neuro/Psych  Headaches, Seizures - (complex partial),  PSYCHIATRIC DISORDERS Anxiety Depression Developmental delay   GI/Hepatic Neg liver ROS, cholecystitis   Endo/Other  negative endocrine ROS  Renal/GU      Musculoskeletal   Abdominal (+) + obese,   Peds  Hematology negative hematology ROS (+)   Anesthesia Other Findings Past Medical History: No date: ADHD No date: Anxiety No date: Depression No date: Epilepsy (HCC) 12/02/2019: Iron deficiency anemia No date: Migraine No date: Mood disorder (HCC) No date: Seizures (HCC)  Past Surgical History: No date: NO PAST SURGERIES  BMI    Body Mass Index: 33.95 kg/m      Reproductive/Obstetrics negative OB ROS                             Anesthesia Physical Anesthesia Plan  ASA: 2  Anesthesia Plan: General ETT   Post-op Pain Management: Tylenol PO (pre-op)*, Gabapentin PO (pre-op)* and Celebrex PO (pre-op)*   Induction: Intravenous  PONV Risk Score and Plan: 4 or greater and Ondansetron, Dexamethasone, Midazolam and Promethazine  Airway Management Planned:   Additional Equipment:   Intra-op Plan:   Post-operative Plan:   Informed Consent:     Dental Advisory Given  Plan Discussed with: Anesthesiologist, CRNA and Surgeon  Anesthesia Plan Comments:         Anesthesia Quick Evaluation

## 2022-05-22 NOTE — ED Provider Notes (Signed)
[facility] Provider Note    Event Date/Time   First MD Initiated Contact with Patient 05/22/22 [phone]     (approximate)   History   Abdominal Pain   HPI  Sarah Gomez is a 26 y.o. female who comes in with mid abdominal pain that radiates up towards her chest.  Patient is postpartum 2 months.  Patient reports having intermittent epigastric pain for the past 2 years.  She reports that however it is gotten worse in the past few months.  She reports that typically comes on every few weeks and then will go away after a few hours.  She reports that her pain today was more severe was epigastric in nature started around 4 AM and the pain has come down significantly but still slightly there.  She reports that when the pain comes on it she had a hard time catching her breath but she denies any shortness of breath or pain with breathing but the pain has calmed down.  She denies any leg swelling or history of blood clots.  Physical Exam   Triage Vital Signs: ED Triage Vitals [05/22/22 0432]  Enc Vitals Group     BP (!) 114/94     Pulse Rate 75     Resp 14     Temp 97.6 F (36.4 C)     Temp Source Oral     SpO2 100 %     Weight 204 lb (92.5 kg)     Height 5\' 5"  (1.651 m)     Head Circumference      Peak Flow      Pain Score 8     Pain Loc      Pain Edu?      Excl. in GC?     Most recent vital signs: Vitals:   05/22/22 0432  BP: (!) 114/94  Pulse: 75  Resp: 14  Temp: 97.6 F (36.4 C)  SpO2: 100%     General: Awake, no distress.  CV:  Good peripheral perfusion.  Resp:  Normal effort.  Abd:  No distention.  Epigastric tenderness but is mild in nature Other:  No swelling of the legs.  No calf tenderness   ED Results / Procedures / Treatments   Labs (all labs ordered are listed, but only abnormal results are displayed) Labs Reviewed  COMPREHENSIVE METABOLIC PANEL - Abnormal; Notable for the following components:      Result Value   AST 47 (*)     All other components within normal limits  CBC - Abnormal; Notable for the following components:   RBC 5.23 (*)    MCV 77.6 (*)    MCH 23.9 (*)    RDW 15.6 (*)    All other components within normal limits  URINALYSIS, ROUTINE W REFLEX MICROSCOPIC - Abnormal; Notable for the following components:   Color, Urine YELLOW (*)    APPearance HAZY (*)    Leukocytes,Ua SMALL (*)    All other components within normal limits  LIPASE, BLOOD  POC URINE PREG, ED     EKG  My interpretation of EKG:  Normal sinus rhythm 75 without any ST elevation, does have T wave inversion in lead III, V2 and V3, normal intervals-reviewed prior EKG and does look similar.  RADIOLOGY I have reviewed the ultrasound personally interpreted and there is evidence of gallstones  IMPRESSION: Cholelithiasis without gallbladder wall thickening or pericholecystic fluid. No biliary dilatation.    PROCEDURES:  Critical Care performed:  No  Procedures   MEDICATIONS ORDERED IN ED: Medications  acetaminophen (TYLENOL) tablet 1,000 mg (has no administration in time range)  oxyCODONE (Oxy IR/ROXICODONE) immediate release tablet 5 mg (has no administration in time range)     IMPRESSION / MDM / ASSESSMENT AND PLAN / ED COURSE  I reviewed the triage vital signs and the nursing notes.   Patient's presentation is most consistent with acute presentation with potential threat to life or bodily function.   Differential includes acid reflux, ulcer, gastritis, gallstones.  Given she reports catching her breath when the pain gets very severe I initially considered pulmonary embolism but given she denies any shortness of breath or pain with breathing at this time it does not seem consistent with that.  She is got no evidence of DVT based upon examination  Pregnancy test negative Lipase normal CMP slightly elevated AST but otherwise reassuring CBC normal  Multiple gallstones evident measuring up to 2.6 cm.   Given  patient's prolonged pain I discussed with the surgeon Dr. Tonna Boehringer who is going to come down to evaluate patient for potential cholecystectomy.  Patient given some p.o. medications with a few sips.   Clinical Course as of 05/22/22 0839  Mon May 22, 2022  0753 US ABDOMEN LIMITED RUQ (LIVER/GB) [MF]    Clinical Course User Index [MF] Concha Se, MD     FINAL CLINICAL IMPRESSION(S) / ED DIAGNOSES   Final diagnoses:  Biliary colic     Rx / DC Orders   ED Discharge Orders     None        Note:  This document was prepared using Dragon voice recognition software and may include unintentional dictation errors.   Concha Se, MD 05/22/22 [phone]

## 2022-05-22 NOTE — Consult Note (Signed)
Subjective:   CC: biliary colic  HPI:  Sarah Gomez is a 26 y.o. female who is consulted by [facility] for evaluation of above cc.  Symptoms were first noted 1 days ago. Pain is subxyphoid, woken from sleep, radiating to back.  Initial episode 2 years ago, weekly recently.  This most recent episode worst.  Associated with nothing, exacerbated by nothing     Past Medical History:  has a past medical history of ADHD, Anxiety, Depression, Epilepsy (HCC), Iron deficiency anemia (12/02/2019), Migraine, Mood disorder (HCC), and Seizures (HCC).  Past Surgical History:  has a past surgical history that includes No past surgeries.  Family History: family history includes Heart Problems in her sister; Hypertension in her father; Multiple sclerosis in her mother.  Social History:  reports that she has quit smoking. She has never used smokeless tobacco. She reports that she does not currently use alcohol. She reports that she does not currently use drugs after having used the following drugs: Marijuana.  Current Medications:  Prior to Admission medications   Medication Sig Start Date End Date Taking? Authorizing Provider  norgestimate-ethinyl estradiol (ORTHO-CYCLEN) 0.25-35 MG-MCG tablet Take 1 tablet by mouth daily. 02/03/20 08/29/20  Tresea Mall, CNM  promethazine (PHENERGAN) 12.5 MG tablet Take 1 tablet (12.5 mg total) by mouth every 6 (six) hours as needed for nausea or vomiting. 06/13/19 07/30/19  Jene Every, MD  sertraline (ZOLOFT) 25 MG tablet Take 1 tablet (25 mg total) by mouth daily. 02/03/20 08/29/20  Tresea Mall, CNM    Allergies:  Allergies as of 05/22/2022   (No Known Allergies)    ROS:  General: Denies weight loss, weight gain, fatigue, fevers, chills, and night sweats. Eyes: Denies blurry vision, double vision, eye pain, itchy eyes, and tearing. Ears: Denies hearing loss, earache, and ringing in ears. Nose: Denies sinus pain, congestion, infections, runny nose, and  nosebleeds. Mouth/throat: Denies hoarseness, sore throat, bleeding gums, and difficulty swallowing. Heart: Denies chest pain, palpitations, racing heart, irregular heartbeat, leg pain or swelling, and decreased activity tolerance. Respiratory: Denies breathing difficulty, shortness of breath, wheezing, cough, and sputum. GI: Denies change in appetite, heartburn, nausea, vomiting, constipation, diarrhea, and blood in stool. GU: Denies difficulty urinating, pain with urinating, urgency, frequency, blood in urine. Musculoskeletal: Denies joint stiffness, pain, swelling, muscle weakness. Skin: Denies rash, itching, mass, tumors, sores, and boils Neurologic: Denies headache, fainting, dizziness, seizures, numbness, and tingling. Psychiatric: Denies depression, anxiety, difficulty sleeping, and memory loss. Endocrine: Denies heat or cold intolerance, and increased thirst or urination. Blood/lymph: Denies easy bruising, and swollen glands     Objective:     BP 120/88 (BP Location: Left Arm)   Pulse 80   Temp 97.6 F (36.4 C) (Oral)   Resp 16   Ht 5\' 5"  (1.651 m)   Wt 92.5 kg   LMP 04/15/2022   SpO2 99%   BMI 33.95 kg/m    Constitutional :  alert, cooperative, and appears stated age  Lymphatics/Throat:  no asymmetry, masses, or scars  Respiratory:  clear to auscultation bilaterally  Cardiovascular:  regular rate and rhythm  Gastrointestinal: Soft, no guarding, mild TTP epigastric region.   Musculoskeletal: Steady movement  Skin: Cool and moist  Psychiatric: Normal affect, non-agitated, not confused       LABS:     Latest Ref Rng & Units 05/22/2022    4:34 AM 12/14/2021    2:23 PM 10/10/2021    5:01 PM  CMP  Glucose 70 - 99 mg/dL 99  84  94   BUN 6 - 20 mg/dL 18  6  6    Creatinine 0.44 - 1.00 mg/dL  6.54  6.50   Sodium 135 - 145 mmol/L 139  134  136   Potassium 3.5 - 5.1 mmol/L 3.7  3.6  3.8   Chloride 98 - 111 mmol/L 108  102  104   CO2 22 - 32 mmol/L 24  24  25     Calcium 8.9 - 10.3 mg/dL 9.0  8.1  9.1   Total Protein 6.5 - 8.1 g/dL 6.7  5.7  6.3   Total Bilirubin 0.3 - 1.2 mg/dL 0.5  0.5  0.5   Alkaline Phos 38 - 126 U/L 76  55  41   AST 15 - 41 U/L 47  17  23   ALT 0 - 44 U/L 25  12  18        Latest Ref Rng & Units 05/22/2022    4:34 AM 03/15/2022    5:55 AM 03/14/2022    9:38 AM  CBC  WBC 4.0 - 10.5 K/uL 10.1  12.8  8.1   Hemoglobin 12.0 - 15.0 g/dL 05/24/2022  9.2  05/15/2022   Hematocrit 36.0 - 46.0 % 40.6  29.6  35.6   Platelets 150 - 400 K/uL 258  237  268      RADS: CLINICAL DATA:  2 month history of epigastric pain.   EXAM: ULTRASOUND ABDOMEN LIMITED RIGHT UPPER QUADRANT   COMPARISON:  None Available.   FINDINGS: Gallbladder:   Multiple gallstones evident measuring up to 2.6 cm. No gallbladder wall thickening or pericholecystic fluid. Sonographer reports no sonographic Murphy sign.   Common bile duct:   Diameter: 4 mm   Liver:   No focal lesion identified. Within normal limits in parenchymal echogenicity. Portal vein is patent on color Doppler imaging with normal direction of blood flow towards the liver.   Other: None.   IMPRESSION: Cholelithiasis without gallbladder wall thickening or pericholecystic fluid. No biliary dilatation.     Electronically Signed   By: 05/14/2022 M.D.   On: 05/22/2022 07:04 Assessment:      Biliary colic  Plan:      Discussed the risk of surgery including post-op infxn, seroma, biloma, chronic pain, poor-delayed wound healing, retained gallstone, conversion to open procedure, post-op SBO or ileus, and need for additional procedures to address said risks.  The risks of general anesthetic including MI, CVA, sudden death or even reaction to anesthetic medications also discussed. Alternatives include continued observation.  Benefits include possible symptom relief, prevention of complications including acute cholecystitis, pancreatitis.  Typical post operative recovery of 3-5 days rest,  continued pain in area and incision sites, possible loose stools up to 4-6 weeks, also discussed.  The patient understands the risks, any and all questions were answered to the patient's satisfaction.  IV abx and to OR for lap chole labs/images/medications/previous chart entries reviewed personally and relevant changes/updates noted above.

## 2022-05-22 NOTE — ED Notes (Signed)
Pt given lunch tray.  Pt denying any pain or nausea at this time

## 2022-05-22 NOTE — H&P (View-Only) (Signed)
Subjective:  CC: biliary colic  HPI:  Sarah Gomez is a 26 y.o. female who is consulted by [facility] for evaluation of above cc.  Symptoms were first noted 1 days ago. Pain is subxyphoid, woken from sleep, radiating to back.  Initial episode 2 years ago, weekly recently.  This most recent episode worst.  Associated with nothing, exacerbated by nothing     Past Medical History:  has a past medical history of ADHD, Anxiety, Depression, Epilepsy (HCC), Iron deficiency anemia (12/02/2019), Migraine, Mood disorder (HCC), and Seizures (HCC).  Past Surgical History:  has a past surgical history that includes No past surgeries.  Family History: family history includes Heart Problems in her sister; Hypertension in her father; Multiple sclerosis in her mother.  Social History:  reports that she has quit smoking. She has never used smokeless tobacco. She reports that she does not currently use alcohol. She reports that she does not currently use drugs after having used the following drugs: Marijuana.  Current Medications:  Prior to Admission medications  Medication Sig Start Date End Date Taking? Authorizing Provider norgestimate-ethinyl estradiol (ORTHO-CYCLEN) 0.25-35 MG-MCG tablet Take 1 tablet by mouth daily. 02/03/20 08/29/20  Tresea Mall, CNM promethazine (PHENERGAN) 12.5 MG tablet Take 1 tablet (12.5 mg total) by mouth every 6 (six) hours as needed for nausea or vomiting. 06/13/19 07/30/19  Jene Every, MD sertraline (ZOLOFT) 25 MG tablet Take 1 tablet (25 mg total) by mouth daily. 02/03/20 08/29/20  Tresea Mall, CNM   Allergies:  Allergies as of 05/22/2022  (No Known Allergies)   ROS:  General: Denies weight loss, weight gain, fatigue, fevers, chills, and night sweats. Eyes: Denies blurry vision, double vision, eye pain, itchy eyes, and tearing. Ears: Denies hearing loss, earache, and ringing in ears. Nose: Denies sinus pain, congestion, infections, runny nose, and  nosebleeds. Mouth/throat: Denies hoarseness, sore throat, bleeding gums, and difficulty swallowing. Heart: Denies chest pain, palpitations, racing heart, irregular heartbeat, leg pain or swelling, and decreased activity tolerance. Respiratory: Denies breathing difficulty, shortness of breath, wheezing, cough, and sputum. GI: Denies change in appetite, heartburn, nausea, vomiting, constipation, diarrhea, and blood in stool. GU: Denies difficulty urinating, pain with urinating, urgency, frequency, blood in urine. Musculoskeletal: Denies joint stiffness, pain, swelling, muscle weakness. Skin: Denies rash, itching, mass, tumors, sores, and boils Neurologic: Denies headache, fainting, dizziness, seizures, numbness, and tingling. Psychiatric: Denies depression, anxiety, difficulty sleeping, and memory loss. Endocrine: Denies heat or cold intolerance, and increased thirst or urination. Blood/lymph: Denies easy bruising, and swollen glands     Objective:    BP 120/88 (BP Location: Left Arm)   Pulse 80   Temp 97.6 F (36.4 C) (Oral)   Resp 16   Ht 5\' 5"  (1.651 m)   Wt 92.5 kg   LMP 04/15/2022   SpO2 99%   BMI 33.95 kg/m    Constitutional :  alert, cooperative, and appears stated age Lymphatics/Throat:  no asymmetry, masses, or scars Respiratory:  clear to auscultation bilaterally Cardiovascular:  regular rate and rhythm Gastrointestinal: Soft, no guarding, mild TTP epigastric region .  Musculoskeletal: Steady movement Skin: Cool and moist Psychiatric: Normal affect, non-agitated, not confused     LABS:     Latest Ref Rng & Units 05/22/2022    4:34 AM 12/14/2021    2:23 PM 10/10/2021    5:01 PM CMP Glucose 70 - 99 mg/dL 99  84  94  BUN 6 - 20 mg/dL 18  6  6   Creatinine 0.44 - 1.00  mg/dL 4.00  8.67  6.19  Sodium 135 - 145 mmol/L 139  134   136  Potassium 3.5 - 5.1 mmol/L 3.7  3.6  3.8  Chloride 98 - 111 mmol/L 108  102  104  CO2 22 - 32 mmol/L 24  24  25   Calcium 8.9 - 10.3  mg/dL 9.0  8.1   9.1  Total Protein 6.5 - 8.1 g/dL 6.7  5.7   6.3   Total Bilirubin 0.3 - 1.2 mg/dL 0.5  0.5  0.5  Alkaline Phos 38 - 126 U/L 76  55  41  AST 15 - 41 U/L 47   17  23  ALT 0 - 44 U/L 25  12  18       Latest Ref Rng & Units 05/22/2022    4:34 AM 03/15/2022    5:55 AM 03/14/2022    9:38 AM CBC WBC 4.0 - 10.5 K/uL 10.1  12.8   8.1  Hemoglobin 12.0 - 15.0 g/dL 05/15/2022  9.2   05/14/2022   Hematocrit 36.0 - 46.0 % 40.6  29.6   35.6   Platelets 150 - 400 K/uL 258  237  268     RADS: CLINICAL DATA:  2 month history of epigastric pain.   EXAM: ULTRASOUND ABDOMEN LIMITED RIGHT UPPER QUADRANT   COMPARISON:  None Available.   FINDINGS: Gallbladder:   Multiple gallstones evident measuring up to 2.6 cm. No gallbladder wall thickening or pericholecystic fluid. Sonographer reports no sonographic Murphy sign.   Common bile duct:   Diameter: 4 mm   Liver:   No focal lesion identified. Within normal limits in parenchymal echogenicity. Portal vein is patent on color Doppler imaging with normal direction of blood flow towards the liver.   Other: None.   IMPRESSION: Cholelithiasis without gallbladder wall thickening or pericholecystic fluid. No biliary dilatation.     Electronically Signed   By: 50.9 M.D.   On: 05/22/2022 07:04 Assessment:     Biliary colic  Plan:     Discussed the risk of surgery including post-op infxn, seroma, biloma, chronic pain, poor-delayed wound healing, retained gallstone, conversion to open procedure, post-op SBO or ileus, and need for additional procedures to address said risks.  The risks of general anesthetic including MI, CVA, sudden death or even reaction to anesthetic medications also discussed. Alternatives include continued observation.  Benefits include possible symptom relief, prevention of complications including acute cholecystitis, pancreatitis.  Typical post operative recovery of 3-5 days rest, continued pain in area and  incision sites, possible loose stools up to 4-6 weeks, also discussed.  The patient understands the risks, any and all questions were answered to the patient's satisfaction.  She requested to go home to take care of babies and return tomorrow as outpt.  Will try to arrange.  labs/images/medications/previous chart entries reviewed personally and relevant changes/updates noted above.

## 2022-05-22 NOTE — ED Notes (Signed)
States she is waiting to see if she will be admitted  pain has eased off   no n/v at present

## 2022-05-23 ENCOUNTER — Inpatient Hospital Stay: Payer: 59 | Admitting: Anesthesiology

## 2022-05-23 ENCOUNTER — Other Ambulatory Visit: Payer: Self-pay

## 2022-05-23 ENCOUNTER — Encounter
Admission: EM | Disposition: A | Discharge status: Home / Self Care | Payer: Self-pay | Source: Home / Self Care | Attending: Emergency Medicine

## 2022-05-23 LAB — HEPATIC FUNCTION PANEL
ALT: 59 U/L — ABNORMAL HIGH (ref 0–44)
AST: 41 U/L (ref 15–41)
Albumin: 4.1 g/dL (ref 3.5–5.0)
Alkaline Phosphatase: 85 U/L (ref 38–126)
Bilirubin, Direct: <0.1 mg/dL (ref 0.0–0.2)
Total Bilirubin: 0.7 mg/dL (ref 0.3–1.2)
Total Protein: 7.2 g/dL (ref 6.5–8.1)

## 2022-05-23 LAB — BASIC METABOLIC PANEL
Anion gap: 5 (ref 5–15)
BUN: 11 mg/dL (ref 6–20)
CO2: 25 mmol/L (ref 22–32)
Calcium: 9.1 mg/dL (ref 8.9–10.3)
Chloride: 110 mmol/L (ref 98–111)
Creatinine, Ser: 0.73 mg/dL (ref 0.44–1.00)
GFR, Estimated: >60 mL/min (ref >60)
Glucose, Bld: 93 mg/dL (ref 70–99)
Potassium: 3.9 mmol/L (ref 3.5–5.1)
Sodium: 140 mmol/L (ref 135–145)

## 2022-05-23 LAB — CBC
HCT: 40 % (ref 36.0–46.0)
Hemoglobin: 12.5 g/dL (ref 12.0–15.0)
MCH: 23.9 pg — ABNORMAL LOW (ref 26.0–34.0)
MCHC: 31.3 g/dL (ref 30.0–36.0)
MCV: 76.3 fL — ABNORMAL LOW (ref 80.0–100.0)
Platelets: 251 10*3/uL (ref 150–400)
RBC: 5.24 MIL/uL — ABNORMAL HIGH (ref 3.87–5.11)
RDW: 15.5 % (ref 11.5–15.5)
WBC: 7.1 10*3/uL (ref 4.0–10.5)
nRBC: 0 % (ref 0.0–0.2)

## 2022-05-23 LAB — HIV ANTIBODY (ROUTINE TESTING W REFLEX): HIV Screen 4th Generation wRfx: NONREACTIVE

## 2022-05-23 SURGERY — CHOLECYSTECTOMY, ROBOT-ASSISTED, LAPAROSCOPIC
Anesthesia: General | Site: Abdomen

## 2022-05-23 MED ORDER — ACETAMINOPHEN 10 MG/ML IV SOLN: 1000.0000 mg | Freq: Once | INTRAVENOUS | Status: DC | PRN

## 2022-05-23 MED ORDER — IBUPROFEN 800 MG PO TABS
800.0000 mg | ORAL_TABLET | Freq: Three times a day (TID) | ORAL | 0 refills | Status: DC | PRN
Start: 2022-05-23 — End: 2023-02-13

## 2022-05-23 MED ORDER — FENTANYL CITRATE (PF) 100 MCG/2ML IJ SOLN
INTRAMUSCULAR | Status: DC | PRN
Start: 2022-05-23 — End: 2022-05-23
  Administered 2022-05-23 (×4): 50 ug via INTRAVENOUS

## 2022-05-23 MED ORDER — HYDROMORPHONE HCL 1 MG/ML IJ SOLN: 0.2500 mg | INTRAMUSCULAR | Status: DC | PRN

## 2022-05-23 MED ORDER — PROMETHAZINE HCL 25 MG/ML IJ SOLN: 6.2500 mg | INTRAMUSCULAR | Status: DC | PRN

## 2022-05-23 MED ORDER — LIDOCAINE-EPINEPHRINE (PF) 1 %-1:200000 IJ SOLN
INTRAMUSCULAR | Status: AC
  Filled 2022-05-23: qty 30

## 2022-05-23 MED ORDER — DEXAMETHASONE SODIUM PHOSPHATE 10 MG/ML IJ SOLN
INTRAMUSCULAR | Status: DC | PRN
  Administered 2022-05-23: 10 mg via INTRAVENOUS

## 2022-05-23 MED ORDER — PROPOFOL 10 MG/ML IV BOLUS
INTRAVENOUS | Status: AC
  Filled 2022-05-23: qty 20

## 2022-05-23 MED ORDER — CHLORHEXIDINE GLUCONATE 0.12 % MT SOLN
OROMUCOSAL | Status: AC
  Administered 2022-05-23: 15 mL
  Filled 2022-05-23: qty 15

## 2022-05-23 MED ORDER — ONDANSETRON HCL 4 MG/2ML IJ SOLN
INTRAMUSCULAR | Status: DC | PRN
  Administered 2022-05-23: 4 mg via INTRAVENOUS

## 2022-05-23 MED ORDER — LIDOCAINE HCL (CARDIAC) PF 100 MG/5ML IV SOSY
PREFILLED_SYRINGE | INTRAVENOUS | Status: DC | PRN
  Administered 2022-05-23: 100 mg via INTRAVENOUS

## 2022-05-23 MED ORDER — ROCURONIUM BROMIDE 10 MG/ML (PF) SYRINGE
PREFILLED_SYRINGE | INTRAVENOUS | Status: AC
  Filled 2022-05-23: qty 10

## 2022-05-23 MED ORDER — PROPOFOL 10 MG/ML IV BOLUS
INTRAVENOUS | Status: DC | PRN
  Administered 2022-05-23: 180 mg via INTRAVENOUS

## 2022-05-23 MED ORDER — BUPIVACAINE HCL (PF) 0.5 % IJ SOLN
INTRAMUSCULAR | Status: AC
  Filled 2022-05-23: qty 30

## 2022-05-23 MED ORDER — MIDAZOLAM HCL 2 MG/2ML IJ SOLN
INTRAMUSCULAR | Status: DC | PRN
  Administered 2022-05-23: 2 mg via INTRAVENOUS

## 2022-05-23 MED ORDER — FENTANYL CITRATE (PF) 100 MCG/2ML IJ SOLN
INTRAMUSCULAR | Status: AC
  Filled 2022-05-23: qty 2

## 2022-05-23 MED ORDER — LACTATED RINGERS IV SOLN: INTRAVENOUS | Status: DC | PRN

## 2022-05-23 MED ORDER — GABAPENTIN 300 MG PO CAPS
ORAL_CAPSULE | ORAL | Status: AC
  Administered 2022-05-23: 300 mg via ORAL
  Filled 2022-05-23: qty 1

## 2022-05-23 MED ORDER — CELECOXIB 200 MG PO CAPS
ORAL_CAPSULE | ORAL | Status: AC
  Administered 2022-05-23: 200 mg via ORAL
  Filled 2022-05-23: qty 1

## 2022-05-23 MED ORDER — DROPERIDOL 2.5 MG/ML IJ SOLN: 0.6250 mg | Freq: Once | INTRAMUSCULAR | Status: DC | PRN

## 2022-05-23 MED ORDER — DEXAMETHASONE SODIUM PHOSPHATE 10 MG/ML IJ SOLN
INTRAMUSCULAR | Status: AC
Start: 2022-05-23 — End: ?
  Filled 2022-05-23: qty 1

## 2022-05-23 MED ORDER — LIDOCAINE-EPINEPHRINE (PF) 1 %-1:200000 IJ SOLN
INTRAMUSCULAR | Status: DC | PRN
  Administered 2022-05-23: 20 mL via SUBCUTANEOUS

## 2022-05-23 MED ORDER — HYDROCODONE-ACETAMINOPHEN 5-325 MG PO TABS: 1.0000 | ORAL_TABLET | Freq: Four times a day (QID) | ORAL | 0 refills | Status: DC | PRN

## 2022-05-23 MED ORDER — ACETAMINOPHEN 500 MG PO TABS
ORAL_TABLET | ORAL | Status: AC
  Administered 2022-05-23: 1000 mg via ORAL
  Filled 2022-05-23: qty 2

## 2022-05-23 MED ORDER — ACETAMINOPHEN 325 MG PO TABS: 650.0000 mg | ORAL_TABLET | Freq: Three times a day (TID) | ORAL | 0 refills | Status: AC | PRN

## 2022-05-23 MED ORDER — ONDANSETRON HCL 4 MG/2ML IJ SOLN
INTRAMUSCULAR | Status: AC
Start: 2022-05-23 — End: ?
  Filled 2022-05-23: qty 2

## 2022-05-23 MED ORDER — SODIUM CHLORIDE FLUSH 0.9 % IV SOLN
INTRAVENOUS | Status: AC
  Filled 2022-05-23: qty 10

## 2022-05-23 MED ORDER — 0.9 % SODIUM CHLORIDE (POUR BTL) OPTIME
TOPICAL | Status: DC | PRN
  Administered 2022-05-23: 500 mL

## 2022-05-23 MED ORDER — ROCURONIUM BROMIDE 100 MG/10ML IV SOLN
INTRAVENOUS | Status: DC | PRN
  Administered 2022-05-23: 60 mg via INTRAVENOUS

## 2022-05-23 MED ORDER — SUGAMMADEX SODIUM 200 MG/2ML IV SOLN
INTRAVENOUS | Status: DC | PRN
  Administered 2022-05-23: 200 mg via INTRAVENOUS

## 2022-05-23 MED ORDER — PROMETHAZINE HCL 25 MG/ML IJ SOLN
INTRAMUSCULAR | Status: AC
  Administered 2022-05-23: 6.25 mg via INTRAVENOUS
  Filled 2022-05-23: qty 1

## 2022-05-23 MED ORDER — DOCUSATE SODIUM 100 MG PO CAPS: 100.0000 mg | ORAL_CAPSULE | Freq: Two times a day (BID) | ORAL | 0 refills | Status: AC | PRN

## 2022-05-23 MED ORDER — CEFAZOLIN SODIUM-DEXTROSE 2-4 GM/100ML-% IV SOLN
INTRAVENOUS | Status: AC
  Filled 2022-05-23: qty 100

## 2022-05-23 MED ORDER — MIDAZOLAM HCL 2 MG/2ML IJ SOLN
INTRAMUSCULAR | Status: AC
  Filled 2022-05-23: qty 2

## 2022-05-23 MED ORDER — DEXMEDETOMIDINE (PRECEDEX) IN NS 20 MCG/5ML (4 MCG/ML) IV SYRINGE
PREFILLED_SYRINGE | INTRAVENOUS | Status: DC | PRN
  Administered 2022-05-23 (×2): 8 ug via INTRAVENOUS

## 2022-05-23 SURGICAL SUPPLY — 52 items
ANCHOR TIS RET SYS 235ML (MISCELLANEOUS) ×2 IMPLANT
BAG PRESSURE INF REUSE 1000 (BAG) IMPLANT
BLADE SURG SZ11 CARB STEEL (BLADE) ×2 IMPLANT
CANNULA REDUC XI 12-8 STAPL (CANNULA) ×1
CANNULA REDUCER 12-8 DVNC XI (CANNULA) ×1 IMPLANT
CATH REDDICK CHOLANGI 4FR 50CM (CATHETERS) IMPLANT
CLIP LIGATING HEMO O LOK GREEN (MISCELLANEOUS) ×2 IMPLANT
DERMABOND ADVANCED (GAUZE/BANDAGES/DRESSINGS) ×1
DERMABOND ADVANCED .7 DNX12 (GAUZE/BANDAGES/DRESSINGS) ×1 IMPLANT
DRAPE ARM DVNC X/XI (DISPOSABLE) ×4 IMPLANT
DRAPE C-ARM XRAY 36X54 (DRAPES) IMPLANT
DRAPE COLUMN DVNC XI (DISPOSABLE) ×1 IMPLANT
DRAPE DA VINCI XI ARM (DISPOSABLE) ×4
DRAPE DA VINCI XI COLUMN (DISPOSABLE) ×1
ELECT CAUTERY BLADE 6.4 (BLADE) ×2 IMPLANT
ELECT REM PT RETURN 9FT ADLT (ELECTROSURGICAL) ×2
ELECTRODE REM PT RTRN 9FT ADLT (ELECTROSURGICAL) ×1 IMPLANT
GLOVE BIOGEL PI IND STRL 7.0 (GLOVE) ×2 IMPLANT
GLOVE BIOGEL PI INDICATOR 7.0 (GLOVE) ×2
GLOVE SURG SYN 6.5 ES PF (GLOVE) ×4 IMPLANT
GLOVE SURG SYN 6.5 PF PI (GLOVE) ×2 IMPLANT
GOWN STRL REUS W/ TWL LRG LVL3 (GOWN DISPOSABLE) ×3 IMPLANT
GOWN STRL REUS W/TWL LRG LVL3 (GOWN DISPOSABLE) ×3
GRASPER SUT TROCAR 14GX15 (MISCELLANEOUS) IMPLANT
IRRIGATOR SUCT 8 DISP DVNC XI (IRRIGATION / IRRIGATOR) IMPLANT
IRRIGATOR SUCTION 8MM XI DISP (IRRIGATION / IRRIGATOR)
IV NS 1000ML (IV SOLUTION)
IV NS 1000ML BAXH (IV SOLUTION) IMPLANT
LABEL OR SOLS (LABEL) ×2 IMPLANT
MANIFOLD NEPTUNE II (INSTRUMENTS) ×2 IMPLANT
NDL INSUFFLATION 14GA 120MM (NEEDLE) ×1 IMPLANT
NEEDLE HYPO 22GX1.5 SAFETY (NEEDLE) ×2 IMPLANT
NEEDLE INSUFFLATION 14GA 120MM (NEEDLE) ×2 IMPLANT
NS IRRIG 500ML POUR BTL (IV SOLUTION) ×2 IMPLANT
OBTURATOR OPTICAL STANDARD 8MM (TROCAR) ×1
OBTURATOR OPTICAL STND 8 DVNC (TROCAR) ×1
OBTURATOR OPTICALSTD 8 DVNC (TROCAR) ×1 IMPLANT
PACK LAP CHOLECYSTECTOMY (MISCELLANEOUS) ×2 IMPLANT
PENCIL ELECTRO HAND CTR (MISCELLANEOUS) ×2 IMPLANT
SEAL CANN UNIV 5-8 DVNC XI (MISCELLANEOUS) ×3 IMPLANT
SEAL XI 5MM-8MM UNIVERSAL (MISCELLANEOUS) ×3
SET TUBE SMOKE EVAC HIGH FLOW (TUBING) ×2 IMPLANT
SOLUTION ELECTROLUBE (MISCELLANEOUS) ×2 IMPLANT
SPIKE FLUID TRANSFER (MISCELLANEOUS) ×4 IMPLANT
STAPLER CANNULA SEAL DVNC XI (STAPLE) ×1 IMPLANT
STAPLER CANNULA SEAL XI (STAPLE) ×1
SUT MNCRL 4-0 (SUTURE) ×2
SUT MNCRL 4-0 27XMFL (SUTURE) ×2
SUT VICRYL 0 AB UR-6 (SUTURE) ×2 IMPLANT
SUTURE MNCRL 4-0 27XMF (SUTURE) ×2 IMPLANT
SYR 30ML LL (SYRINGE) IMPLANT
SYSTEM WECK SHIELD CLOSURE (TROCAR) IMPLANT

## 2022-05-23 NOTE — Interval H&P Note (Signed)
History and Physical Interval Note:  05/23/2022 11:01 AM  Sarah Gomez  has presented today for surgery, with the diagnosis of Cholecystitis.  The various methods of treatment have been discussed with the patient and family. After consideration of risks, benefits and other options for treatment, the patient has consented to  Procedure(s): XI ROBOTIC ASSISTED LAPAROSCOPIC CHOLECYSTECTOMY (N/A) INDOCYANINE GREEN FLUORESCENCE IMAGING (ICG) (N/A) as a surgical intervention.  The patient's history has been reviewed, patient examined, no change in status, stable for surgery.  I have reviewed the patient's chart and labs.  Questions were answered to the patient's satisfaction.     Grafton Warzecha Tonna Boehringer

## 2022-05-23 NOTE — Transfer of Care (Signed)
Immediate Anesthesia Transfer of Care Note  Patient: Sarah Gomez  Procedure(s) Performed: XI ROBOTIC ASSISTED LAPAROSCOPIC CHOLECYSTECTOMY (Abdomen) INDOCYANINE GREEN FLUORESCENCE IMAGING (ICG) (Abdomen)  Patient Location: PACU  Anesthesia Type:General  Level of Consciousness: sedated  Airway & Oxygen Therapy: Patient Spontanous Breathing and Patient connected to face mask oxygen  Post-op Assessment: Report given to RN and Post -op Vital signs reviewed and stable  Post vital signs: Reviewed and stable  Last Vitals:  Vitals Value Taken Time  BP 126/78 05/23/22 1307  Temp    Pulse 81 05/23/22 1311  Resp 25 05/23/22 1311  SpO2 100 % 05/23/22 1311  Vitals shown include unvalidated device data.  Last Pain:  Vitals:   05/23/22 1307  TempSrc:   PainSc: Asleep         Complications: No notable events documented.

## 2022-05-23 NOTE — Anesthesia Procedure Notes (Signed)
Procedure Name: Intubation Date/Time: 05/23/2022 11:54 AM  Performed by: Irving Burton, CRNAPre-anesthesia Checklist: Patient identified, Patient being monitored, Timeout performed, Emergency Drugs available and Suction available Patient Re-evaluated:Patient Re-evaluated prior to induction Oxygen Delivery Method: Circle system utilized Preoxygenation: Pre-oxygenation with 100% oxygen Induction Type: IV induction Ventilation: Mask ventilation without difficulty Laryngoscope Size: 3 and McGraph Grade View: Grade I Tube type: Oral Tube size: 7.0 mm Number of attempts: 1 Airway Equipment and Method: Stylet and Video-laryngoscopy Placement Confirmation: ETT inserted through vocal cords under direct vision, positive ETCO2 and breath sounds checked- equal and bilateral Secured at: 21 cm Tube secured with: Tape Dental Injury: Teeth and Oropharynx as per pre-operative assessment

## 2022-05-23 NOTE — Op Note (Signed)
Preoperative diagnosis:  chronic and cholecystitis  Postoperative diagnosis: same as above  Procedure: Robotic assisted Laparoscopic Cholecystectomy.   Anesthesia: GETA   Surgeon: Sung Amabile  Specimen: Gallbladder  Complications: None  EBL: 10mL  Wound Classification: Clean Contaminated  Indications: see HPI  Findings: Critical view of safety noted Cystic duct and artery identified, ligated and divided, clips remained intact at end of procedure Adequate hemostasis  Description of procedure:  The patient was placed on the operating table in the supine position. SCDs placed, pre-op abx administered.  General anesthesia was induced and OG tube placed by anesthesia. A time-out was completed verifying correct patient, procedure, site, positioning, and implant(s) and/or special equipment prior to beginning this procedure. The abdomen was prepped and draped in the usual sterile fashion.    Veress needle was placed at the Palmer's point and insufflation was started after confirming a positive saline drop test and no immediate increase in abdominal pressure.  After reaching 15 mm, the Veress needle was removed and a 8 mm port was placed via optiview technique under umbilicus measured 22mm from gallbladder.  The abdomen was inspected and no abnormalities or injuries were found.  Under direct vision, ports were placed in the following locations: One 12 mm patient left of the umbilicus, 8cm from the optiviewed port, one 8 mm port placed to the patient right of the umbilical port 8 cm apart.  1 additional 8 mm port placed lateral to the 46mm port.  Once ports were placed, The table was placed in the reverse Trendelenburg position with the right side up. The Xi platform was brought into the operative field and docked to the ports successfully.  An endoscope was placed through the umbilical port, fenestrated grasper through the adjacent patient right port, prograsp to the far patient left port, and  then a hook cautery in the left port.  The dome of the gallbladder was grasped with prograsp, passed and retracted over the dome of the liver. Adhesions between the gallbladder and omentum, duodenum and transverse colon were lysed via hook cautery. The infundibulum was grasped with the fenestrated grasper and retracted toward the right lower quadrant. This maneuver exposed Calot's triangle. The peritoneum overlying the gallbladder infundibulum was then dissected  and the cystic duct and cystic artery identified.  Critical view of safety with the liver bed clearly visible behind the duct and artery with no additional structures noted.  The cystic duct and cystic artery clipped and divided close to the gallbladder.     The gallbladder was then dissected from its peritoneal and liver bed attachments by electrocautery. Hemostasis was checked prior to removing the hook cautery and the Endo Catch bag was then placed through the 12 mm port and the gallbladder was removed.  The gallbladder was passed off the table as a specimen. There was no evidence of bleeding from the gallbladder fossa or cystic artery or leakage of the bile from the cystic duct stump. The 12 mm port site closed with PMI using 0 vicryl under direct vision.  Abdomen desufflated and secondary trocars were removed under direct vision. No bleeding was noted. All skin incisions then closed with subcuticular sutures of 4-0 monocryl and dressed with topical skin adhesive. The orogastric tube was removed and patient extubated.  The patient tolerated the procedure well and was taken to the postanesthesia care unit in stable condition.  All sponge and instrument count correct at end of procedure.

## 2022-05-23 NOTE — Discharge Summary (Signed)
Physician Discharge Summary  Patient ID: Sarah Gomez MRN: [id number] DOB/AGE: August 19, 1996 26 y.o.  Admit date: 05/22/2022 Discharge date: 05/23/22  Admission Diagnoses: chronic cholecystitis  Discharge Diagnoses:  Same as above  Discharged Condition: good  Hospital Course: admitted for above. Underwent surgery.  Please see op note for details.  Post op, recovered as expected.  At time of d/c, tolerating diet and pain controlled  Consults: None  Discharge Exam: Blood pressure 123/72, pulse 68, temperature 97.8 F (36.6 C), temperature source Temporal, resp. rate 18, height 5\' 5"  (1.651 m), weight 92.5 kg, last menstrual period 04/15/2022, SpO2 100 %, not currently breastfeeding. General appearance: alert, cooperative, and no distress GI: soft, no guarding, appropriate TTP around incisions  Disposition:  Discharge disposition: 01-Home or Self Care       Discharge Instructions     Discharge patient   Complete by: As directed    Discharge disposition: 01-Home or Self Care   Discharge patient date: 05/23/2022      Allergies as of 05/23/2022   No Known Allergies      Medication List     TAKE these medications    acetaminophen 325 MG tablet Commonly known as: Tylenol Take 2 tablets (650 mg total) by mouth every 8 (eight) hours as needed for mild pain.   docusate sodium 100 MG capsule Commonly known as: Colace Take 1 capsule (100 mg total) by mouth 2 (two) times daily as needed for up to 10 days for mild constipation.   HYDROcodone-acetaminophen 5-325 MG tablet Commonly known as: Norco Take 1 tablet by mouth every 6 (six) hours as needed for up to 6 doses for moderate pain.   ibuprofen 800 MG tablet Commonly known as: ADVIL Take 1 tablet (800 mg total) by mouth every 8 (eight) hours as needed for mild pain or moderate pain.        Follow-up Information     05/25/2022, Wesson Stith, DO Follow up in 2 week(s).   Specialty: Surgery Why: post op lap chole Contact  information: [address] [place] [phone]                  Total time spent arranging discharge was >7min. Signed: 31m 05/23/2022, 1:06 PM

## 2022-05-23 NOTE — Discharge Instructions (Signed)
Laparoscopic Cholecystectomy, Care After This sheet gives you information about how to care for yourself after your procedure. Your doctor may also give you more specific instructions. If you have problems or questions, contact your doctor. Follow these instructions at home: Care for cuts from surgery (incisions)  Follow instructions from your doctor about how to take care of your cuts from surgery. Make sure you: Wash your hands with soap and water before you change your bandage (dressing). If you cannot use soap and water, use hand sanitizer. Change your bandage as told by your doctor. Leave stitches (sutures), skin glue, or skin tape (adhesive) strips in place. They may need to stay in place for 2 weeks or longer. If tape strips get loose and curl up, you may trim the loose edges. Do not remove tape strips completely unless your doctor says it is okay. Do not take baths, swim, or use a hot tub until your doctor says it is okay. OK TO SHOWER 24HRS AFTER YOUR SURGERY.  Check your surgical cut area every day for signs of infection. Check for: More redness, swelling, or pain. More fluid or blood. Warmth. Pus or a bad smell. Activity Do not drive or use heavy machinery while taking prescription pain medicine. Do not play contact sports until your doctor says it is okay. Do not drive for 24 hours if you were given a medicine to help you relax (sedative). Rest as needed. Do not return to work or school until your doctor says it is okay. General instructions  tylenol and advil as needed for discomfort.  Please alternate between the two every four hours as needed for pain.    Use narcotics, if prescribed, only when tylenol and motrin is not enough to control pain.  325-650mg every 8hrs to max of 3000mg/24hrs (including the 325mg in every norco dose) for the tylenol.    Advil up to 800mg per dose every 8hrs as needed for pain.   To prevent or treat constipation while you are taking prescription  pain medicine, your doctor may recommend that you: Drink enough fluid to keep your pee (urine) clear or pale yellow. Take over-the-counter or prescription medicines. Eat foods that are high in fiber, such as fresh fruits and vegetables, whole grains, and beans. Limit foods that are high in fat and processed sugars, such as fried and sweet foods. Contact a doctor if: You develop a rash. You have more redness, swelling, or pain around your surgical cuts. You have more fluid or blood coming from your surgical cuts. Your surgical cuts feel warm to the touch. You have pus or a bad smell coming from your surgical cuts. You have a fever. One or more of your surgical cuts breaks open. You have trouble breathing. You have chest pain. You have pain that is getting worse in your shoulders. You faint or feel dizzy when you stand. You have very bad pain in your belly (abdomen). You are sick to your stomach (nauseous) for more than one day. You have throwing up (vomiting) that lasts for more than one day. You have leg pain. This information is not intended to replace advice given to you by your health care provider. Make sure you discuss any questions you have with your health care provider. Document Released: 08/29/2008 Document Revised: 06/10/2016 Document Reviewed: 05/08/2016 Elsevier Interactive Patient Education  2019 Elsevier Inc.  

## 2022-05-24 LAB — SURGICAL PATHOLOGY

## 2022-05-24 NOTE — Anesthesia Postprocedure Evaluation (Signed)
Anesthesia Post Note  Patient: Sarah Gomez  Procedure(s) Performed: XI ROBOTIC ASSISTED LAPAROSCOPIC CHOLECYSTECTOMY (Abdomen) INDOCYANINE GREEN FLUORESCENCE IMAGING (ICG) (Abdomen)  Patient location during evaluation: PACU Anesthesia Type: General Level of consciousness: awake and alert Pain management: pain level controlled Vital Signs Assessment: post-procedure vital signs reviewed and stable Respiratory status: spontaneous breathing, nonlabored ventilation and respiratory function stable Cardiovascular status: blood pressure returned to baseline and stable Postop Assessment: no apparent nausea or vomiting Anesthetic complications: no   No notable events documented.   Last Vitals:  Vitals:   05/23/22 1345 05/23/22 1433  BP: (!) 129/97 108/60  Pulse: 97 61  Resp: (!) 23 18  Temp: (!) 36.1 C 37.1 C  SpO2: 100% 100%    Last Pain:  Vitals:   05/23/22 1433  TempSrc: Axillary  PainSc:                  Foye Deer

## 2022-06-01 ENCOUNTER — Emergency Department: Payer: 59

## 2022-06-01 ENCOUNTER — Encounter: Payer: Self-pay | Admitting: *Deleted

## 2022-06-01 ENCOUNTER — Emergency Department
Admission: EM | Admit: 2022-06-01 | Discharge: 2022-06-01 | Disposition: A | Discharge status: Home / Self Care | Payer: 59 | Attending: Orthopedic Surgery | Admitting: Orthopedic Surgery

## 2022-06-01 ENCOUNTER — Other Ambulatory Visit: Payer: Self-pay

## 2022-06-01 DIAGNOSIS — M62830 Muscle spasm of back: Secondary | ICD-10-CM | POA: Insufficient documentation

## 2022-06-01 DIAGNOSIS — M546 Pain in thoracic spine: Secondary | ICD-10-CM | POA: Diagnosis present

## 2022-06-01 MED ORDER — HYDROCODONE-ACETAMINOPHEN 5-325 MG PO TABS: 1.0000 | ORAL_TABLET | Freq: Four times a day (QID) | ORAL | 0 refills | Status: DC | PRN

## 2022-06-01 MED ORDER — CYCLOBENZAPRINE HCL 10 MG PO TABS
10.0000 mg | ORAL_TABLET | Freq: Once | ORAL | Status: AC
  Administered 2022-06-01: 10 mg via ORAL
  Filled 2022-06-01: qty 1

## 2022-06-01 MED ORDER — HYDROCODONE-ACETAMINOPHEN 5-325 MG PO TABS
1.0000 | ORAL_TABLET | ORAL | Status: AC
  Administered 2022-06-01: 1 via ORAL
  Filled 2022-06-01: qty 1

## 2022-06-01 MED ORDER — CYCLOBENZAPRINE HCL 5 MG PO TABS: 5.0000 mg | ORAL_TABLET | Freq: Three times a day (TID) | ORAL | 0 refills | Status: DC | PRN

## 2022-06-01 NOTE — ED Notes (Signed)
Pt verbalized understanding of discharge instructions, prescriptions, and follow-up care instructions. Pt advised if symptoms worsen to return to ED.  

## 2022-06-01 NOTE — ED Notes (Signed)
With Xray 

## 2022-06-01 NOTE — ED Triage Notes (Addendum)
Pt had gallbladder surgery last week at [facility].  Pt continues to have right side mid back pain.  No known injury.  No abd pain.  No n/v/d  pt alert.

## 2022-06-01 NOTE — ED Notes (Signed)
Pt presents to ED with c/o back pain. Pt states the pain has been going on since Monday. Pt had gallbladder surgery done last week. Pt denies pain in abdomen.

## 2022-06-01 NOTE — Discharge Instructions (Signed)
Please apply heating pad to the midline of thoracic spine.  Take medications as prescribed and avoid any lifting pushing or pulling.  Return to the ER if you experience any fevers, chest pain, shortness of breath, vomiting or abdominal pain.

## 2022-06-01 NOTE — ED Provider Notes (Signed)
[facility] EMERGENCY DEPARTMENT Provider Note   CSN: [id number] Arrival date & time: 06/01/22  1952     History  Chief Complaint  Patient presents with   Back Pain    Sarah Gomez is a 26 y.o. female.  Presents to the emergency department for evaluation of midline thoracic back pain x4 days.  No known trauma or injury.  She describes focal back pain to the right paravertebral muscle of the thoracic spine.  She denies any fevers chills nausea vomiting or diarrhea.  No abdominal pain, distention,.  Her incision sites are doing well.  Patient states she went back to work 6 days after her surgery. HPI     Home Medications Prior to Admission medications   Medication Sig Start Date End Date Taking? Authorizing Provider  cyclobenzaprine (FLEXERIL) 5 MG tablet Take 1-2 tablets (5-10 mg total) by mouth 3 (three) times daily as needed for muscle spasms. 06/01/22  Yes Evon Slack, PA-C  HYDROcodone-acetaminophen (NORCO) 5-325 MG tablet Take 1 tablet by mouth every 6 (six) hours as needed for moderate pain. 06/01/22  Yes Evon Slack, PA-C  acetaminophen (TYLENOL) 325 MG tablet Take 2 tablets (650 mg total) by mouth every 8 (eight) hours as needed for mild pain. 05/23/22 06/22/22  Tonna Boehringer, Isami, DO  docusate sodium (COLACE) 100 MG capsule Take 1 capsule (100 mg total) by mouth 2 (two) times daily as needed for up to 10 days for mild constipation. 05/23/22 06/02/22  Tonna Boehringer, Isami, DO  ibuprofen (ADVIL) 800 MG tablet Take 1 tablet (800 mg total) by mouth every 8 (eight) hours as needed for mild pain or moderate pain. 05/23/22   Sung Amabile, DO  norgestimate-ethinyl estradiol (ORTHO-CYCLEN) 0.25-35 MG-MCG tablet Take 1 tablet by mouth daily. 02/03/20 08/29/20  Tresea Mall, CNM  promethazine (PHENERGAN) 12.5 MG tablet Take 1 tablet (12.5 mg total) by mouth every 6 (six) hours as needed for nausea or vomiting. 06/13/19 07/30/19  Jene Every, MD  sertraline (ZOLOFT) 25 MG  tablet Take 1 tablet (25 mg total) by mouth daily. 02/03/20 08/29/20  Tresea Mall, CNM      Allergies    Patient has no known allergies.    Review of Systems   Review of Systems  Physical Exam Updated Vital Signs BP 124/79 (BP Location: Left Arm)   Pulse 84   Temp 98.5 F (36.9 C) (Oral)   Resp 18   Ht 5\' 5"  (1.651 m)   Wt 93.4 kg   LMP  (LMP Unknown)   SpO2 100%   BMI 34.28 kg/m  Physical Exam Constitutional:      Appearance: She is well-developed.  HENT:     Head: Normocephalic and atraumatic.     Right Ear: External ear normal.     Left Ear: External ear normal.     Nose: Nose normal.  Eyes:     Conjunctiva/sclera: Conjunctivae normal.     Pupils: Pupils are equal, round, and reactive to light.  Cardiovascular:     Rate and Rhythm: Normal rate.  Pulmonary:     Effort: Pulmonary effort is normal. No respiratory distress.     Breath sounds: Normal breath sounds.  Abdominal:     General: Bowel sounds are normal. There is no distension.     Palpations: Abdomen is soft.     Tenderness: There is no abdominal tenderness. There is no guarding.     Comments: Abdominal incision sites appear well, Dermabond is intact no signs of  any infectious process.  Abdomen soft nontender nondistended  Musculoskeletal:        General: No deformity. Normal range of motion.     Cervical back: Normal range of motion.     Comments: Thoracic spine is tender just along T8 and to the right of T8 all the paravertebral muscles there is muscle spasm noted this is palpable and tender with no warmth or redness.  She has no tenderness percussion along the lower lumbar spine or flank bilaterally.  Skin:    General: Skin is warm and dry.     Findings: No rash.  Neurological:     Mental Status: She is alert and oriented to person, place, and time.     Cranial Nerves: No cranial nerve deficit.     Coordination: Coordination normal.  Psychiatric:        Behavior: Behavior normal.     ED Results /  Procedures / Treatments   Labs (all labs ordered are listed, but only abnormal results are displayed) Labs Reviewed - No data to display  EKG None  Radiology DG Thoracic Spine 2 View  Result Date: 06/01/2022 CLINICAL DATA:  Back pain EXAM: THORACIC SPINE 2 VIEWS COMPARISON:  None Available. FINDINGS: There is no evidence of thoracic spine fracture. Alignment is normal. No other significant bone abnormalities are identified. IMPRESSION: Negative. Electronically Signed   By: Helyn Numbers M.D.   On: 06/01/2022 22:08    Procedures Procedures   Medications Ordered in ED Medications  HYDROcodone-acetaminophen (NORCO/VICODIN) 5-325 MG per tablet 1 tablet (1 tablet Oral Given 06/01/22 2239)  cyclobenzaprine (FLEXERIL) tablet 10 mg (10 mg Oral Given 06/01/22 2239)    ED Course/ Medical Decision Making/ A&P                           Medical Decision Making Amount and/or Complexity of Data Reviewed Radiology: ordered.  Risk Prescription drug management.   26 year old female with thoracic back pain.  No known trauma or injury.  Patient did return to work performing a lot of bending, lifting 5 or 6 days after cholecystectomy.  She denies any abdominal pain, fevers, chills, nausea or vomiting.  Back pain is worse when she moves her right upper arm.  She denies any numbness or tingling in the upper extremities.  She is given muscle relaxer and Norco and will continue with over-the-counter medications.  She will apply heating pad and given a note to remain out of work over the next few days. Final Clinical Impression(s) / ED Diagnoses Final diagnoses:  Muscle spasm of back  Acute midline thoracic back pain    Rx / DC Orders ED Discharge Orders          Ordered    HYDROcodone-acetaminophen (NORCO) 5-325 MG tablet  Every 6 hours PRN        06/01/22 2242    cyclobenzaprine (FLEXERIL) 5 MG tablet  3 times daily PRN        06/01/22 2242              Evon Slack, PA-C 06/01/22  2246    Chesley Noon, MD 06/05/22 1458

## 2022-08-03 ENCOUNTER — Encounter: Payer: Self-pay | Admitting: Oncology

## 2022-11-07 ENCOUNTER — Ambulatory Visit
Admission: EM | Admit: 2022-11-07 | Discharge: 2022-11-07 | Disposition: A | Discharge status: Home / Self Care | Payer: 59 | Attending: Nurse Practitioner | Admitting: Nurse Practitioner

## 2022-11-07 ENCOUNTER — Ambulatory Visit (INDEPENDENT_AMBULATORY_CARE_PROVIDER_SITE_OTHER): Payer: 59

## 2022-11-07 DIAGNOSIS — R0781 Pleurodynia: Secondary | ICD-10-CM

## 2022-11-07 DIAGNOSIS — S20212A Contusion of left front wall of thorax, initial encounter: Secondary | ICD-10-CM | POA: Diagnosis not present
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
## 2022-11-07 NOTE — Discharge Instructions (Signed)
Ice to the area as needed Rest Over the counter tylenol as needed for pain  Please follow up with your PCP if your symptoms do not improve Please go to the ER for any worsening symptoms. This includes but is not limited to worsening pain, shortness of breath, coughing up blood, or any new concerns that arise. I hope you feel better soon!
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
## 2022-12-18 ENCOUNTER — Ambulatory Visit
Admission: EM | Admit: 2022-12-18 | Discharge: 2022-12-18 | Disposition: A | Discharge status: Home / Self Care | Payer: 59 | Attending: Urgent Care | Admitting: Urgent Care

## 2022-12-18 DIAGNOSIS — A084 Viral intestinal infection, unspecified: Secondary | ICD-10-CM | POA: Diagnosis not present

## 2022-12-18 DIAGNOSIS — R112 Nausea with vomiting, unspecified: Secondary | ICD-10-CM | POA: Diagnosis not present

## 2022-12-18 MED ORDER — ONDANSETRON 4 MG PO TBDP
4.0000 mg | ORAL_TABLET | Freq: Once | ORAL | Status: AC
  Administered 2022-12-18: 4 mg via ORAL

## 2022-12-18 MED ORDER — ONDANSETRON HCL 4 MG PO TABS: 4.0000 mg | ORAL_TABLET | Freq: Four times a day (QID) | ORAL | 0 refills | Status: AC

## 2022-12-18 NOTE — Discharge Instructions (Signed)
Follow up here or with your primary care provider if your symptoms are worsening or not improving.     
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
## 2023-02-13 ENCOUNTER — Ambulatory Visit
Admission: EM | Admit: 2023-02-13 | Discharge: 2023-02-13 | Disposition: A | Discharge status: Home / Self Care | Payer: 59 | Attending: Internal Medicine | Admitting: Internal Medicine

## 2023-02-13 ENCOUNTER — Encounter: Payer: Self-pay | Admitting: Emergency Medicine

## 2023-02-13 DIAGNOSIS — J020 Streptococcal pharyngitis: Secondary | ICD-10-CM | POA: Diagnosis not present

## 2023-02-13 LAB — GROUP A STREP BY PCR: Group A Strep by PCR: DETECTED — AB

## 2023-02-13 MED ORDER — AMOXICILLIN 500 MG PO CAPS: 500.0000 mg | ORAL_CAPSULE | Freq: Two times a day (BID) | ORAL | 0 refills | Status: AC
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
## 2023-02-13 NOTE — Discharge Instructions (Addendum)
Warm salt water gargle Tylenol or ibuprofen as needed for pain and/or fever Your strep test is positive Please take antibiotics as directed.  Please make sure you complete the course of antibiotics. If you have worsening symptoms please return to urgent care to be reevaluated.

## 2023-02-13 NOTE — ED Triage Notes (Signed)
Pt c/o sore throat, and cough. Started last night. Denies fever. She states she has nasal congestion but that is normal for her to have all the time.

## 2023-04-26 ENCOUNTER — Ambulatory Visit
Admission: EM | Admit: 2023-04-26 | Discharge: 2023-04-26 | Disposition: A | Discharge status: Home / Self Care | Payer: 59 | Attending: Family Medicine | Admitting: Family Medicine

## 2023-04-26 ENCOUNTER — Encounter: Payer: Self-pay | Admitting: Oncology

## 2023-04-26 ENCOUNTER — Encounter: Payer: Self-pay | Admitting: Emergency Medicine

## 2023-04-26 ENCOUNTER — Ambulatory Visit (INDEPENDENT_AMBULATORY_CARE_PROVIDER_SITE_OTHER): Payer: 59

## 2023-04-26 DIAGNOSIS — R1084 Generalized abdominal pain: Secondary | ICD-10-CM

## 2023-04-26 DIAGNOSIS — K529 Noninfective gastroenteritis and colitis, unspecified: Secondary | ICD-10-CM

## 2023-04-26 LAB — CBC WITH DIFFERENTIAL/PLATELET
Abs Immature Granulocytes: 0.02 10*3/uL (ref 0.00–0.07)
Basophils Absolute: 0.1 10*3/uL (ref 0.0–0.1)
Basophils Relative: 1 %
Eosinophils Absolute: 0.1 10*3/uL (ref 0.0–0.5)
Eosinophils Relative: 1 %
HCT: 43.1 % (ref 36.0–46.0)
Hemoglobin: 14.7 g/dL (ref 12.0–15.0)
Immature Granulocytes: 0 %
Lymphocytes Relative: 24 %
Lymphs Abs: 1.3 10*3/uL (ref 0.7–4.0)
MCH: 28.4 pg (ref 26.0–34.0)
MCHC: 34.1 g/dL (ref 30.0–36.0)
MCV: 83.2 fL (ref 80.0–100.0)
Monocytes Absolute: 0.6 10*3/uL (ref 0.1–1.0)
Monocytes Relative: 11 %
Neutro Abs: 3.4 10*3/uL (ref 1.7–7.7)
Neutrophils Relative %: 63 %
Platelets: 230 10*3/uL (ref 150–400)
RBC: 5.18 MIL/uL — ABNORMAL HIGH (ref 3.87–5.11)
RDW: 12.8 % (ref 11.5–15.5)
WBC: 5.5 10*3/uL (ref 4.0–10.5)
nRBC: 0 % (ref 0.0–0.2)

## 2023-04-26 LAB — COMPREHENSIVE METABOLIC PANEL
ALT: 18 U/L (ref 0–44)
AST: 20 U/L (ref 15–41)
Albumin: 4.4 g/dL (ref 3.5–5.0)
Alkaline Phosphatase: 52 U/L (ref 38–126)
Anion gap: 6 (ref 5–15)
BUN: 9 mg/dL (ref 6–20)
CO2: 27 mmol/L (ref 22–32)
Calcium: 8.7 mg/dL — ABNORMAL LOW (ref 8.9–10.3)
Chloride: 102 mmol/L (ref 98–111)
Creatinine, Ser: 0.69 mg/dL (ref 0.44–1.00)
GFR, Estimated: >60 mL/min (ref >60)
Glucose, Bld: 94 mg/dL (ref 70–99)
Potassium: 3.8 mmol/L (ref 3.5–5.1)
Sodium: 135 mmol/L (ref 135–145)
Total Bilirubin: 0.4 mg/dL (ref 0.3–1.2)
Total Protein: 8 g/dL (ref 6.5–8.1)

## 2023-04-26 LAB — LIPASE, BLOOD: Lipase: 32 U/L (ref 11–51)

## 2023-04-26 MED ORDER — ONDANSETRON 4 MG PO TBDP: 4.0000 mg | ORAL_TABLET | Freq: Three times a day (TID) | ORAL | 0 refills | Status: DC | PRN

## 2023-04-26 MED ORDER — ONDANSETRON HCL 4 MG/2ML IJ SOLN
4.0000 mg | Freq: Once | INTRAMUSCULAR | Status: AC
  Administered 2023-04-26: 4 mg via INTRAMUSCULAR
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
## 2023-04-26 NOTE — Discharge Instructions (Addendum)
See handout on food choices for gastroenteritis. Use Loperamide for stools (more than 8 per day) to give your body a chance to flush out the cause of your symptoms.   Go to ED for red flag symptoms, including; fevers you cannot reduce with Tylenol/Motrin, severe headaches, vision changes, numbness/weakness in part of the body, lethargy, confusion, intractable vomiting, severe dehydration, chest pain, breathing difficulty, severe persistent abdominal or pelvic pain, signs of severe infection (increased redness, swelling of an area), feeling faint or passing out, dizziness, etc. You should especially go to the ED for sudden acute worsening of condition if you do not elect to go at this time.

## 2023-04-26 NOTE — ED Triage Notes (Signed)
Pt c/o abdominal pain, nausea and diarrhea x 4 days.

## 2023-04-27 ENCOUNTER — Emergency Department: Payer: 59

## 2023-04-27 ENCOUNTER — Emergency Department
Admission: EM | Admit: 2023-04-27 | Discharge: 2023-04-27 | Disposition: A | Discharge status: Home / Self Care | Payer: 59 | Attending: Emergency Medicine | Admitting: Emergency Medicine

## 2023-04-27 ENCOUNTER — Other Ambulatory Visit: Payer: Self-pay

## 2023-04-27 DIAGNOSIS — R1012 Left upper quadrant pain: Secondary | ICD-10-CM | POA: Diagnosis not present

## 2023-04-27 DIAGNOSIS — R1011 Right upper quadrant pain: Secondary | ICD-10-CM | POA: Diagnosis not present

## 2023-04-27 DIAGNOSIS — R197 Diarrhea, unspecified: Secondary | ICD-10-CM | POA: Diagnosis not present

## 2023-04-27 DIAGNOSIS — R112 Nausea with vomiting, unspecified: Secondary | ICD-10-CM | POA: Diagnosis present

## 2023-04-27 DIAGNOSIS — R1033 Periumbilical pain: Secondary | ICD-10-CM | POA: Diagnosis not present

## 2023-04-27 DIAGNOSIS — R109 Unspecified abdominal pain: Secondary | ICD-10-CM

## 2023-04-27 DIAGNOSIS — R1013 Epigastric pain: Secondary | ICD-10-CM | POA: Diagnosis not present

## 2023-04-27 DIAGNOSIS — R102 Pelvic and perineal pain: Secondary | ICD-10-CM

## 2023-04-27 LAB — COMPREHENSIVE METABOLIC PANEL
ALT: 20 U/L (ref 0–44)
AST: 22 U/L (ref 15–41)
Albumin: 4.3 g/dL (ref 3.5–5.0)
Alkaline Phosphatase: 48 U/L (ref 38–126)
Anion gap: 7 (ref 5–15)
BUN: 9 mg/dL (ref 6–20)
CO2: 24 mmol/L (ref 22–32)
Calcium: 8.7 mg/dL — ABNORMAL LOW (ref 8.9–10.3)
Chloride: 107 mmol/L (ref 98–111)
Creatinine, Ser: 0.72 mg/dL (ref 0.44–1.00)
GFR, Estimated: >60 mL/min (ref >60)
Glucose, Bld: 90 mg/dL (ref 70–99)
Potassium: 3.4 mmol/L — ABNORMAL LOW (ref 3.5–5.1)
Sodium: 138 mmol/L (ref 135–145)
Total Bilirubin: 0.5 mg/dL (ref 0.3–1.2)
Total Protein: 7.7 g/dL (ref 6.5–8.1)

## 2023-04-27 LAB — URINALYSIS, ROUTINE W REFLEX MICROSCOPIC
Bilirubin Urine: NEGATIVE
Glucose, UA: NEGATIVE mg/dL
Hgb urine dipstick: NEGATIVE
Ketones, ur: 20 mg/dL — AB
Nitrite: NEGATIVE
Protein, ur: NEGATIVE mg/dL
Specific Gravity, Urine: 1.018 (ref 1.005–1.030)
pH: 7 (ref 5.0–8.0)

## 2023-04-27 LAB — LIPASE, BLOOD: Lipase: 25 U/L (ref 11–51)

## 2023-04-27 LAB — CBC
HCT: 45.9 % (ref 36.0–46.0)
Hemoglobin: 15.4 g/dL — ABNORMAL HIGH (ref 12.0–15.0)
MCH: 28.2 pg (ref 26.0–34.0)
MCHC: 33.6 g/dL (ref 30.0–36.0)
MCV: 84.1 fL (ref 80.0–100.0)
Platelets: 233 10*3/uL (ref 150–400)
RBC: 5.46 MIL/uL — ABNORMAL HIGH (ref 3.87–5.11)
RDW: 12.6 % (ref 11.5–15.5)
WBC: 5.2 10*3/uL (ref 4.0–10.5)
nRBC: 0 % (ref 0.0–0.2)

## 2023-04-27 LAB — POC URINE PREG, ED: Preg Test, Ur: NEGATIVE

## 2023-04-27 MED ORDER — MORPHINE SULFATE (PF) 4 MG/ML IV SOLN
4.0000 mg | Freq: Once | INTRAVENOUS | Status: AC
  Administered 2023-04-27: 4 mg via INTRAVENOUS
  Filled 2023-04-27: qty 1

## 2023-04-27 MED ORDER — IOHEXOL 300 MG/ML  SOLN
100.0000 mL | Freq: Once | INTRAMUSCULAR | Status: AC | PRN
  Administered 2023-04-27: 100 mL via INTRAVENOUS

## 2023-04-27 MED ORDER — ONDANSETRON 4 MG PO TBDP: 4.0000 mg | ORAL_TABLET | Freq: Four times a day (QID) | ORAL | 0 refills | Status: DC | PRN

## 2023-04-27 MED ORDER — SODIUM CHLORIDE 0.9 % IV BOLUS
1000.0000 mL | Freq: Once | INTRAVENOUS | Status: AC
  Administered 2023-04-27: 1000 mL via INTRAVENOUS

## 2023-04-27 MED ORDER — ONDANSETRON HCL 4 MG/2ML IJ SOLN
4.0000 mg | INTRAMUSCULAR | Status: AC
  Administered 2023-04-27: 4 mg via INTRAVENOUS
  Filled 2023-04-27: qty 2

## 2023-04-27 NOTE — ED Triage Notes (Signed)
Pt to ED for abd pain for 5 days. Last BM this morning. Was seen at [facility] yesterday for same and given zofran.  Gallbladder removed one year ago.

## 2023-04-27 NOTE — Discharge Instructions (Addendum)
? ?  Please return to the emergency room right away if you are to develop a fever, severe nausea, your pain becomes severe or worsens, you are unable to keep food down, begin vomiting any dark or bloody fluid, you develop any dark or bloody stools, feel dehydrated, or other new concerns or symptoms arise. ? ?

## 2023-04-27 NOTE — ED Provider Notes (Signed)
[facility] Provider Note    Event Date/Time   First MD Initiated Contact with Patient 04/27/23 [phone]     (approximate)   History   Abdominal Pain   HPI  Sarah Gomez is a 27 y.o. female past medical history of cholecystectomy  Patient reports for approximately 5 days now she has been experiencing nausea vomiting diarrhea and a mid upper abdominal pain.  She reports the pain comes and goes and is sharp at times.  It is located in her upper abdomen just above the bellybutton.  Along with that she is experiencing fairly unremitting nausea, loose watery nonbloody stools, and vomiting of the fluid nonbloody  She does report that both of her children had a similar illness about a week ago, but there is only lasted a day or 2 and she feels like she has been sick for 5 days.  She is able to still keep some fluids down and drinking water okay.  No vaginal discharge no vaginal bleeding denies pregnancy, and no lower abdominal or pelvic pain.   Reviewed urgent care note patient diagnosed with possible gastroenteritis or constipation yesterday.  At that time she had reassuring laboratory studies including CBC and metabolic panel.  Physical Exam   Triage Vital Signs: ED Triage Vitals  Enc Vitals Group     BP 04/27/23 0846 (!) 127/114     Pulse Rate 04/27/23 0846 84     Resp 04/27/23 0846 20     Temp 04/27/23 0846 98 F (36.7 C)     Temp src --      SpO2 04/27/23 0846 97 %     Weight 04/27/23 0845 210 lb (95.3 kg)     Height 04/27/23 0845 5\' 5"  (1.651 m)     Head Circumference --      Peak Flow --      Pain Score 04/27/23 0845 8     Pain Loc --      Pain Edu? --      Excl. in GC? --     Most recent vital signs: Vitals:   04/27/23 1200 04/27/23 1350  BP: 110/76   Pulse: 67   Resp:    Temp:    SpO2: 99% 100%     General: Awake, no distress.  Fully alert and oriented.  Mucous membranes are moist she is not actively vomiting. CV:  Good peripheral  perfusion.  Normal tones and rate.   Resp:  Normal effort.  Clear bilaterally Abd:  No distention.  Soft mildly tender primarily across the epigastrium periumbilically and a little bit into the right upper and left upper quadrants.  There is no pain in the suprapubic or lower quadrants including McBurney's point.  There is no guarding or peritonitis.  Tenderness appears to be mild limited primarily to the upper abdomen Other:  Warm well-perfused extremities   ED Results / Procedures / Treatments   Labs (all labs ordered are listed, but only abnormal results are displayed) Labs Reviewed  COMPREHENSIVE METABOLIC PANEL - Abnormal; Notable for the following components:      Result Value   Potassium 3.4 (*)    Calcium 8.7 (*)    All other components within normal limits  CBC - Abnormal; Notable for the following components:   RBC 5.46 (*)    Hemoglobin 15.4 (*)    All other components within normal limits  URINALYSIS, ROUTINE W REFLEX MICROSCOPIC - Abnormal; Notable for the following components:   Color,  Urine YELLOW (*)    APPearance HAZY (*)    Ketones, ur 20 (*)    Leukocytes,Ua SMALL (*)    Bacteria, UA RARE (*)    All other components within normal limits  URINE CULTURE  LIPASE, BLOOD  POC URINE PREG, ED     RADIOLOGY  US PELVIC COMPLETE W TRANSVAGINAL AND TORSION R/O  Result Date: 04/27/2023 CLINICAL DATA:  Abdominal cramping and pelvic pain EXAM: TRANSABDOMINAL AND TRANSVAGINAL ULTRASOUND OF PELVIS DOPPLER ULTRASOUND OF OVARIES TECHNIQUE: Both transabdominal and transvaginal ultrasound examinations of the pelvis were performed. Transabdominal technique was performed for global imaging of the pelvis including uterus, ovaries, adnexal regions, and pelvic cul-de-sac. It was necessary to proceed with endovaginal exam following the transabdominal exam to visualize the endometrium and ovaries. Color and duplex Doppler ultrasound was utilized to evaluate blood flow to the ovaries.  COMPARISON:  None Available. FINDINGS: Uterus Measurements: 7.3 x 3.3 x 4.6 cm = volume: 58.9 mL. No fibroids or other mass visualized. Endometrium Thickness: 2 mm. Punctate endometrial calcification in the fundal region. Trace amount of fluid in the cervical canal. Right ovary Measurements: 3.7 x 2.4 x 2.5 cm = volume: 11.6 mL. Normal appearance/no adnexal mass. Left ovary Measurements: 3.7 x 2.3 x 3.5 = volume: 15.6 mL. Normal appearance/no adnexal mass. Dominant follicle or cyst measuring up to 1.9 cm Pulsed Doppler evaluation of both ovaries demonstrates normal low-resistance arterial and venous waveforms. Other findings No abnormal free fluid. IMPRESSION: 1. No evidence of ovarian torsion. 2. Trace amount of fluid in the cervical canal. Electronically Signed   By: Larose Hires D.O.   On: 04/27/2023 13:28   CT ABDOMEN PELVIS W CONTRAST  Result Date: 04/27/2023 CLINICAL DATA:  Epigastric and umbilical pain with nausea, vomiting and diarrhea for 3-5 days. EXAM: CT ABDOMEN AND PELVIS WITH CONTRAST TECHNIQUE: Multidetector CT imaging of the abdomen and pelvis was performed using the standard protocol following bolus administration of intravenous contrast. RADIATION DOSE REDUCTION: This exam was performed according to the departmental dose-optimization program which includes automated exposure control, adjustment of the mA and/or kV according to patient size and/or use of iterative reconstruction technique. CONTRAST:  OMNIPAQUE IOHEXOL 300 MG/ML  SOLN COMPARISON:  Abdominal radiographs 04/26/2023. Abdominopelvic CT 04/06/2011. Right upper quadrant abdominal ultrasound 05/22/2022. FINDINGS: Lower chest: Clear lung bases. No significant pleural or pericardial effusion. Hepatobiliary: The liver is normal in density without suspicious focal abnormality. Focal fat adjacent to the falciform ligament. Nonvisualization of the gallbladder, presumably due to interval cholecystectomy. No significant biliary  dilatation. Pancreas: Unremarkable. No pancreatic ductal dilatation or surrounding inflammatory changes. Spleen: Normal in size without focal abnormality. Adrenals/Urinary Tract: Both adrenal glands appear normal. No evidence of urinary tract calculus, suspicious renal lesion or hydronephrosis. The bladder appears unremarkable for its degree of distention. Stomach/Bowel: No enteric contrast administered. The stomach appears unremarkable for its degree of distension. No evidence of bowel wall thickening, distention or surrounding inflammatory change. The appendix appears normal. Vascular/Lymphatic: There are no enlarged abdominal or pelvic lymph nodes. Scattered mesenteric lymph nodes are likely reactive. No significant vascular findings. Reproductive: The uterus and ovaries appear normal. No adnexal mass. Other: Small umbilical hernia containing only fat. No ascites, focal extraluminal fluid collection or pneumoperitoneum. Musculoskeletal: No acute or significant osseous findings. Chronic appearing Schmorl's node formation in the superior endplate of T11. IMPRESSION: 1. No acute findings or explanation for the patient's symptoms. The appendix appears normal. 2. Nonvisualization of the gallbladder, presumably due to interval cholecystectomy. No  significant biliary dilatation. 3. Small umbilical hernia containing only fat. Electronically Signed   By: Carey Bullocks M.D.   On: 04/27/2023 10:19   DG Abd 2 Views  Result Date: 04/26/2023 CLINICAL DATA:  Abdominal pain with nausea and diarrhea x4 days. EXAM: ABDOMEN - 2 VIEW COMPARISON:  None Available. FINDINGS: The bowel gas pattern is normal. A minimal stool burden is noted. There is no evidence of free air. No radio-opaque calculi or other significant radiographic abnormality is seen. IMPRESSION: Negative. Electronically Signed   By: Aram Candela M.D.   On: 04/26/2023 19:42      PROCEDURES:  Critical Care performed: No  Procedures   MEDICATIONS  ORDERED IN ED: Medications  morphine (PF) 4 MG/ML injection 4 mg (4 mg Intravenous Given 04/27/23 0920)  ondansetron (ZOFRAN) injection 4 mg (4 mg Intravenous Given 04/27/23 0919)  sodium chloride 0.9 % bolus 1,000 mL (0 mLs Intravenous Stopped 04/27/23 1142)  iohexol (OMNIPAQUE) 300 MG/ML solution 100 mL (100 mLs Intravenous Contrast Given 04/27/23 0939)     IMPRESSION / MDM / ASSESSMENT AND PLAN / ED COURSE  I reviewed the triage vital signs and the nursing notes.                              Differential diagnosis includes, but is not limited to, possible enteritis/gastroenteritis like picture given the recent illness of her children somewhat similar nature, though versus fairly unremitting and now reporting fairly steady nausea with frequent vomiting loose watery stools and periumbilical pain.  She was seen in urgent care and reports symptoms have not improved and seem be worsening.  Based on this I think it is ideal to proceed further evaluate for other causes such as appendicitis, pancreatitis, biliary ductal dilatation, diverticulitis, etc.  She denies any symptoms suggestive of gynecologic causation.  Pregnancy test is negative  Discussed with patient we will proceed by providing hydration, antiemetic pain relief, and CT imaging to further evaluate for internal acute abnormality.  Patient's presentation is most consistent with acute complicated illness / injury requiring diagnostic workup.   ----------------------------------------- 2:30 PM on 04/27/2023 ----------------------------------------- Patient feeling improved.  Has prescription for Zofran, but will give additional prescription to provide additional medication if needed sent to her pharmacy.  Patient currently ambulatory in no distress.  Plan to follow-up with gastroenterology, I placed a referral but also given her contact info and she will call to set up an appointment.  Though I suspect she likely has self-limited acute  gastrointestinal illness she also reports a history of significant chronicity of intermittent abdominal pain cramps and discomfort after cholecystectomy.  I believe she would benefit from a follow-up with GI.  Return precautions and treatment recommendations and follow-up discussed with the patient who is agreeable with the plan.        FINAL CLINICAL IMPRESSION(S) / ED DIAGNOSES   Final diagnoses:  Nausea vomiting and diarrhea     Rx / DC Orders   ED Discharge Orders          Ordered    ondansetron (ZOFRAN-ODT) 4 MG disintegrating tablet  Every 6 hours PRN        04/27/23 1429    Ambulatory referral to Gastroenterology        04/27/23 1429             Note:  This document was prepared using Dragon voice recognition software and may include unintentional dictation errors.  Sharyn Creamer, MD 04/27/23 1431

## 2023-04-27 NOTE — ED Notes (Signed)
Pt to CT

## 2023-04-28 LAB — URINE CULTURE

## 2023-05-10 ENCOUNTER — Encounter: Payer: Self-pay | Admitting: Oncology

## 2023-05-17 NOTE — Progress Notes (Signed)
[name], PA-C [address]  [address]  [place], [place] [address]  Main: [phone]  Fax: [phone]   Gastroenterology Consultation  Referring Provider:     Sharyn Creamer, MD Primary Care Physician:  Sarah Gomez, Sarah Gomez Primary Gastroenterologist:  [name], PA-C  Reason for Consultation:     Abdominal pain, constipation, rectal bleeding.        HPI:   Sarah Gomez is a 27 y.o. y/o female referred for consultation & management  by Sarah Gomez, Sarah Gomez.    Laparoscopic cholecystectomy 05/2022.  Multiple ED visits since January 2024 for nausea, vomiting, gastroenteritis, abdominal pain and cramping.  Sarah Gomez states she has had chronic constipation since she was a child.  She reports persistent generalized lower abdominal cramping for many months.  Denies unintentional weight loss.  Has tried multiple OTC treatments including milk of magnesia, MiraLAX, Ex-Lax, with little benefit.  She has never tried prescription medicine.  She had an episode of rectal bleeding 2 or 3 months ago.  She states she has family history of colon cancer in 2 great grandparents and grandmother.  Sarah Gomez has never had a colonoscopy or GI evaluation.  Labs 04/27/2023 showed hypokalemia with potassium 3.4, otherwise normal CMP.  Normal LFTs.  Normal CBC with white count 5.2, hemoglobin 15.4.  Sarah anemia.  Normal lipase.  Negative pregnancy test.  CT abdomen pelvis with contrast 04/27/2023 showed Sarah acute abnormality.  Small umbilical hernia containing fat.  Previous cholecystectomy.  Past Medical History:  Diagnosis Date   ADHD    Anxiety    Depression    Epilepsy (HCC)    Iron deficiency anemia 12/02/2019   Migraine    Mood disorder (HCC)    Seizures (HCC)     Past Surgical History:  Procedure Laterality Date   CHOLECYSTECTOMY  04/03/2022   Sarah PAST SURGERIES      Prior to Admission medications   Medication Sig Start Date End Date Taking? Authorizing Provider  ondansetron  (ZOFRAN-ODT) 4 MG disintegrating tablet Take 1 tablet (4 mg total) by mouth every 6 (six) hours as needed for nausea or vomiting. 04/27/23   Sarah Creamer, MD  norgestimate-ethinyl estradiol (ORTHO-CYCLEN) 0.25-35 MG-MCG tablet Take 1 tablet by mouth daily. 02/03/20 08/29/20  Tresea Mall, CNM  promethazine (PHENERGAN) 12.5 MG tablet Take 1 tablet (12.5 mg total) by mouth every 6 (six) hours as needed for nausea or vomiting. 06/13/19 07/30/19  Jene Every, MD  sertraline (ZOLOFT) 25 MG tablet Take 1 tablet (25 mg total) by mouth daily. 02/03/20 08/29/20  Tresea Mall, CNM    Family History  Problem Relation Age of Onset   Multiple sclerosis Mother    Hypertension Father    Heart Problems Sister      Social History   Tobacco Use   Smoking status: Former   Smokeless tobacco: Never  Building services engineer Use: Every day  Substance Use Topics   Alcohol use: Not Currently   Drug use: Not Currently    Types: Marijuana    Allergies as of 05/18/2023   (Sarah Known Allergies)    Review of Systems:    All systems reviewed and negative except where noted in HPI.   Physical Exam:  BP 114/77   Pulse 75   Temp 97.9 F (36.6 C) (Oral)   Ht 5\' 5"  (1.651 m)   Wt 198 lb 3.2 oz (89.9 kg)   LMP 04/05/2023 (Approximate) Comment: neg preg test  BMI 32.98  kg/m  Sarah Gomez's last menstrual period was 04/05/2023 (approximate). Psych:  Alert and cooperative. Normal mood and affect. General:   Alert,  Well-developed, well-nourished, pleasant and cooperative in NAD Head:  Normocephalic and atraumatic. Eyes:  Sclera clear, Sarah icterus.   Conjunctiva pink. Neck:  Supple; Sarah masses or thyromegaly. Lungs:  Respirations even and unlabored.  Clear throughout to auscultation.   Sarah wheezes, crackles, or rhonchi. Sarah acute distress. Heart:  Regular rate and rhythm; Sarah murmurs, clicks, rubs, or gallops. Abdomen:  Normal bowel sounds.  Sarah bruits.  Soft, and non-distended without masses, hepatosplenomegaly or hernias  noted.  Moderate bilateral lower abdominal tenderness.  Sarah upper abdominal tenderness.  Sarah guarding or rebound tenderness.    Neurologic:  Alert and oriented x3;  grossly normal neurologically. Psych:  Alert and cooperative. Normal mood and affect.  Imaging Studies: US PELVIC COMPLETE W TRANSVAGINAL AND TORSION R/O  Result Date: 04/27/2023 CLINICAL DATA:  Abdominal cramping and pelvic pain EXAM: TRANSABDOMINAL AND TRANSVAGINAL ULTRASOUND OF PELVIS DOPPLER ULTRASOUND OF OVARIES TECHNIQUE: Both transabdominal and transvaginal ultrasound examinations of the pelvis were performed. Transabdominal technique was performed for global imaging of the pelvis including uterus, ovaries, adnexal regions, and pelvic cul-de-sac. It was necessary to proceed with endovaginal exam following the transabdominal exam to visualize the endometrium and ovaries. Color and duplex Doppler ultrasound was utilized to evaluate blood flow to the ovaries. COMPARISON:  None Available. FINDINGS: Uterus Measurements: 7.3 x 3.3 x 4.6 cm = volume: 58.9 mL. Sarah fibroids or other mass visualized. Endometrium Thickness: 2 mm. Punctate endometrial calcification in the fundal region. Trace amount of fluid in the cervical canal. Right ovary Measurements: 3.7 x 2.4 x 2.5 cm = volume: 11.6 mL. Normal appearance/Sarah adnexal mass. Left ovary Measurements: 3.7 x 2.3 x 3.5 = volume: 15.6 mL. Normal appearance/Sarah adnexal mass. Dominant follicle or cyst measuring up to 1.9 cm Pulsed Doppler evaluation of both ovaries demonstrates normal low-resistance arterial and venous waveforms. Other findings Sarah abnormal free fluid. IMPRESSION: 1. Sarah evidence of ovarian torsion. 2. Trace amount of fluid in the cervical canal. Electronically Signed   By: Larose Hires D.O.   On: 04/27/2023 13:28   CT ABDOMEN PELVIS W CONTRAST  Result Date: 04/27/2023 CLINICAL DATA:  Epigastric and umbilical pain with nausea, vomiting and diarrhea for 3-5 days. EXAM: CT ABDOMEN AND PELVIS  WITH CONTRAST TECHNIQUE: Multidetector CT imaging of the abdomen and pelvis was performed using the standard protocol following bolus administration of intravenous contrast. RADIATION DOSE REDUCTION: This exam was performed according to the departmental dose-optimization program which includes automated exposure control, adjustment of the mA and/or kV according to Sarah Gomez size and/or use of iterative reconstruction technique. CONTRAST:  OMNIPAQUE IOHEXOL 300 MG/ML  SOLN COMPARISON:  Abdominal radiographs 04/26/2023. Abdominopelvic CT 04/06/2011. Right upper quadrant abdominal ultrasound 05/22/2022. FINDINGS: Lower chest: Clear lung bases. Sarah significant pleural or pericardial effusion. Hepatobiliary: The liver is normal in density without suspicious focal abnormality. Focal fat adjacent to the falciform ligament. Nonvisualization of the gallbladder, presumably due to interval cholecystectomy. Sarah significant biliary dilatation. Pancreas: Unremarkable. Sarah pancreatic ductal dilatation or surrounding inflammatory changes. Spleen: Normal in size without focal abnormality. Adrenals/Urinary Tract: Both adrenal glands appear normal. Sarah evidence of urinary tract calculus, suspicious renal lesion or hydronephrosis. The bladder appears unremarkable for its degree of distention. Stomach/Bowel: Sarah enteric contrast administered. The stomach appears unremarkable for its degree of distension. Sarah evidence of bowel wall thickening, distention or surrounding inflammatory change. The appendix appears normal.  Vascular/Lymphatic: There are Sarah enlarged abdominal or pelvic lymph nodes. Scattered mesenteric lymph nodes are likely reactive. Sarah significant vascular findings. Reproductive: The uterus and ovaries appear normal. Sarah adnexal mass. Other: Small umbilical hernia containing only fat. Sarah ascites, focal extraluminal fluid collection or pneumoperitoneum. Musculoskeletal: Sarah acute or significant osseous findings. Chronic appearing  Schmorl's node formation in the superior endplate of T11. IMPRESSION: 1. Sarah acute findings or explanation for the Sarah Gomez's symptoms. The appendix appears normal. 2. Nonvisualization of the gallbladder, presumably due to interval cholecystectomy. Sarah significant biliary dilatation. 3. Small umbilical hernia containing only fat. Electronically Signed   By: Carey Bullocks M.D.   On: 04/27/2023 10:19   DG Abd 2 Views  Result Date: 04/26/2023 CLINICAL DATA:  Abdominal pain with nausea and diarrhea x4 days. EXAM: ABDOMEN - 2 VIEW COMPARISON:  None Available. FINDINGS: The bowel gas pattern is normal. A minimal stool burden is noted. There is Sarah evidence of free air. Sarah radio-opaque calculi or other significant radiographic abnormality is seen. IMPRESSION: Negative. Electronically Signed   By: Aram Candela M.D.   On: 04/26/2023 19:42    Assessment and Plan:   MAILIN PRESSMAN is a 27 y.o. y/o female has been referred for:  Rectal bleeding  Scheduling Colonoscopy I discussed risks of colonoscopy with Sarah Gomez to include risk of bleeding, colon perforation, and risk of sedation.  Sarah Gomez expressed understanding and agrees to proceed with colonoscopy.   Chronic constipation  Recommend High Fiber diet with fruits, vegetables, and whole grains. Drink 64 ounces of Fluids Daily. Start Trulance 3mg  1 tab once daily - Samples given.  Call back for Rx if needed. If needed, Add Miralax, Mix 1 capful in a drink daily.   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  4.  Hypokalemia, very mild  I instructed Sarah Gomez to consume potassium rich foods such as bananas, orange juice, and tomato daily.    Follow up in 4 weeks after colonoscopy.  [name], PA-C

## 2023-05-18 ENCOUNTER — Other Ambulatory Visit: Payer: Self-pay

## 2023-05-18 ENCOUNTER — Encounter: Payer: Self-pay | Admitting: Physician Assistant

## 2023-05-18 ENCOUNTER — Ambulatory Visit (INDEPENDENT_AMBULATORY_CARE_PROVIDER_SITE_OTHER): Payer: 59 | Admitting: Physician Assistant

## 2023-05-18 VITALS — BP 114/77 | HR 75 | Temp 97.9°F | Ht 65.0 in | Wt 198.2 lb

## 2023-05-18 DIAGNOSIS — K625 Hemorrhage of anus and rectum: Secondary | ICD-10-CM

## 2023-05-18 DIAGNOSIS — R1084 Generalized abdominal pain: Secondary | ICD-10-CM

## 2023-05-18 DIAGNOSIS — K5904 Chronic idiopathic constipation: Secondary | ICD-10-CM

## 2023-05-18 MED ORDER — TRULANCE 3 MG PO TABS: 1.0000 | ORAL_TABLET | Freq: Every day | ORAL | 0 refills | Status: DC

## 2023-05-18 MED ORDER — PEG 3350-KCL-NA BICARB-NACL 420 G PO SOLR: ORAL | 0 refills | Status: DC

## 2023-05-18 NOTE — Patient Instructions (Signed)

## 2023-05-21 ENCOUNTER — Encounter: Payer: Self-pay | Admitting: Oncology

## 2023-07-04 ENCOUNTER — Ambulatory Visit
Admission: RE | Admit: 2023-07-04 | Discharge: 2023-07-04 | Disposition: A | Discharge status: Home / Self Care | Payer: 59 | Source: Ambulatory Visit | Attending: Gastroenterology | Admitting: Gastroenterology

## 2023-07-04 ENCOUNTER — Ambulatory Visit: Payer: 59 | Admitting: Certified Registered"

## 2023-07-04 ENCOUNTER — Encounter: Payer: Self-pay | Admitting: Gastroenterology

## 2023-07-04 ENCOUNTER — Other Ambulatory Visit: Payer: Self-pay

## 2023-07-04 ENCOUNTER — Encounter
Admission: RE | Disposition: A | Discharge status: Home / Self Care | Payer: Self-pay | Source: Ambulatory Visit | Attending: Gastroenterology

## 2023-07-04 DIAGNOSIS — R1084 Generalized abdominal pain: Secondary | ICD-10-CM

## 2023-07-04 DIAGNOSIS — D126 Benign neoplasm of colon, unspecified: Secondary | ICD-10-CM | POA: Diagnosis not present

## 2023-07-04 DIAGNOSIS — D125 Benign neoplasm of sigmoid colon: Secondary | ICD-10-CM | POA: Diagnosis not present

## 2023-07-04 DIAGNOSIS — Z87891 Personal history of nicotine dependence: Secondary | ICD-10-CM | POA: Insufficient documentation

## 2023-07-04 DIAGNOSIS — K625 Hemorrhage of anus and rectum: Secondary | ICD-10-CM

## 2023-07-04 HISTORY — PX: POLYPECTOMY: SHX5525

## 2023-07-04 HISTORY — PX: COLONOSCOPY WITH PROPOFOL: SHX5780

## 2023-07-04 SURGERY — COLONOSCOPY WITH PROPOFOL
Anesthesia: General

## 2023-07-04 MED ORDER — DEXMEDETOMIDINE HCL IN NACL 80 MCG/20ML IV SOLN
INTRAVENOUS | Status: DC | PRN
  Administered 2023-07-04: 12 ug via INTRAVENOUS

## 2023-07-04 MED ORDER — PROPOFOL 10 MG/ML IV BOLUS
INTRAVENOUS | Status: AC
  Filled 2023-07-04: qty 20

## 2023-07-04 MED ORDER — STERILE WATER FOR IRRIGATION IR SOLN
Status: DC | PRN
  Administered 2023-07-04: 100 mL

## 2023-07-04 MED ORDER — LIDOCAINE HCL (CARDIAC) PF 100 MG/5ML IV SOSY
PREFILLED_SYRINGE | INTRAVENOUS | Status: DC | PRN
  Administered 2023-07-04: 100 mg via INTRAVENOUS

## 2023-07-04 MED ORDER — SODIUM CHLORIDE 0.9 % IV SOLN: INTRAVENOUS | Status: DC

## 2023-07-04 MED ORDER — PROPOFOL 500 MG/50ML IV EMUL
INTRAVENOUS | Status: DC | PRN
  Administered 2023-07-04 (×3): 50 mg via INTRAVENOUS

## 2023-07-04 NOTE — Anesthesia Preprocedure Evaluation (Signed)
Anesthesia Evaluation  Patient identified by MRN, date of birth, ID band Patient awake    Reviewed: Allergy & Precautions, NPO status , Patient's Chart, lab work & pertinent test results  Airway Mallampati: II  TM Distance: >3 FB Neck ROM: full    Dental  (+) Chipped, Dental Advidsory Given   Pulmonary Current SmokerPatient did not abstain from smoking., former smoker   Pulmonary exam normal        Cardiovascular negative cardio ROS Normal cardiovascular exam     Neuro/Psych  PSYCHIATRIC DISORDERS      negative neurological ROS     GI/Hepatic negative GI ROS, Neg liver ROS,,,  Endo/Other  negative endocrine ROS    Renal/GU negative Renal ROS  negative genitourinary   Musculoskeletal   Abdominal   Peds  Hematology negative hematology ROS (+)   Anesthesia Other Findings Past Medical History: No date: ADHD No date: Anxiety No date: Depression No date: Epilepsy (HCC) 12/02/2019: Iron deficiency anemia No date: Migraine No date: Mood disorder (HCC) No date: Seizures [facility])  Past Surgical History: 04/03/2022: CHOLECYSTECTOMY No date: NO PAST SURGERIES  BMI    Body Mass Index: 32.12 kg/m      Reproductive/Obstetrics negative OB ROS                             Anesthesia Physical Anesthesia Plan  ASA: 2  Anesthesia Plan: General   Post-op Pain Management: Minimal or no pain anticipated   Induction: Intravenous  PONV Risk Score and Plan: 3 and Propofol infusion, TIVA and Ondansetron  Airway Management Planned: Nasal Cannula  Additional Equipment: None  Intra-op Plan:   Post-operative Plan:   Informed Consent: I have reviewed the patients History and Physical, chart, labs and discussed the procedure including the risks, benefits and alternatives for the proposed anesthesia with the patient or authorized representative who has indicated his/her understanding and acceptance.      Dental advisory given  Plan Discussed with: CRNA and Surgeon  Anesthesia Plan Comments: (Discussed risks of anesthesia with patient, including possibility of difficulty with spontaneous ventilation under anesthesia necessitating airway intervention, PONV, and rare risks such as cardiac or respiratory or neurological events, and allergic reactions. Discussed the role of CRNA in patient's perioperative care. Patient understands.)       Anesthesia Quick Evaluation

## 2023-07-04 NOTE — Op Note (Signed)
[facility] Patient Name: Sarah Gomez Procedure Date: 07/04/2023 12:52 PM MRN: [id number] Account #: [id number] Date of Birth: [birth year] Admit Type: Outpatient Age: 27 Room: [facility] ENDO ROOM 3 Gender: Female Note Status: Finalized Instrument Name: Prentice Docker [id number] Procedure:             Colonoscopy Indications:           Rectal bleeding Providers:             Wyline Mood MD, MD Medicines:             Monitored Anesthesia Care Complications:         No immediate complications. Procedure:             Pre-Anesthesia Assessment:                        - Prior to the procedure, a History and Physical was                         performed, and patient medications, allergies and                         sensitivities were reviewed. The patient's tolerance                         of previous anesthesia was reviewed.                        - The risks and benefits of the procedure and the                         sedation options and risks were discussed with the                         patient. All questions were answered and informed                         consent was obtained.                        - ASA Grade Assessment: II - A patient with mild                         systemic disease.                        After obtaining informed consent, the colonoscope was                         passed under direct vision. Throughout the procedure,                         the patient's blood pressure, pulse, and oxygen                         saturations were monitored continuously. The                         Colonoscope was introduced through the anus and  advanced to the the cecum, identified by the                         appendiceal orifice. The colonoscopy was performed                         with ease. The patient tolerated the procedure well.                         The quality of the bowel preparation was excellent.                          The ileocecal valve, appendiceal orifice, and rectum                         were photographed. Findings:      The perianal and digital rectal examinations were normal.      A 5 mm polyp was found in the sigmoid colon. The polyp was sessile. The       polyp was removed with a cold snare. Resection and retrieval were       complete.      The exam was otherwise without abnormality on direct and retroflexion       views. Impression:            - One 5 mm polyp in the sigmoid colon, removed with a                         cold snare. Resected and retrieved.                        - The examination was otherwise normal on direct and                         retroflexion views. Recommendation:        - Discharge patient to home.                        - Resume previous diet.                        - Continue present medications.                        - Await pathology results.                        - Repeat colonoscopy for surveillance based on                         pathology results. Procedure Code(s):     --- Professional ---                        [id number], Colonoscopy, flexible; with removal of                         tumor(s), polyp(s), or other lesion(s) by snare                         technique Diagnosis Code(s):     ---  Professional ---                        D12.5, Benign neoplasm of sigmoid colon                        K62.5, Hemorrhage of anus and rectum CPT copyright 2022 American Medical Association. All rights reserved. The codes documented in this report are preliminary and upon coder review may  be revised to meet current compliance requirements. Wyline Mood, MD Wyline Mood MD, MD 07/04/2023 2:23:55 PM This report has been signed electronically. Number of Addenda: 0 Note Initiated On: 07/04/2023 12:52 PM Scope Withdrawal Time: 0 hours 6 minutes 36 seconds  Total Procedure Duration: 0 hours 10 minutes 55 seconds  Estimated Blood Loss:  Estimated blood loss:  none.      [facility]

## 2023-07-04 NOTE — Transfer of Care (Signed)
Immediate Anesthesia Transfer of Care Note  Patient: Sarah Gomez  Procedure(s) Performed: COLONOSCOPY WITH PROPOFOL POLYPECTOMY  Patient Location: PACU  Anesthesia Type:General  Level of Consciousness: drowsy  Airway & Oxygen Therapy: Patient Spontanous Breathing  Post-op Assessment: Report given to RN and Post -op Vital signs reviewed and stable  Post vital signs: stable  Last Vitals:  Vitals Value Taken Time  BP    Temp    Pulse 57 07/04/23 1425  Resp 20 07/04/23 1425  SpO2 99 % 07/04/23 1425  Vitals shown include unfiled device data.  Last Pain:  Vitals:   07/04/23 1341  TempSrc: Temporal  PainSc: 0-No pain         Complications: No notable events documented.

## 2023-07-04 NOTE — H&P (Signed)
Wyline Mood, MD [address], [place], [place], [address] [address], [address], [place], [place], [address] Phone: [phone]  Fax: [phone]  Primary Care Physician:  [facility]   Pre-Procedure History & Physical: HPI:  JOYCELENE GIACOMELLI is a 27 y.o. female is here for an colonoscopy.   Past Medical History:  Diagnosis Date   ADHD    Anxiety    Depression    Epilepsy (HCC)    Iron deficiency anemia 12/02/2019   Migraine    Mood disorder (HCC)    Seizures (HCC)     Past Surgical History:  Procedure Laterality Date   CHOLECYSTECTOMY  04/03/2022   NO PAST SURGERIES      Prior to Admission medications   Medication Sig Start Date End Date Taking? Authorizing Provider  Plecanatide (TRULANCE) 3 MG TABS Take 1 tablet (3 mg total) by mouth daily. 05/18/23   [name], PA-C  polyethylene glycol-electrolytes (NULYTELY) 420 g solution Prepare according to package instructions. Starting at 5:00 PM: Drink one 8 oz glass of mixture every 15 minutes until you finish half of the jug. Five hours prior to procedure, drink 8 oz glass of mixture every 15 minutes until it is all gone. Make sure you do not drink anything 4 hours prior to your procedure. 05/18/23   [name], PA-C  norgestimate-ethinyl estradiol (ORTHO-CYCLEN) 0.25-35 MG-MCG tablet Take 1 tablet by mouth daily. 02/03/20 08/29/20  Tresea Mall, CNM  promethazine (PHENERGAN) 12.5 MG tablet Take 1 tablet (12.5 mg total) by mouth every 6 (six) hours as needed for nausea or vomiting. 06/13/19 07/30/19  Jene Every, MD  sertraline (ZOLOFT) 25 MG tablet Take 1 tablet (25 mg total) by mouth daily. 02/03/20 08/29/20  Tresea Mall, CNM    Allergies as of 05/18/2023   (No Known Allergies)    Family History  Problem Relation Age of Onset   Multiple sclerosis Mother    Hypertension Father    Heart Problems Sister     Social History   Socioeconomic History   Marital status:  Single    Spouse name: Not on file   Number of children: 1   Years of education: Not on file   Highest education level: 12th grade  Occupational History   Occupation: factory   Tobacco Use   Smoking status: Former   Smokeless tobacco: Never  Advertising account planner   Vaping status: Every Day  Substance and Sexual Activity   Alcohol use: Not Currently   Drug use: Not Currently    Types: Marijuana   Sexual activity: Not Currently    Partners: Male    Birth control/protection: Implant  Other Topics Concern   Not on file  Social History Narrative   Right handed    Social Determinants of Health   Financial Resource Strain: Not on file  Food Insecurity: Not on file  Transportation Needs: Not on file  Physical Activity: Not on file  Stress: Not on file  Social Connections: Not on file  Intimate Partner Violence: Not on file    Review of Systems: See HPI, otherwise negative ROS  Physical Exam: There were no vitals taken for this visit. General:   Alert,  pleasant and cooperative in NAD Head:  Normocephalic and atraumatic. Neck:  Supple; no masses or thyromegaly. Lungs:  Clear throughout to auscultation, normal respiratory effort.    Heart:  +S1, +S2, Regular rate and rhythm, No edema. Abdomen:  Soft, nontender and nondistended.  Normal bowel sounds, without guarding, and without rebound.   Neurologic:  Alert and  oriented x4;  grossly normal neurologically.  Impression/Plan: TEMPE RAZZAK is here for an colonoscopy to be performed for rectal bleeding.  Risks, benefits, limitations, and alternatives regarding  colonoscopy have been reviewed with the patient.  Questions have been answered.  All parties agreeable.   Wyline Mood, MD  07/04/2023, 1:30 PM

## 2023-07-05 ENCOUNTER — Encounter: Payer: Self-pay | Admitting: Gastroenterology

## 2023-07-05 NOTE — Anesthesia Postprocedure Evaluation (Signed)
Anesthesia Post Note  Patient: Sarah Gomez  Procedure(s) Performed: COLONOSCOPY WITH PROPOFOL POLYPECTOMY  Patient location during evaluation: Endoscopy Anesthesia Type: General Level of consciousness: awake and alert Pain management: pain level controlled Vital Signs Assessment: post-procedure vital signs reviewed and stable Respiratory status: spontaneous breathing, nonlabored ventilation, respiratory function stable and patient connected to nasal cannula oxygen Cardiovascular status: blood pressure returned to baseline and stable Postop Assessment: no apparent nausea or vomiting Anesthetic complications: no   There were no known notable events for this encounter.   Last Vitals:  Vitals:   07/04/23 1341 07/04/23 1425  BP: 110/77 106/61  Pulse: 76   Resp: 15   Temp: (!) 36.3 C 36.8 C  SpO2: 99%     Last Pain:  Vitals:   07/04/23 1445  TempSrc:   PainSc: 0-No pain                 Lenard Simmer

## 2023-07-09 ENCOUNTER — Encounter: Payer: Self-pay | Admitting: Gastroenterology

## 2023-08-01 NOTE — Progress Notes (Signed)
[name], PA-C [address]  [address]  [place], [place] [address]  Main: [phone]  Fax: [phone]   Primary Care Physician: Sandrea Hughs, NP  Primary Gastroenterologist:  [name], PA-C / Dr. Wyline Mood    CC: F/U rectal bleeding, chronic constipation, abdominal pain  HPI: Sarah Gomez is a 27 y.o. female returns for 73-month follow-up of rectal bleeding, chronic constipation, and generalized lower abdominal pain.  At her last visit she was started on Trulance 3 mg 1 tablet once daily.  Add MiraLAX if needed.  Suspected to have IBS-C.  Patient states she is not currently taking Trulance.  She occasionally takes OTC stool softener or Senokot as needed.  Constipation has currently improved.  She is currently having bowel movement once daily.  She has not had any more episodes of rectal bleeding since her colonoscopy procedure.  Currently doing well with no GI concerns.  Colonoscopy 07/04/2023 by Dr. Tobi Bastos showed 1 small 5 mm tubular adenoma polyp removed from sigmoid colon.  Excellent prep.  No other abnormality.  No mention of hemorrhoids.  Repeat colonoscopy in 7 years.  CT abdomen pelvis with contrast 04/27/2023 showed no acute abnormality. Small umbilical hernia containing fat. Previous cholecystectomy.   Current Outpatient Medications  Medication Sig Dispense Refill   docusate sodium (COLACE) 100 MG capsule Take 100 mg by mouth 2 (two) times daily.     No current facility-administered medications for this visit.    Allergies as of 08/02/2023   (No Known Allergies)    Past Medical History:  Diagnosis Date   ADHD    Anxiety    Depression    Epilepsy (HCC)    Iron deficiency anemia 12/02/2019   Migraine    Mood disorder (HCC)    Seizures (HCC)     Past Surgical History:  Procedure Laterality Date   CHOLECYSTECTOMY  04/03/2022   COLONOSCOPY WITH PROPOFOL N/A 07/04/2023   Procedure: COLONOSCOPY WITH PROPOFOL;  Surgeon: Wyline Mood, MD;  Location:  [facility] ENDOSCOPY;  Service: Gastroenterology;  Laterality: N/A;   NO PAST SURGERIES     POLYPECTOMY  07/04/2023   Procedure: POLYPECTOMY;  Surgeon: Wyline Mood, MD;  Location: [facility] ENDOSCOPY;  Service: Gastroenterology;;    Review of Systems:    All systems reviewed and negative except where noted in HPI.   Physical Examination:   BP 116/74 (BP Location: Right Arm, Patient Position: Sitting, Cuff Size: Normal)   Pulse 76   Temp 98 F (36.7 C) (Oral)   Ht 5\' 5"  (1.651 m)   Wt 195 lb 6 oz (88.6 kg)   BMI 32.51 kg/m   General: Well-nourished, well-developed in no acute distress.  Neuro: Alert and oriented x 3.  Grossly intact.  Psych: Alert and cooperative, normal mood and affect. No exam performed.  Imaging Studies: No results found.  Assessment and Plan:   Sarah Gomez is a 27 y.o. y/o female returns for follow-up of chronic constipation and irritable bowel syndrome, constipation predominant.  Recent colonoscopy showed 1 small adenomatous polyp removed.  Repeat colonoscopy in 7 years.  Constipation has improved.  Rectal bleeding resolved.  Recent colonoscopy unrevealing.  She has no GI symptoms or concerns at present.  1.  Chronic constipation -currently improved  She is not taking Trulance.  Continue OTC stool softener or Senokot as needed.  Continue high-fiber diet and 64 ounces of fluids daily.  2.  Irritable bowel syndrome with constipation  3.  1 small adenomatous colon  polyp  Repeat colonoscopy in 7 years (due 06/2030).  [name], PA-C  Follow up as needed.

## 2023-08-02 ENCOUNTER — Encounter: Payer: Self-pay | Admitting: Physician Assistant

## 2023-08-02 ENCOUNTER — Ambulatory Visit (INDEPENDENT_AMBULATORY_CARE_PROVIDER_SITE_OTHER): Payer: 59 | Admitting: Physician Assistant

## 2023-08-02 VITALS — BP 116/74 | HR 76 | Temp 98.0°F | Ht 65.0 in | Wt 195.4 lb

## 2023-08-02 DIAGNOSIS — Z86010 Personal history of colonic polyps: Secondary | ICD-10-CM | POA: Diagnosis not present

## 2023-08-02 DIAGNOSIS — K5904 Chronic idiopathic constipation: Secondary | ICD-10-CM | POA: Diagnosis not present

## 2023-08-02 NOTE — Patient Instructions (Signed)
Your recent colonoscopy showed 1 small precancerous polyp removed.  We recommend repeat colonoscopy in 7 years which will be due 06/2030.  Recommend high-fiber diet and drink 61 ounces of fluids daily to help constipation.  You may take OTC stool softener or Senokot as needed for constipation.

## 2023-08-21 ENCOUNTER — Ambulatory Visit
Admission: EM | Admit: 2023-08-21 | Discharge: 2023-08-21 | Disposition: A | Discharge status: Home / Self Care | Payer: 59 | Attending: Emergency Medicine | Admitting: Emergency Medicine

## 2023-08-21 ENCOUNTER — Encounter: Payer: Self-pay | Admitting: Oncology

## 2023-08-21 ENCOUNTER — Encounter: Payer: Self-pay | Admitting: Emergency Medicine

## 2023-08-21 ENCOUNTER — Emergency Department
Admission: EM | Admit: 2023-08-21 | Discharge: 2023-08-21 | Disposition: A | Discharge status: Home / Self Care | Payer: 59 | Attending: Emergency Medicine | Admitting: Emergency Medicine

## 2023-08-21 ENCOUNTER — Other Ambulatory Visit: Payer: Self-pay

## 2023-08-21 DIAGNOSIS — R1084 Generalized abdominal pain: Secondary | ICD-10-CM

## 2023-08-21 DIAGNOSIS — K2970 Gastritis, unspecified, without bleeding: Secondary | ICD-10-CM | POA: Diagnosis not present

## 2023-08-21 DIAGNOSIS — R1010 Upper abdominal pain, unspecified: Secondary | ICD-10-CM | POA: Diagnosis present

## 2023-08-21 DIAGNOSIS — R1013 Epigastric pain: Secondary | ICD-10-CM

## 2023-08-21 LAB — COMPREHENSIVE METABOLIC PANEL
ALT: 19 U/L (ref 0–44)
AST: 22 U/L (ref 15–41)
Albumin: 4.1 g/dL (ref 3.5–5.0)
Alkaline Phosphatase: 45 U/L (ref 38–126)
Anion gap: 11 (ref 5–15)
BUN: 7 mg/dL (ref 6–20)
CO2: 23 mmol/L (ref 22–32)
Calcium: 9 mg/dL (ref 8.9–10.3)
Chloride: 103 mmol/L (ref 98–111)
Creatinine, Ser: 0.52 mg/dL (ref 0.44–1.00)
GFR, Estimated: >60 mL/min (ref >60)
Glucose, Bld: 89 mg/dL (ref 70–99)
Potassium: 3.4 mmol/L — ABNORMAL LOW (ref 3.5–5.1)
Sodium: 137 mmol/L (ref 135–145)
Total Bilirubin: 0.6 mg/dL (ref 0.3–1.2)
Total Protein: 7.4 g/dL (ref 6.5–8.1)

## 2023-08-21 LAB — URINALYSIS, ROUTINE W REFLEX MICROSCOPIC
Bilirubin Urine: NEGATIVE
Glucose, UA: NEGATIVE mg/dL
Hgb urine dipstick: NEGATIVE
Ketones, ur: 20 mg/dL — AB
Nitrite: NEGATIVE
Protein, ur: 30 mg/dL — AB
Specific Gravity, Urine: 1.024 (ref 1.005–1.030)
pH: 6 (ref 5.0–8.0)

## 2023-08-21 LAB — CBC WITH DIFFERENTIAL/PLATELET
Abs Immature Granulocytes: 0.02 10*3/uL (ref 0.00–0.07)
Basophils Absolute: 0 10*3/uL (ref 0.0–0.1)
Basophils Relative: 1 %
Eosinophils Absolute: 0.1 10*3/uL (ref 0.0–0.5)
Eosinophils Relative: 2 %
HCT: 42.3 % (ref 36.0–46.0)
Hemoglobin: 14.5 g/dL (ref 12.0–15.0)
Immature Granulocytes: 0 %
Lymphocytes Relative: 35 %
Lymphs Abs: 2 10*3/uL (ref 0.7–4.0)
MCH: 28.5 pg (ref 26.0–34.0)
MCHC: 34.3 g/dL (ref 30.0–36.0)
MCV: 83.3 fL (ref 80.0–100.0)
Monocytes Absolute: 0.5 10*3/uL (ref 0.1–1.0)
Monocytes Relative: 9 %
Neutro Abs: 3.1 10*3/uL (ref 1.7–7.7)
Neutrophils Relative %: 53 %
Platelets: 217 10*3/uL (ref 150–400)
RBC: 5.08 MIL/uL (ref 3.87–5.11)
RDW: 12.4 % (ref 11.5–15.5)
WBC: 5.7 10*3/uL (ref 4.0–10.5)
nRBC: 0 % (ref 0.0–0.2)

## 2023-08-21 LAB — LIPASE, BLOOD: Lipase: 24 U/L (ref 11–51)

## 2023-08-21 MED ORDER — SUCRALFATE 1 G PO TABS: 1.0000 g | ORAL_TABLET | Freq: Four times a day (QID) | ORAL | 0 refills | Status: AC

## 2023-08-21 MED ORDER — SUCRALFATE 1 G PO TABS
1.0000 g | ORAL_TABLET | Freq: Once | ORAL | Status: AC
  Administered 2023-08-21: 1 g via ORAL
  Filled 2023-08-21: qty 1

## 2023-08-21 MED ORDER — PANTOPRAZOLE SODIUM 40 MG PO TBEC
40.0000 mg | DELAYED_RELEASE_TABLET | Freq: Once | ORAL | Status: AC
  Administered 2023-08-21: 40 mg via ORAL
  Filled 2023-08-21: qty 1

## 2023-08-21 MED ORDER — PANTOPRAZOLE SODIUM 40 MG PO TBEC: 40.0000 mg | DELAYED_RELEASE_TABLET | Freq: Every day | ORAL | 1 refills | Status: DC

## 2023-08-21 MED ORDER — KETOROLAC TROMETHAMINE 30 MG/ML IJ SOLN
30.0000 mg | Freq: Once | INTRAMUSCULAR | Status: AC
  Administered 2023-08-21: 30 mg via INTRAMUSCULAR

## 2023-08-21 MED ORDER — ONDANSETRON 4 MG PO TBDP: 4.0000 mg | ORAL_TABLET | Freq: Three times a day (TID) | ORAL | 0 refills | Status: DC | PRN

## 2023-08-21 MED ORDER — HYDROCODONE-ACETAMINOPHEN 5-325 MG PO TABS
1.0000 | ORAL_TABLET | Freq: Once | ORAL | Status: AC
  Administered 2023-08-21: 1 via ORAL
  Filled 2023-08-21: qty 1

## 2023-08-21 MED ORDER — ONDANSETRON 4 MG PO TBDP
4.0000 mg | ORAL_TABLET | Freq: Once | ORAL | Status: AC
  Administered 2023-08-21: 4 mg via ORAL
  Filled 2023-08-21: qty 1

## 2023-08-21 NOTE — Discharge Instructions (Addendum)
Go immediately to the emergency department.  I am concerned that you have pancreatitis, or an obstructing gallstone causing liver damage.  I have given you Toradol 30 mg IM here x 1.  Do not have anything to eat or drink until your ER evaluation is complete.  Let them know if your pain changes, gets worse, or for other concerns.

## 2023-08-21 NOTE — ED Triage Notes (Signed)
Sx started 3 days ago  Last BM today. Greyish white color. Gallbladder removed and liver checked. This is an on going problem. Had colonoscopy a month ago where pylop   was removed.

## 2023-08-21 NOTE — ED Triage Notes (Signed)
Patient ambulatory to triage with steady gait, without difficulty or distress noted; pt reports generalized abd pain radiating into back accomp by nausea; sent over by UC for evaluation after receiving injection of toradol IM for pain

## 2023-08-21 NOTE — ED Provider Notes (Signed)
[facility] Provider Note    Event Date/Time   First MD Initiated Contact with Patient 08/21/23 2035     (approximate)  History   Chief Complaint: Abdominal Pain  HPI  Sarah Gomez is a 27 y.o. female with a past medical history of anxiety, migraines, history of abdominal discomfort, presents to the emergency department for upper abdominal discomfort and nausea.  According to the patient for the last 2 to 3 days she has been experiencing nausea as well as discomfort across the upper abdomen.  Patient was seen in urgent care and referred to the emergency department for further evaluation.  Patient describes her upper abdominal discomfort as mild currently states the Toradol helped considerably with her pain that she received urgent care.  Patient states she has been nauseated and vomited x 1.  Denies any increase in discomfort with food although states she has not been eating much.  No urinary symptoms no vaginal symptoms no lower abdominal discomfort.  Patient states over the last 3 years she has been experiencing issues with her abdomen as well as intermittent nausea and diarrhea.  Has been diagnosed with possible IBS has followed up with GI has had a colonoscopy.  Patient is 1 year status post cholecystectomy.  Denies any alcohol use.  Patient states very rare NSAID use.  Physical Exam   Triage Vital Signs: ED Triage Vitals [08/21/23 1924]  Encounter Vitals Group     BP 124/79     Systolic BP Percentile      Diastolic BP Percentile      Pulse Rate 61     Resp 18     Temp 98.1 F (36.7 C)     Temp Source Oral     SpO2 98 %     Weight 185 lb (83.9 kg)     Height 5\' 5"  (1.651 m)     Head Circumference      Peak Flow      Pain Score 5     Pain Loc      Pain Education      Exclude from Growth Chart     Most recent vital signs: Vitals:   08/21/23 1924  BP: 124/79  Pulse: 61  Resp: 18  Temp: 98.1 F (36.7 C)  SpO2: 98%    General: Awake, no  distress.  CV:  Good peripheral perfusion.  Regular rate and rhythm  Resp:  Normal effort.  Equal breath sounds bilaterally.  Abd:  No distention.  Soft, minimal epigastric tenderness otherwise benign abdomen.   ED Results / Procedures / Treatments   MEDICATIONS ORDERED IN ED: Medications - No data to display   IMPRESSION / MDM / ASSESSMENT AND PLAN / ED COURSE  I reviewed the triage vital signs and the nursing notes.  Patient's presentation is most consistent with acute presentation with potential threat to life or bodily function.  Patient presents to the emergency department for upper abdominal discomfort nausea ongoing over the last 3 days.  Overall the patient appears well, reassuring CBC with a normal white blood cell count, chemistry is reassuring with normal LFTs.  Lipase is normal.  Given the patient's normal LFTs normal lipase and the fact that she is status post cholecystectomy do not believe the patient requires further imaging.  Very minimal tenderness in the epigastrium with nausea and a history of the same in the past per patient intermittently.  Highly suspect this could be gastritis related.  Discussed with the  patient option of placing her on Protonix and sucralfate having her follow-up with her GI doctor.  Patient is agreeable to plan of care.  Vital signs are reassuring, afebrile, lab work is reassuring.  Patient's urinalysis does show some white cells but also shows the similar amount of squamous/epithelial cells likely indicating contamination and the patient has no urinary symptoms or lower abdominal discomfort.  We will treat the patient with Protonix, sucralfate have the patient follow-up with GI medicine.  Discussed return precautions.  Patient agreeable to plan of care.  FINAL CLINICAL IMPRESSION(S) / ED DIAGNOSES   Nausea Gastritis    Note:  This document was prepared using Dragon voice recognition software and may include unintentional dictation errors.    Minna Antis, MD 08/21/23 2055

## 2023-08-21 NOTE — Discharge Instructions (Addendum)
As we discussed please take your Protonix each morning for the next 2 months, take your sucralfate before breakfast, lunch, dinner and before going to bed (4 times daily) for the next 2 weeks.  You may take your nausea medication as needed as prescribed.  Please follow-up with your doctor or your GI doctor for further evaluation.  Return to the emergency department for any worsening pain, if you are unable to tolerate fluids, or any other symptom personally concerning to yourself.

## 2023-08-21 NOTE — ED Provider Notes (Signed)
HPI  SUBJECTIVE:  Sarah Gomez is a 27 y.o. female who presents with 2 days of stabbing, constant epigastric pain that radiates along her lower ribs to her back that has become more severe, intermittent and lasting over an hour today.  She has had constipation over the past 2 days, but was able to pass some white / gray stool today without any change in her pain.  She states now the pain feels like her previous gallbladder attacks that resulted in a cholecystectomy.  She reports nausea, 1 episode of nonbilious, nonbloody emesis with the pain and anorexia.  She states that the car ride over here was painful.  No fevers, coughing, wheezing, shortness of breath, abdominal distention.  No change in urine output, urinary complaints.  No antipyretic in the past 6 hours.  She tried 200 mg of ibuprofen and cold fresh air.  The exposure to cold air help.  Symptoms are worse when she stands up straight, with walking, eating greasy/fatty food and after having a bowel movement.  Eating bland foods does not change her pain.  No recent alcohol use.  Denies excess NSAID use.  Past medical history of seizures, IBS-C, migraines, chronic cholecystitis status post cholecystectomy and status post colonoscopy with polypectomy on 07/04/2023.  No history of diabetes, hypertension, peptic ulcer disease, other abdominal surgeries, atrial fibrillation, hypercoagulability, pancreatitis.  PE: In July.  She has a Nexplanon.  Denies the possibility of being pregnant.  [facility].  Seen by GI on 8/29 for chronic idiopathic constipation, rectal bleeding, generalized lower abdominal pain.  She is suspected of having IBS-C.   Past Medical History:  Diagnosis Date   ADHD    Anxiety    Depression    Epilepsy (HCC)    Iron deficiency anemia 12/02/2019   Migraine    Mood disorder (HCC)    Seizures (HCC)     Past Surgical History:  Procedure Laterality Date   CHOLECYSTECTOMY  04/03/2022   COLONOSCOPY WITH PROPOFOL  N/A 07/04/2023   Procedure: COLONOSCOPY WITH PROPOFOL;  Surgeon: Wyline Mood, MD;  Location: [facility] ENDOSCOPY;  Service: Gastroenterology;  Laterality: N/A;   NO PAST SURGERIES     POLYPECTOMY  07/04/2023   Procedure: POLYPECTOMY;  Surgeon: Wyline Mood, MD;  Location: [facility] ENDOSCOPY;  Service: Gastroenterology;;    Family History  Problem Relation Age of Onset   Multiple sclerosis Mother    Hypertension Father    Heart Problems Sister     Social History   Tobacco Use   Smoking status: Former   Smokeless tobacco: Never  Vaping Use   Vaping status: Every Day  Substance Use Topics   Alcohol use: Not Currently   Drug use: Not Currently    Types: Marijuana     Current Facility-Administered Medications:    ketorolac (TORADOL) 30 MG/ML injection 30 mg, 30 mg, Intramuscular, Once, Domenick Gong, MD  Current Outpatient Medications:    docusate sodium (COLACE) 100 MG capsule, Take 100 mg by mouth 2 (two) times daily., Disp: , Rfl:   No Known Allergies   ROS  As noted in HPI.   Physical Exam  BP (!) 111/90 (BP Location: Left Arm)   Pulse 65   Temp 98.5 F (36.9 C) (Oral)   Resp 17   Wt 89.8 kg   LMP 06/27/2023 (Approximate)   SpO2 100%   BMI 32.95 kg/m   Constitutional: Well developed, well nourished, appears uncomfortable Eyes:  EOMI, conjunctiva normal bilaterally HENT: Normocephalic, atraumatic,mucus membranes  moist Respiratory: Normal inspiratory effort, lungs clear bilaterally.  Good air movement. Cardiovascular: Normal rate, regular rhythm, no murmurs rubs or gallops GI: nondistended soft.  Diffuse tenderness, maximal in the periumbilical region.  No guarding, rebound.  Negative tap table test. Back: No CVAT skin: No rash, skin intact Musculoskeletal: no deformities Neurologic: Alert & oriented x 3, no focal neuro deficits Psychiatric: Speech and behavior appropriate   ED Course   Medications  ketorolac (TORADOL) 30 MG/ML injection 30 mg (has no  administration in time range)    No orders of the defined types were placed in this encounter.   No results found for this or any previous visit (from the past 24 hour(s)). No results found.  ED Clinical Impression  1. Generalized abdominal pain      ED Assessment/Plan     Outside records reviewed.  As noted in HPI.  I am concerned that the patient has an obstructing gallstone causing acholic stools and/or pancreatitis.  Unfortunately, we do not have advanced imaging here today.  Giving patient Toradol 30 mg IM x 1.  Advised her to be n.p.o. until ER evaluation is complete.  She declined antiemetics.  Transferring to the emergency department via private vehicle for comprehensive evaluation, IV fluids and pain control.  She is stable to go by private vehicle.  Discussed rationale for transfer to the emergency department with the patient.  She agrees to go.   Meds ordered this encounter  Medications   ketorolac (TORADOL) 30 MG/ML injection 30 mg      *This clinic note was created using Scientist, clinical (histocompatibility and immunogenetics). Therefore, there may be occasional mistakes despite careful proofreading.  ?    Domenick Gong, MD 08/21/23 1758

## 2023-08-24 IMAGING — US US OB COMP +14 WK
2 series · 15 of 28 positions shown · non-contrast
Comparison: none

CLINICAL DATA: Fetal anatomic survey

EXAM:
OBSTETRICAL ULTRASOUND >14 WKS

[Series 1: us ob comp + 14 wk · 14 of 66 slices shown (1 of 2)]
[im 1/66]
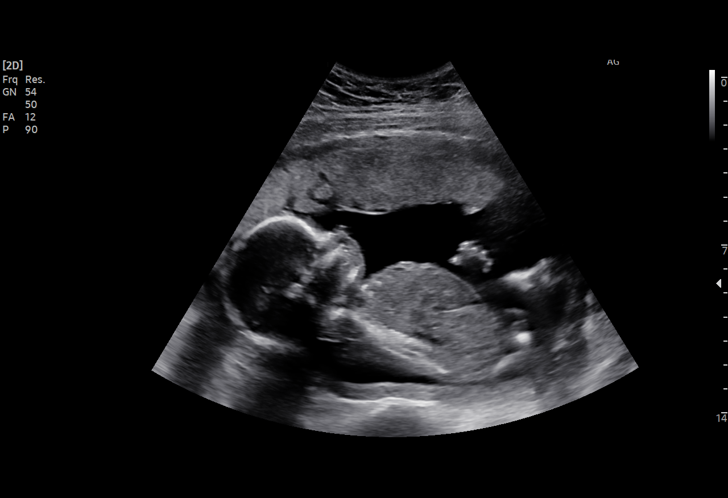
[im 6/66]
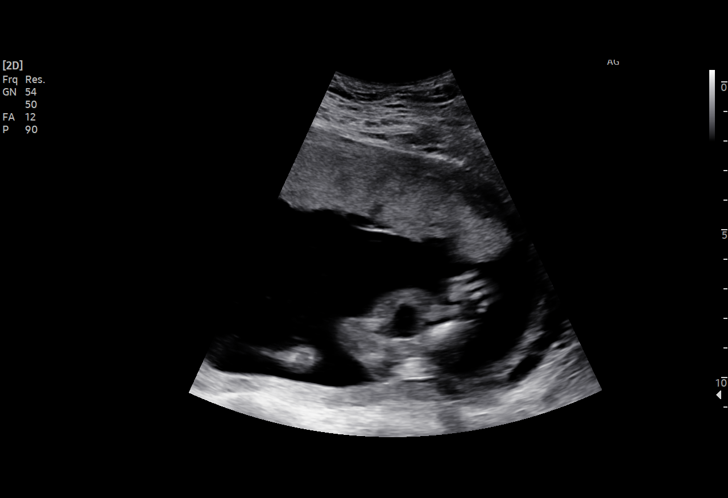
[im 11/66]
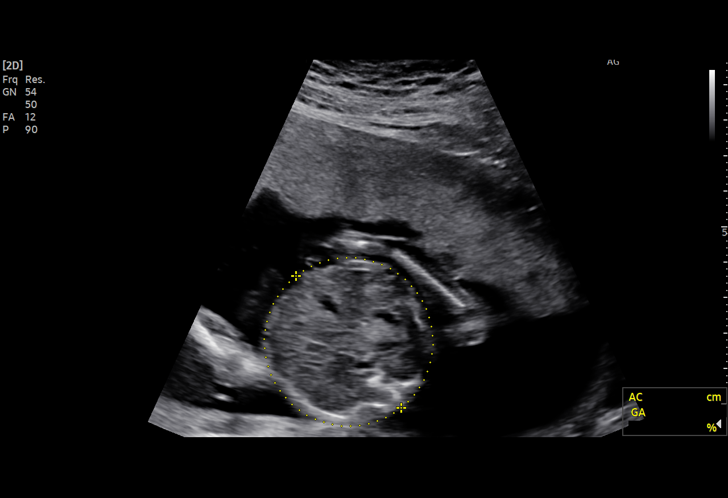
[im 16/66]
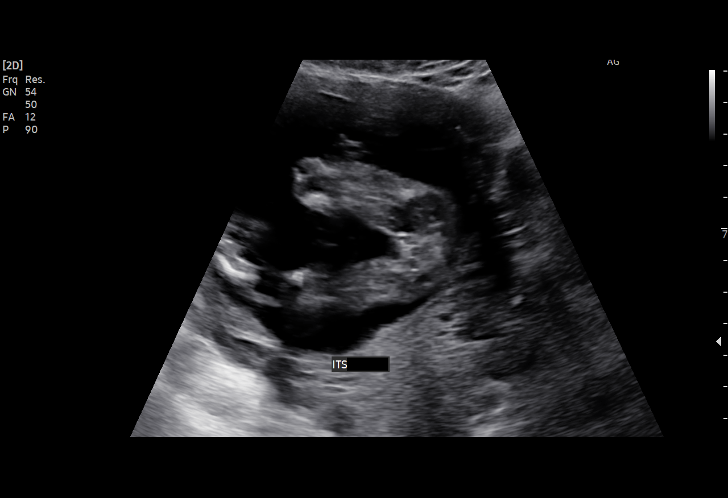
[im 21/66]
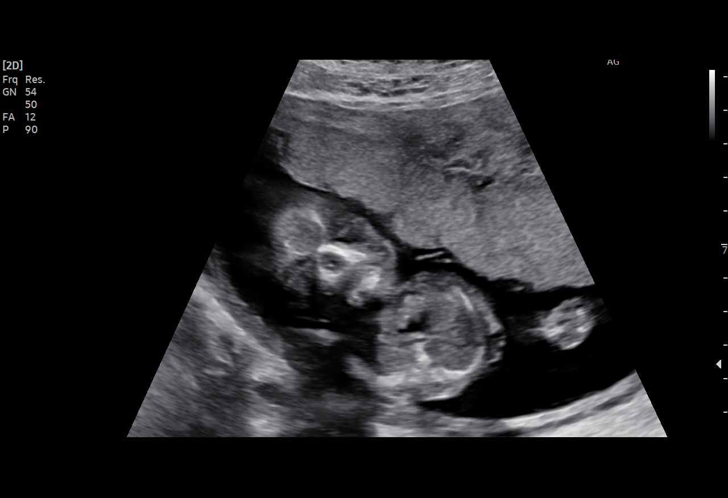
[im 26/66]
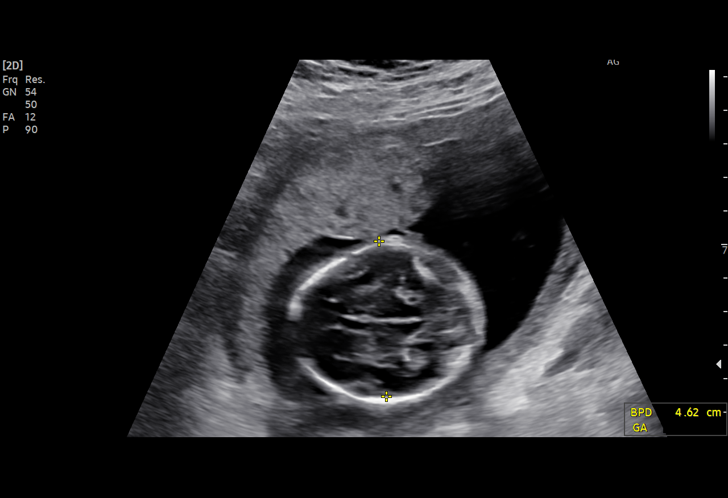
[im 31/66]
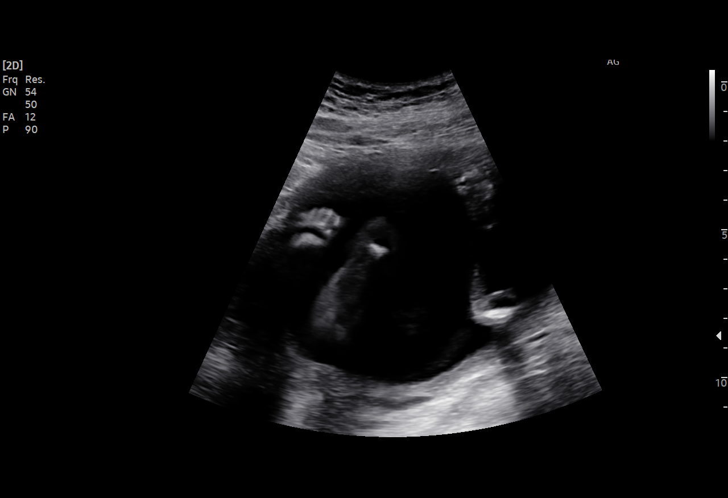
[im 36/66]
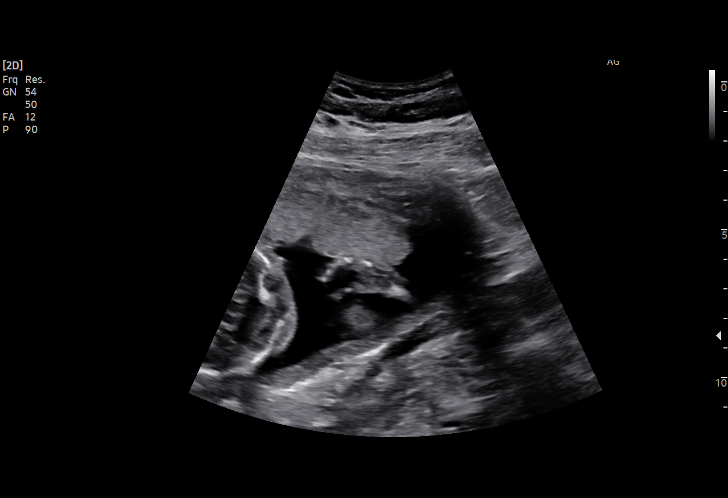
[im 38/66]
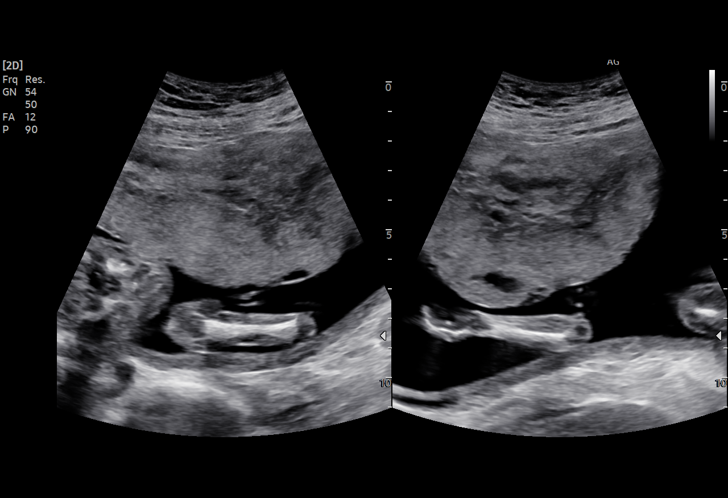
[im 43/66]
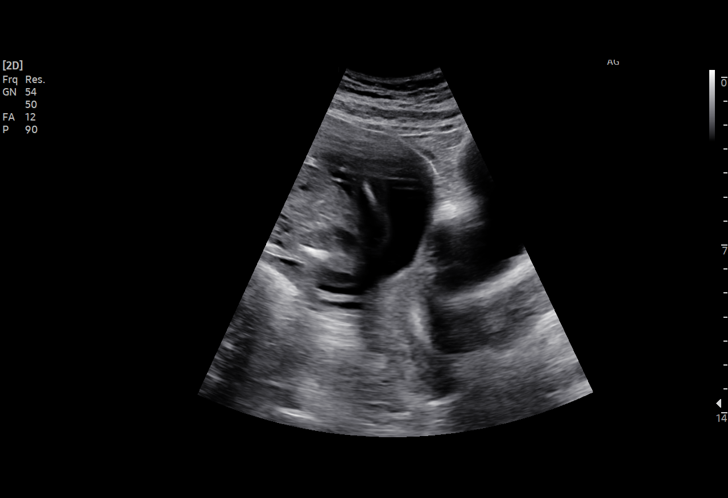
[im 48/66]
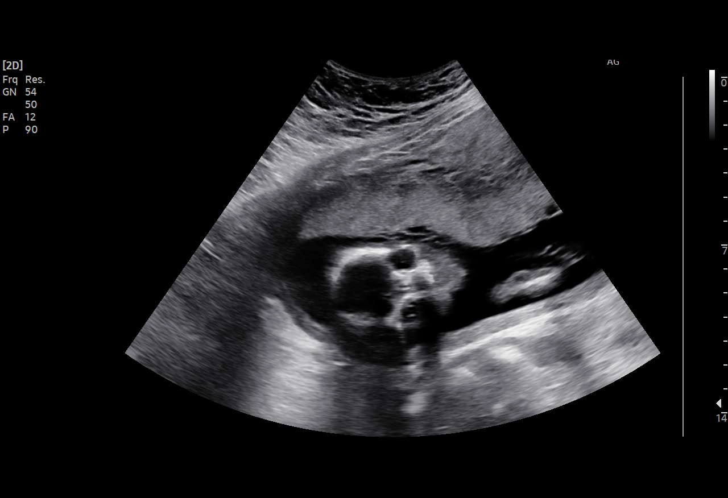
[im 53/66]
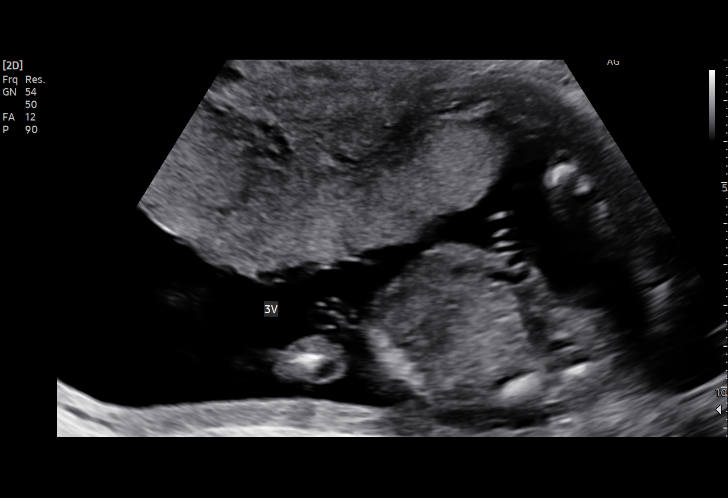
[im 58/66]
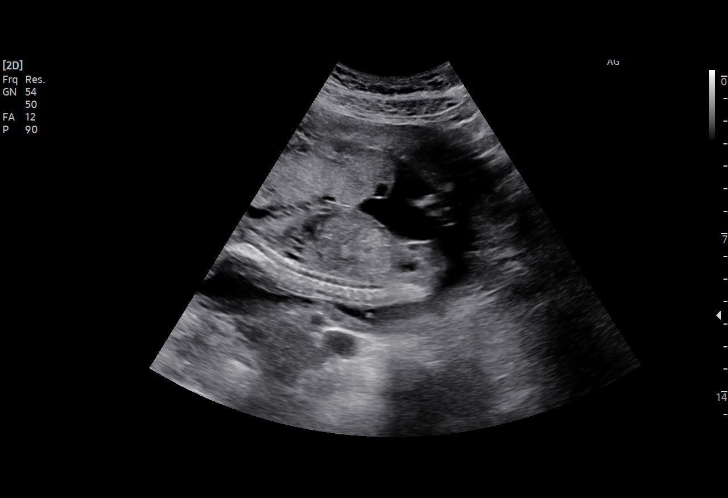
[im 63/66]
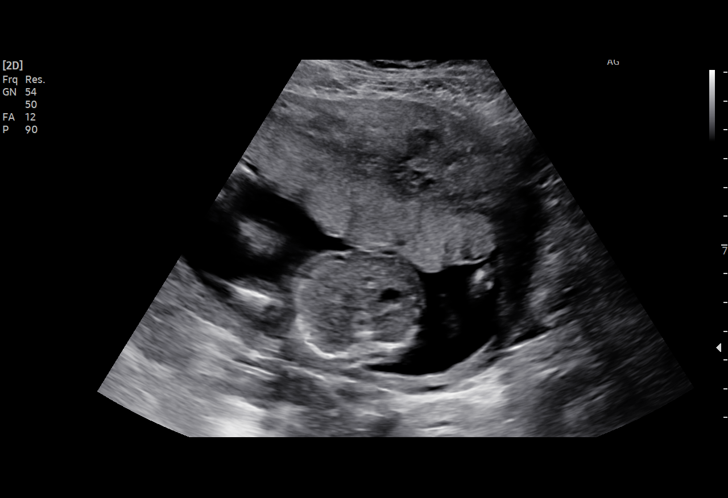

[Series 3: us ob comp + 14 wk · 1 of 1 slices shown (2 of 2)]
[im 1/1]
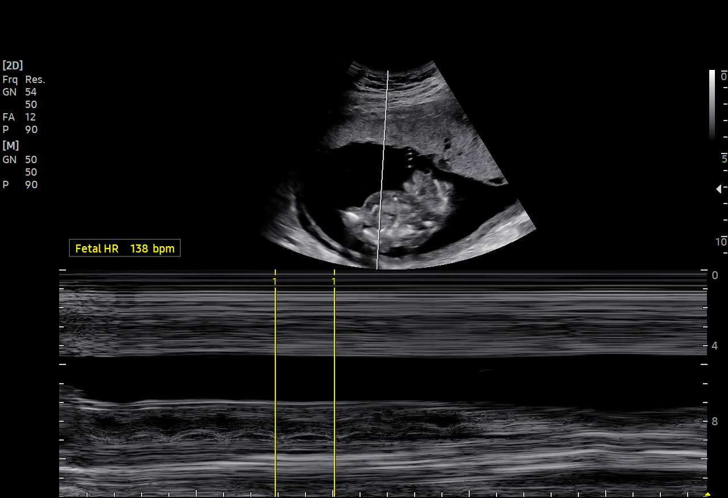

[15 of 28 positions shown; findings below may reference images not displayed]

FINDINGS: Number of Fetuses: 1

Heart Rate:  138 bpm

Movement: Yes

Presentation: Breech

Previa: No

Placental Location: Anterior

Amniotic Fluid (Subjective): Normal

Amniotic Fluid (Objective):

Vertical pocket = 3.7cm

FETAL BIOMETRY

BPD: 4.6cm 20w 0d

HC:   17.4cm 20w 0d

AC:   14.9cm 20w 1d

FL:   3.2cm 19w 6d

Current Mean GA: 20w 1d US EDC: 03/13/2022

Assigned GA:  19w 6d Assigned EDC: 03/15/2022

FETAL ANATOMY

Lateral Ventricles: Appears normal

Thalami/CSP: Appears normal

Posterior Fossa:  Appears normal

Nuchal Region: Appears normal   NFT= 2.1 mm

Upper Lip: Appears normal

Spine: Appears normal

4 Chamber Heart on Left: Appears normal

LVOT: Appears normal

RVOT: Appears normal

Stomach on Left: Appears normal

3 Vessel Cord: Appears normal

Cord Insertion site: Appears normal

Kidneys: Appears normal

Bladder: Appears normal

Extremities: Appears normal

Technically difficult due to: None

Maternal Findings:

Cervix:  4.3 cm
IMPRESSION: 1. Single live intrauterine pregnancy as above, estimated age 20
weeks and 1 day.
2. Normal fetal anatomic survey.

## 2023-09-20 ENCOUNTER — Telehealth: Payer: Self-pay

## 2023-09-20 NOTE — Telephone Encounter (Signed)
Pls call pt to schedule appt for Nexplanon check.  Pt calling triage because she has been having pretty bad pain where nexplanon is. Pain started about two weeks ago and it mainly hurts when moving around. She said she has a high tolerance pain, she hasn't taken anything for it. She can still feel it, doesn't recall hitting it.

## 2023-09-20 NOTE — Telephone Encounter (Signed)
Pt is scheduled with LMD on 09/27/2023 at 11:15 for a Nexplanon check.

## 2023-09-27 ENCOUNTER — Ambulatory Visit: Payer: 59 | Admitting: Licensed Practical Nurse

## 2023-09-27 ENCOUNTER — Encounter: Payer: Self-pay | Admitting: Licensed Practical Nurse

## 2023-09-27 VITALS — BP 112/99 | HR 68 | Wt 195.2 lb

## 2023-09-27 DIAGNOSIS — Z3046 Encounter for surveillance of implantable subdermal contraceptive: Secondary | ICD-10-CM | POA: Diagnosis not present

## 2023-09-27 DIAGNOSIS — Z3040 Encounter for surveillance of contraceptives, unspecified: Secondary | ICD-10-CM

## 2023-09-27 NOTE — Progress Notes (Signed)
SUBJECTIVE: Here for assessment of her Nexplanon  [place] had her Nexplanon inserted 04/24/2022  About 2 weeks ago, she felt the implant shift, when that happened she had a sensation that went from her elbow to her hand. This sensation lasted about 2 weeks, it made it difficult to use her arm, she almost dropped her baby. She no longer has than sensation. She is here today to be sure "everything is ok", she is happy with Nexplanon as her choice for contraception.   OBJECTIVE:  BP (!) 112/99   Pulse 68   Wt 195 lb 3.2 oz (88.5 kg)   BMI 32.48 kg/m  GEB: NAD Left arm: no redness or swelling, Implant palpated in upper arm as expected, slightly tender in that area.   ASSESSMENT: Surveillance of contraceptive device   PLAN: -Reviewed the implant may have shifted and pinched a nerve. At this time there is not cause for concern. Recommend continuing with implant as she is pleased with it. Pt in agreement.   Carie Caddy, CNM  [facility]  09/27/23  11:25 AM

## 2023-10-29 ENCOUNTER — Encounter: Payer: Self-pay | Admitting: Emergency Medicine

## 2023-10-29 ENCOUNTER — Ambulatory Visit
Admission: EM | Admit: 2023-10-29 | Discharge: 2023-10-29 | Disposition: A | Discharge status: Home / Self Care | Payer: 59

## 2023-10-29 DIAGNOSIS — W57XXXA Bitten or stung by nonvenomous insect and other nonvenomous arthropods, initial encounter: Secondary | ICD-10-CM

## 2023-10-29 DIAGNOSIS — L03113 Cellulitis of right upper limb: Secondary | ICD-10-CM

## 2023-10-29 DIAGNOSIS — S50861A Insect bite (nonvenomous) of right forearm, initial encounter: Secondary | ICD-10-CM

## 2023-10-29 MED ORDER — DOXYCYCLINE HYCLATE 100 MG PO CAPS: 100.0000 mg | ORAL_CAPSULE | Freq: Two times a day (BID) | ORAL | 0 refills | Status: AC

## 2023-10-29 MED ORDER — TRIAMCINOLONE ACETONIDE 0.1 % EX CREA: 1.0000 | TOPICAL_CREAM | Freq: Two times a day (BID) | CUTANEOUS | 0 refills | Status: AC

## 2023-10-29 NOTE — Discharge Instructions (Signed)
Take the doxycycline twice daily with food for 7 days for treatment of cellulitis.  You may apply the triamcinolone 0.1% cream twice daily to the area of redness to help with itching.  Continue to monitor the spread of the redness.  If it continues to expand bred out work from your drawn circle, become deeper, become more swollen, start draining pus, red streaks start going up your arm, or you start running fevers then you need to be reevaluated in the emergency department.
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
## 2023-10-29 NOTE — ED Triage Notes (Signed)
Pt presents with a ?insect bite to her right arm yesterday. The area is red and itchy.

## 2023-10-30 ENCOUNTER — Emergency Department
Admission: EM | Admit: 2023-10-30 | Discharge: 2023-10-30 | Disposition: A | Discharge status: Home / Self Care | Payer: 59 | Attending: Emergency Medicine | Admitting: Emergency Medicine

## 2023-10-30 ENCOUNTER — Other Ambulatory Visit: Payer: Self-pay

## 2023-10-30 DIAGNOSIS — L03113 Cellulitis of right upper limb: Secondary | ICD-10-CM | POA: Diagnosis present

## 2023-10-30 MED ORDER — LIDOCAINE HCL (PF) 1 % IJ SOLN
5.0000 mL | Freq: Once | INTRAMUSCULAR | Status: AC
  Administered 2023-10-30: 5 mL via INTRADERMAL
  Filled 2023-10-30: qty 5

## 2023-10-30 MED ORDER — CEFTRIAXONE SODIUM 1 G IJ SOLR
1.0000 g | Freq: Once | INTRAMUSCULAR | Status: AC
  Administered 2023-10-30: 1 g via INTRAMUSCULAR
  Filled 2023-10-30: qty 10

## 2023-10-30 NOTE — ED Provider Notes (Signed)
[facility] Provider Note    Event Date/Time   First MD Initiated Contact with Patient 10/30/23 [phone]     (approximate)   History   Insect Bite   HPI  Sarah Gomez is a 27 y.o. female with history of epilepsy, ADHD, iron deficiency anemia presents emergency department with a red raised area from a spider bite.  Was seen at urgent care yesterday started on doxycycline.  Patient states redness is increased.  No fever or chills.  No drainage from the area.  Unsure of what kind of spider.  No muscle aches or vomiting.     Physical Exam   Triage Vital Signs: ED Triage Vitals  Encounter Vitals Group     BP 10/30/23 0759 127/89     Systolic BP Percentile --      Diastolic BP Percentile --      Pulse Rate 10/30/23 0759 75     Resp 10/30/23 0759 17     Temp 10/30/23 0759 98.2 F (36.8 C)     Temp Source 10/30/23 0759 Oral     SpO2 10/30/23 0759 98 %     Weight 10/30/23 0758 195 lb (88.5 kg)     Height 10/30/23 0758 5\' 5"  (1.651 m)     Head Circumference --      Peak Flow --      Pain Score 10/30/23 0757 0     Pain Loc --      Pain Education --      Exclude from Growth Chart --     Most recent vital signs: Vitals:   10/30/23 0759  BP: 127/89  Pulse: 75  Resp: 17  Temp: 98.2 F (36.8 C)  SpO2: 98%     General: Awake, no distress.   CV:  Good peripheral perfusion. regular rate and  rhythm Resp:  Normal effort.  Abd:  No distention.   Other:  Right forearm with red raised area, 2 circles noted, 1 is from yesterday patient drew another 1 today has extended the redness.  Area is tender to palpation.  No pus noted.  No drainage.  No necrotic areas   ED Results / Procedures / Treatments   Labs (all labs ordered are listed, but only abnormal results are displayed) Labs Reviewed - No data to display   EKG     RADIOLOGY     PROCEDURES:   Procedures   MEDICATIONS ORDERED IN ED: Medications  cefTRIAXone (ROCEPHIN) injection  1 g (1 g Intramuscular Given 10/30/23 0842)  lidocaine (PF) (XYLOCAINE) 1 % injection 5 mL (5 mLs Intradermal Given 10/30/23 0842)     IMPRESSION / MDM / ASSESSMENT AND PLAN / ED COURSE  I reviewed the triage vital signs and the nursing notes.                              Differential diagnosis includes, but is not limited to, cellulitis, spider bite, necrotizing fasciitis  Patient's presentation is most consistent with acute complicated illness / injury requiring diagnostic workup.   Physical exam shows area to be a little warm and tender.  Will go ahead and give her a shot of Rocephin while here in the ED to assist in healing the cellulitis quickly.  However she can continue her doxycycline.  Follow-up with her regular doctor if not improving to 3 days.  Return if worsening.  Patient is in agreement treatment plan.  Discharged stable condition.      FINAL CLINICAL IMPRESSION(S) / ED DIAGNOSES   Final diagnoses:  Cellulitis of right upper extremity     Rx / DC Orders   ED Discharge Orders     None        Note:  This document was prepared using Dragon voice recognition software and may include unintentional dictation errors.    Faythe Ghee, PA-C 10/30/23 4098    Trinna Post, MD 10/30/23 1537

## 2023-10-30 NOTE — ED Notes (Signed)
See triage note  Presents with possible spider bite to right posterior forearm  Was seen and placed on doxy couple of days ago  Area is red and swollen  Afebrile

## 2023-10-30 NOTE — ED Triage Notes (Signed)
Pt comes with c/o spider bite. Pt states this started Sunday night. Pt states she has went to UC and has now noticed it has gotten bigger. Pt states area is red and swollen.

## 2023-11-17 IMAGING — US US OB FOLLOW-UP
1 series · 15 of 28 positions shown · non-contrast
Comparison: 10/25/2021

CLINICAL DATA: Evaluate fetal growth.

EXAM:
OBSTETRIC 14+ WK ULTRASOUND FOLLOW-UP

[Series 1: us ob follow up · 15 of 51 slices shown]
[im 1/51]
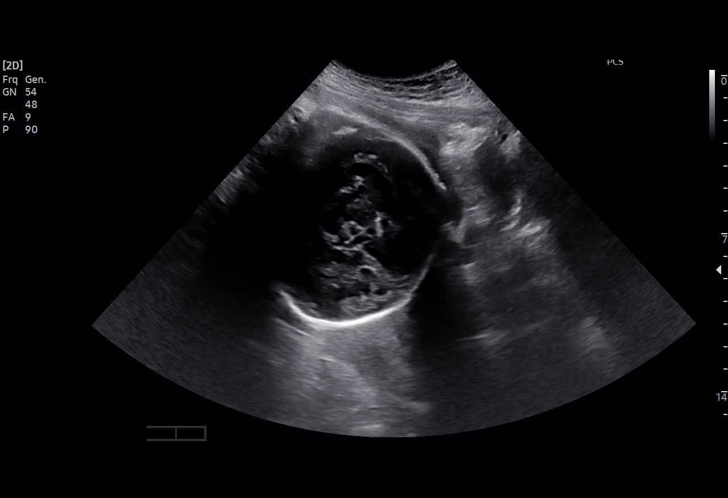
[im 4/51]
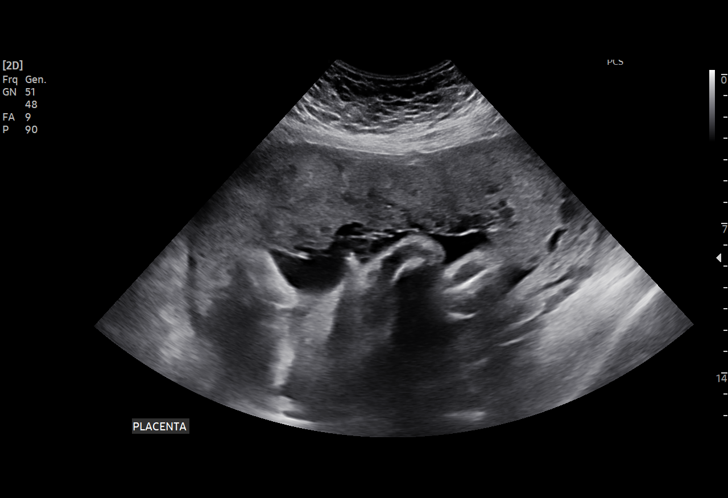
[im 8/51]
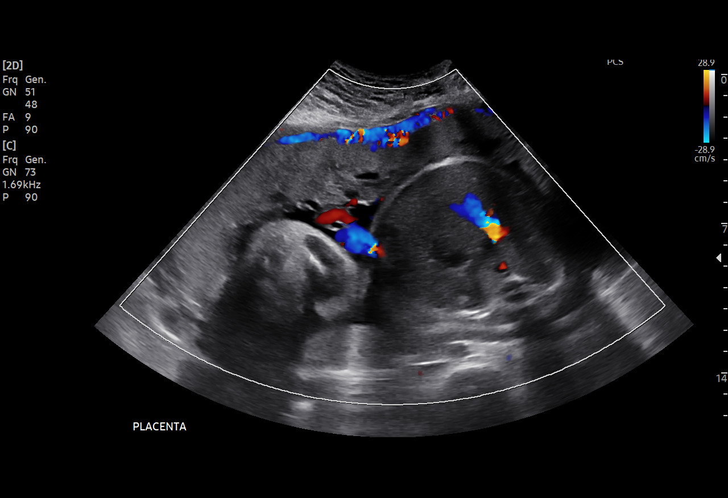
[im 12/51]
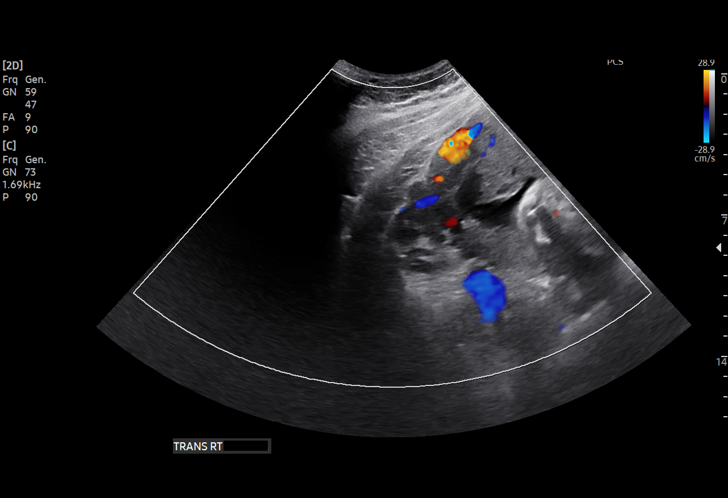
[im 15/51]
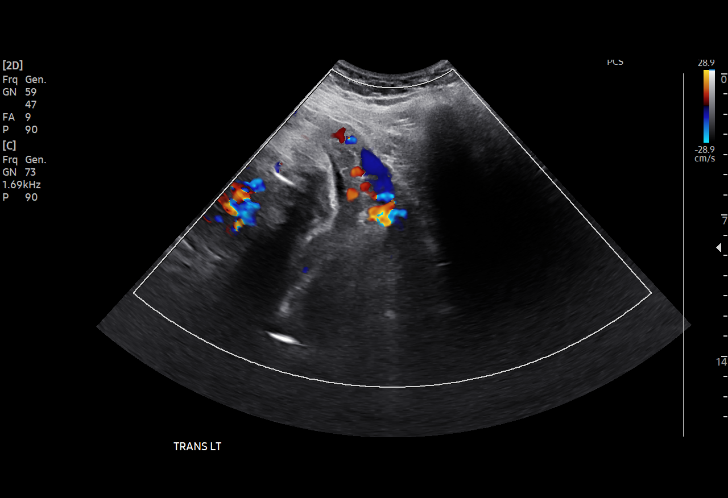
[im 19/51]
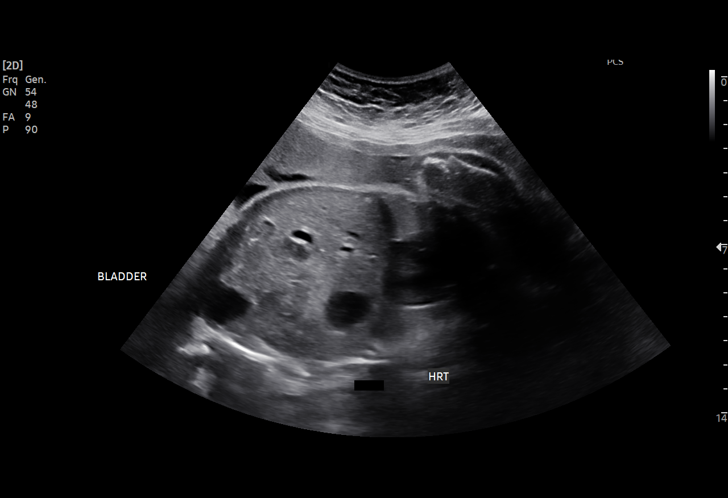
[im 23/51]
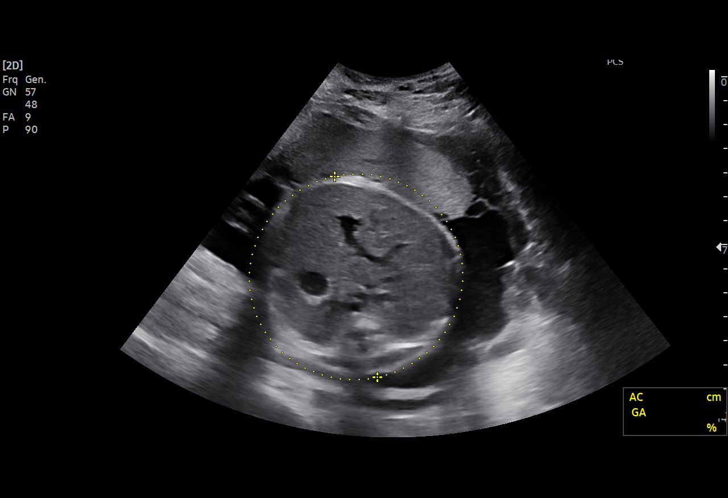
[im 26/51]
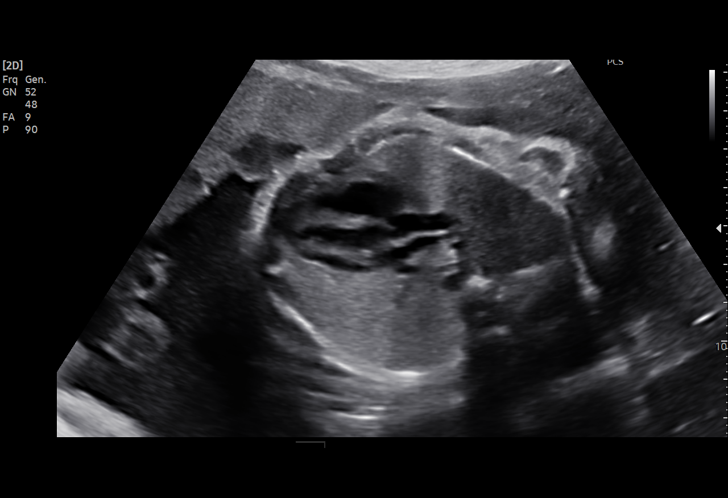
[im 28/51]
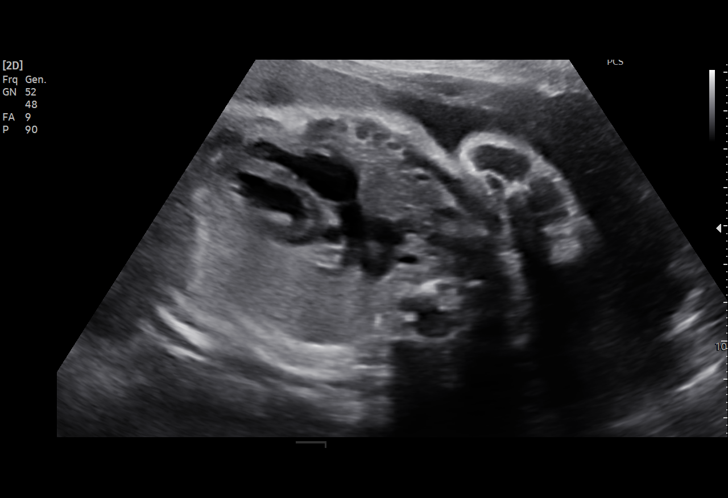
[im 32/51]
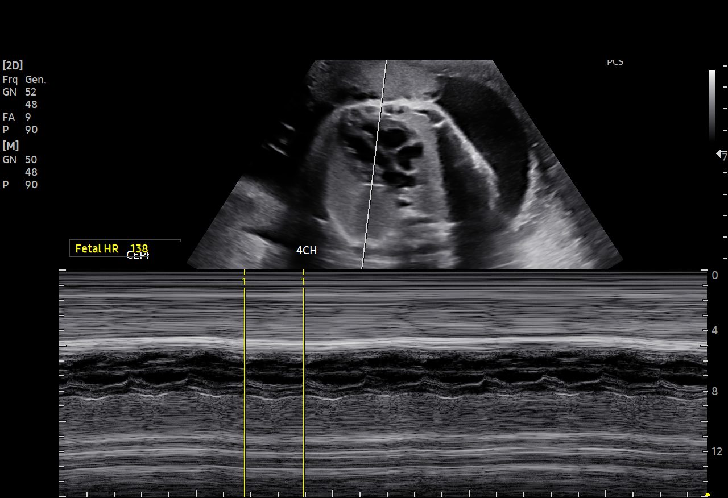
[im 36/51]
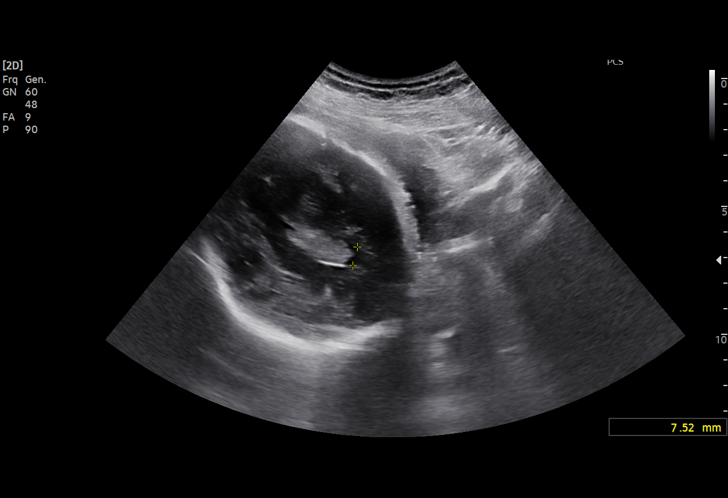
[im 39/51]
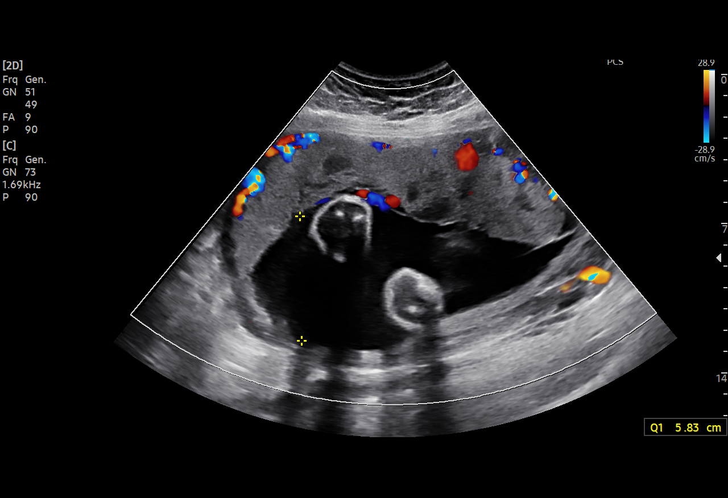
[im 43/51]
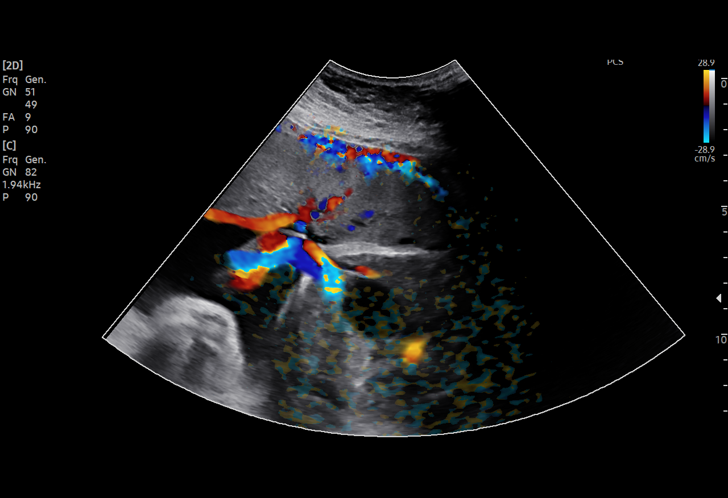
[im 47/51]
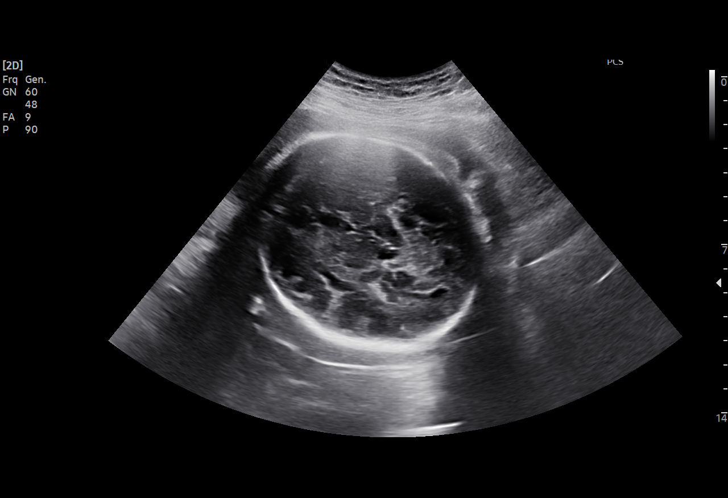
[im 51/51]
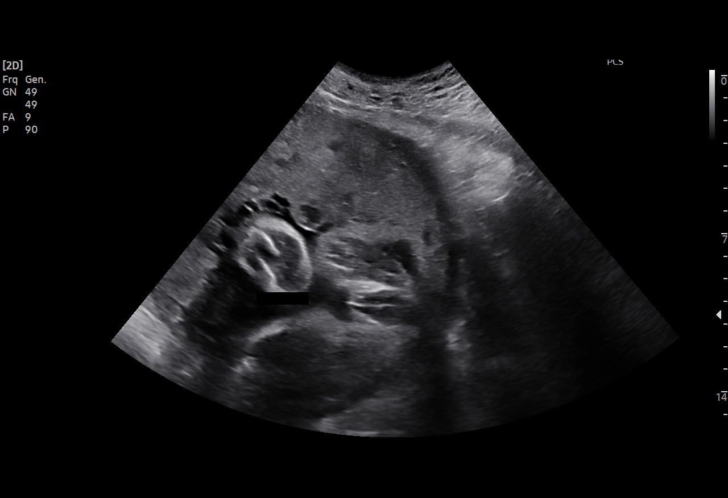

[15 of 28 positions shown; findings below may reference images not displayed]

FINDINGS: Number of Fetuses: 1

Heart Rate:  138 bpm

Movement: Yes

Presentation: Cephalic

Previa: No

Placental Location: Anterior

Amniotic Fluid (Subjective): Normal

Amniotic Fluid (Objective):

AFI = 10.6 cm

FETAL BIOMETRY

BPD: 8.1cm 32w 4d

HC:   29.8cm 33w 2d

AC:   28.1cm 32w 1d

FL:   6.3cm 32w 4d

Current Mean GA: 32w 4d US EDC: 03/11/2022

Assigned GA:  32w 0d Assigned EDC: 03/15/2022

Estimated Fetal Weight:  1,965g 61%ile

FETAL ANATOMY

Lateral Ventricles: Appears normal

Thalami/CSP: Appears normal

Posterior Fossa:  Previously seen

Nuchal Region: Previously seen

Upper Lip: Appears normal

Spine: Appears normal

4 Chamber Heart on Left: Appears normal

LVOT: Appears normal

RVOT: Appears normal

Stomach on Left: Appears normal

3 Vessel Cord: Appears normal

Cord Insertion site: Appears normal

Kidneys: Appears normal

Bladder: Appears normal

Extremities: Previously seen

Technically difficult due to: None

Maternal Findings:

Cervix: 3.7 cm. Closed.
IMPRESSION: 1. Single live intrauterine pregnancy as detailed above.

## 2023-12-28 IMAGING — US US OB FOLLOW-UP
1 series · 14 of 28 positions shown · non-contrast
Comparison: none

CLINICAL DATA: BMI greater than 35, assess fetal growth

EXAM:
OBSTETRICAL ULTRASOUND >14 WKS

[Series 1: us ob follow up · 14 of 28 slices shown]
[im 2/28]
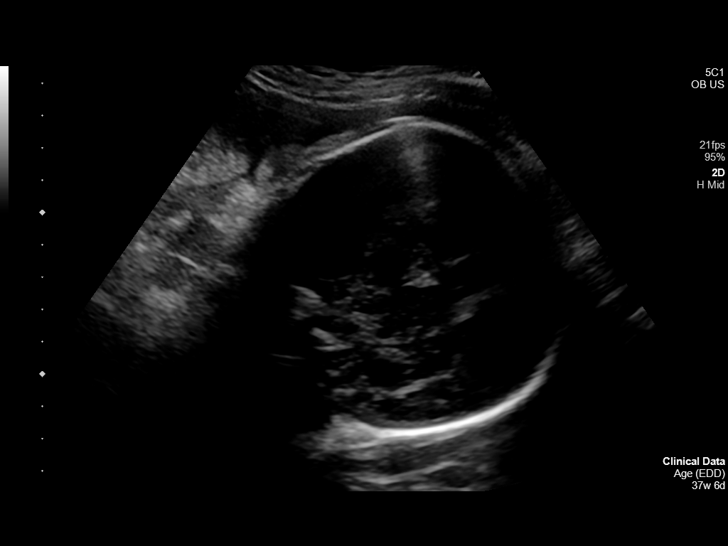
[im 4/28]
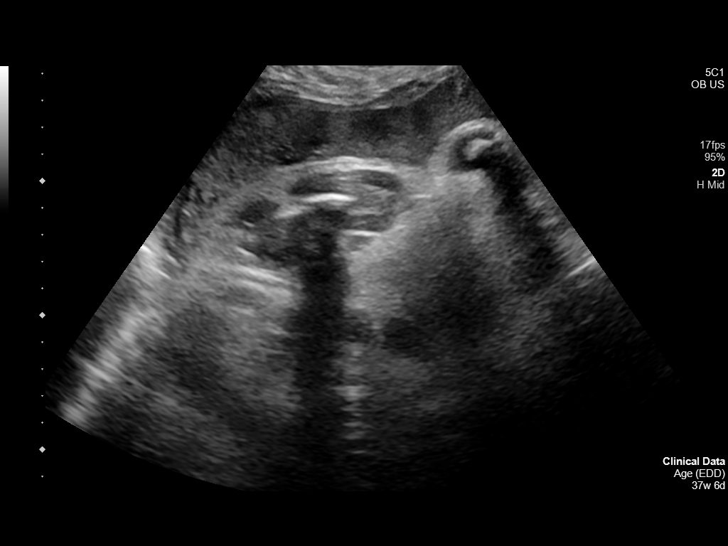
[im 6/28]
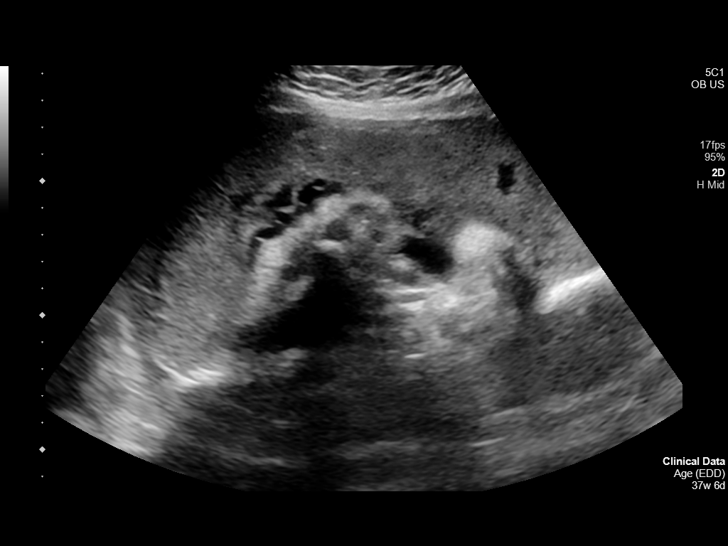
[im 8/28]
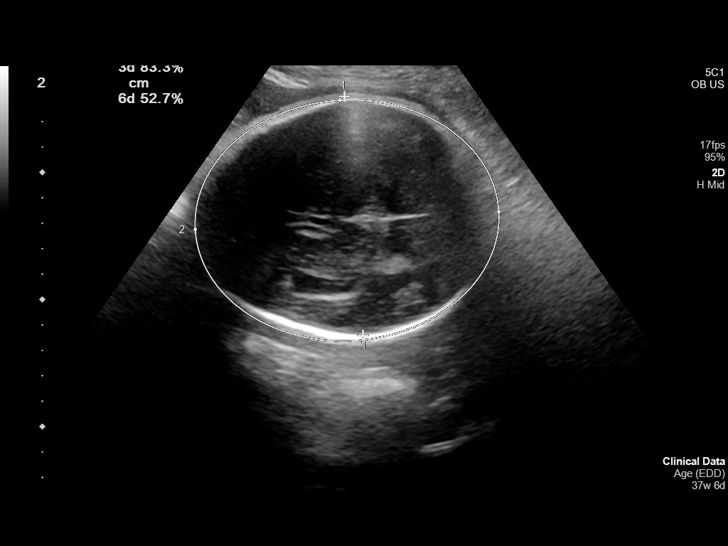
[im 10/28]
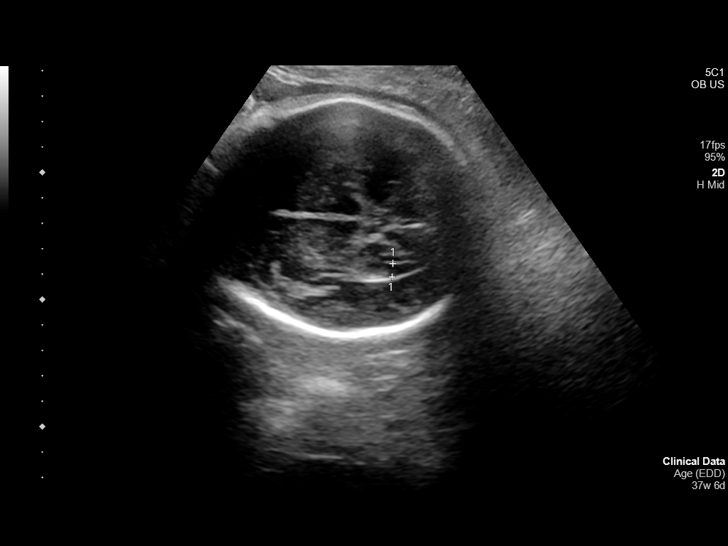
[im 12/28]
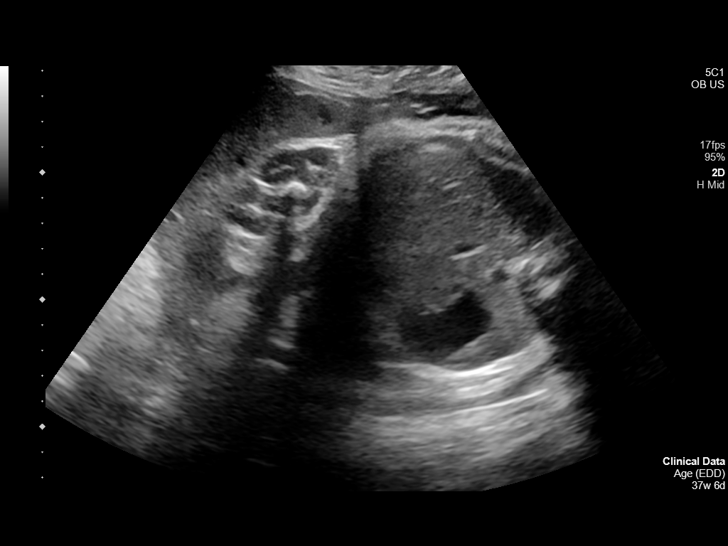
[im 14/28]
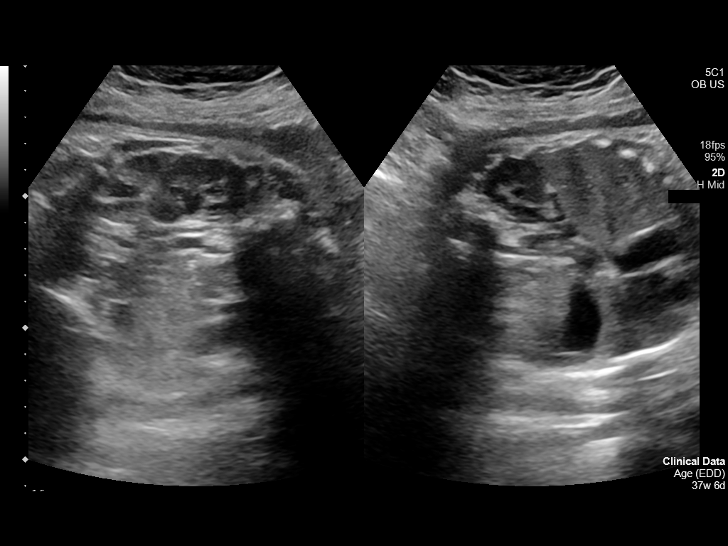
[im 16/28]
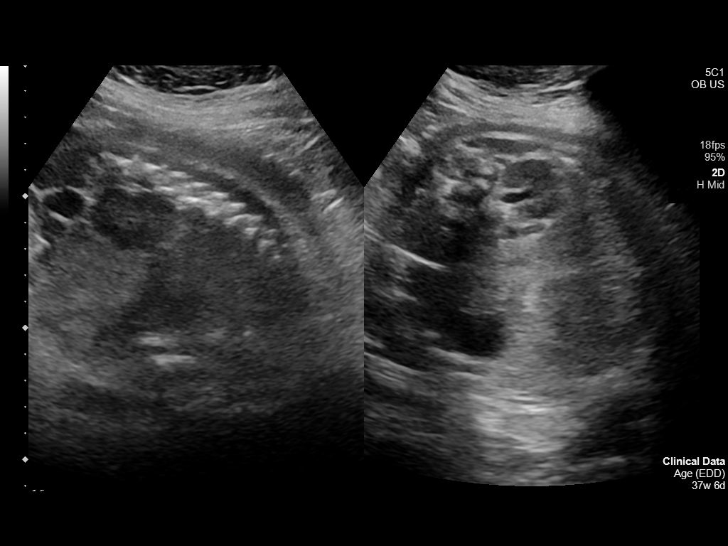
[im 18/28]
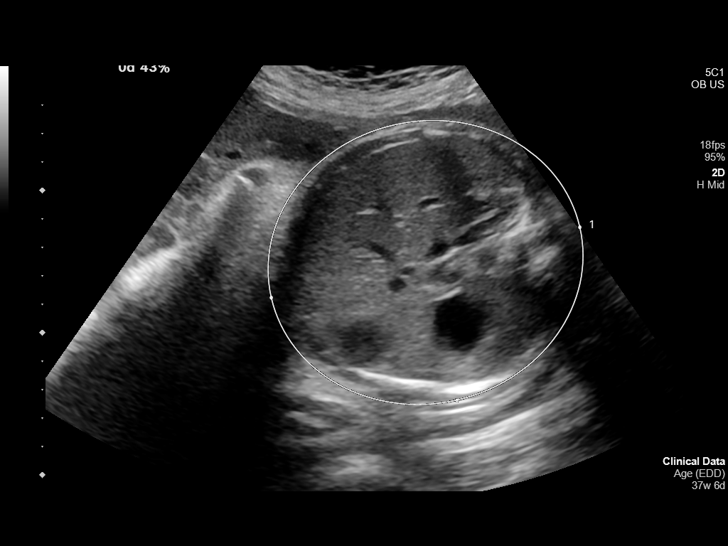
[im 20/28]
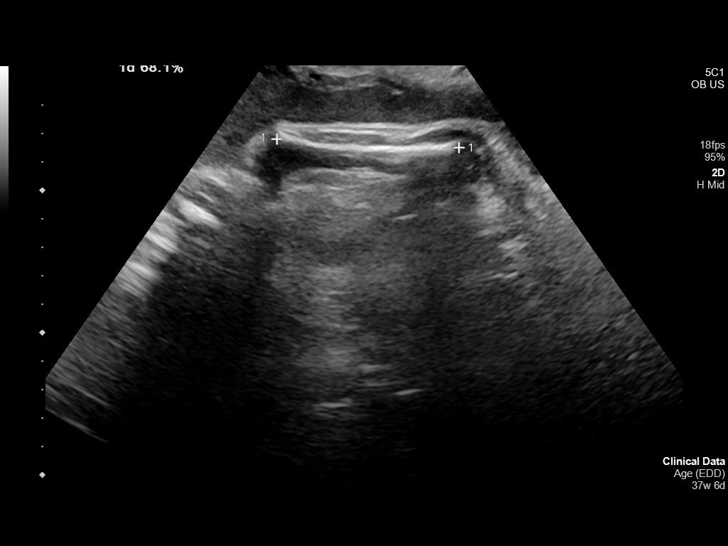
[im 22/28]
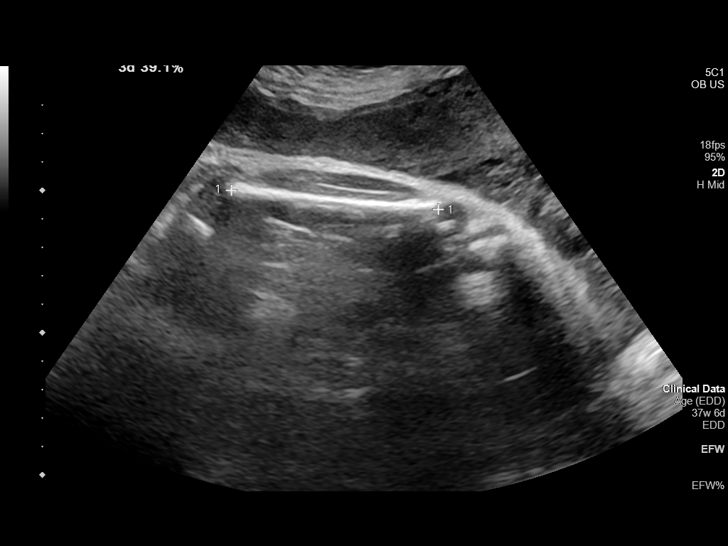
[im 24/28]
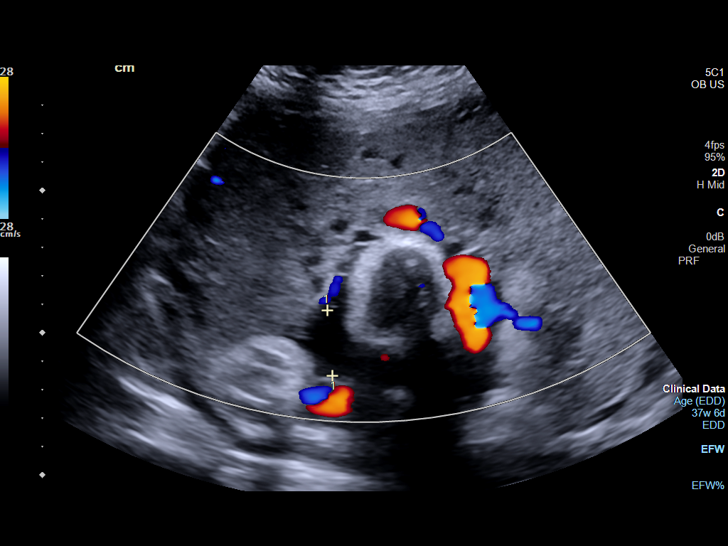
[im 26/28]
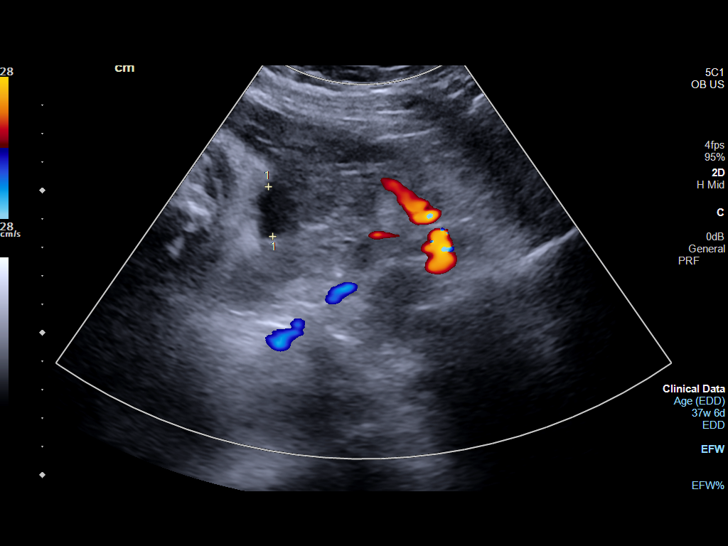
[im 28/28]
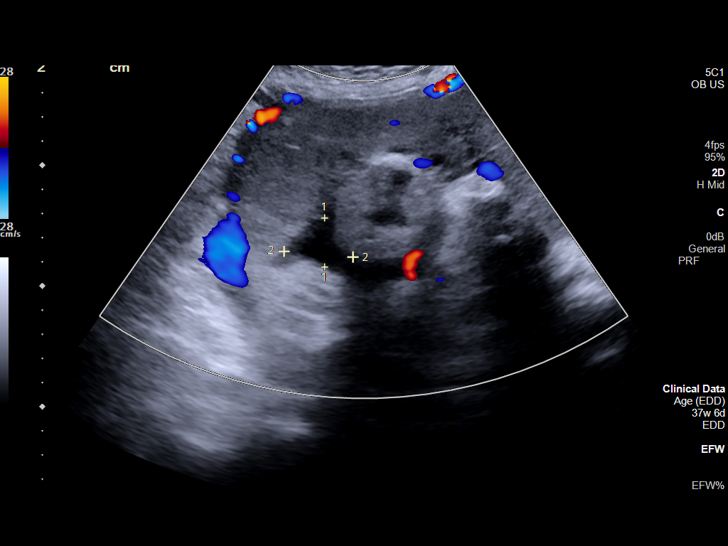

[14 of 28 positions shown; findings below may reference images not displayed]

FINDINGS: Number of Fetuses: 1

Heart Rate:  164 bpm

Movement: Yes

Presentation: Cephalic

Previa: No

Placental Location: Anterior

Amniotic Fluid (Subjective): Decreased

Amniotic Fluid (Objective):

AFI = 7.1 cm (5%ile= 7.5 cm, 95%= 24.4 cm for 37 wks)

FETAL BIOMETRY

BPD: 9.4cm 38w 1d

HC:   33.9cm 39w 0d

AC:   33.4cm 37w 2d

FL:   7.3cm 37w 4d

Current Mean GA: 37w 5d US EDC: 03/16/2022

Assigned GA:  37w 6d Assigned EDC: 03/15/2022

Estimated Fetal Weight: 3,277g 56.8%ile, previously at the
percentile

FETAL ANATOMY

The fetal anatomic survey was completed previously. On today's exam,
the kidneys, bladder, stomach, and lateral ventricles are well
visualized and unremarkable.

Technically difficult due to: Late gestational age

Maternal Findings:

Cervix:  Not evaluated
IMPRESSION: 1. Single live intrauterine pregnancy as above, estimated age 37
weeks and 5 days.
2. Estimated fetal weight at the 56.8 percentile, previously at the
61.3 percentile.

## 2024-01-07 ENCOUNTER — Encounter: Payer: Self-pay | Admitting: Oncology

## 2024-02-02 DIAGNOSIS — Z1371 Encounter for nonprocreative screening for genetic disease carrier status: Secondary | ICD-10-CM

## 2024-02-02 DIAGNOSIS — Z803 Family history of malignant neoplasm of breast: Secondary | ICD-10-CM

## 2024-02-02 DIAGNOSIS — Z800 Family history of malignant neoplasm of digestive organs: Secondary | ICD-10-CM

## 2024-02-02 HISTORY — DX: Family history of malignant neoplasm of breast: Z80.3

## 2024-02-02 HISTORY — DX: Encounter for nonprocreative screening for genetic disease carrier status: Z13.71

## 2024-02-02 HISTORY — DX: Family history of malignant neoplasm of digestive organs: Z80.0

## 2024-02-17 NOTE — Progress Notes (Unsigned)
 [name], PA-C [address]  [address]  [place], [place] [address]  Main: [phone]  Fax: [phone]   Primary Care Physician: Sandrea Hughs, NP  Primary Gastroenterologist:  [name], PA-C / Dr. Wyline Mood    CC: F/U rectal bleeding, chronic constipation, abdominal pain   HPI: Sarah Gomez is a 28 y.o. female returns for follow-up of rectal bleeding, chronic constipation, and generalized lower abdominal pain.  Thought to have irritable bowel syndrome, constipation predominant.  She is not currently taking any treatment for constipation or IBS.  She noticed having episode of rectal bleeding 6 weeks ago after she had hard bowel movement with straining.  Typically has bowel movement every 4 days.  She reports having GI symptoms since childhood.  Avoids milk and dairy which helps.  She continues to have constipation and intermittent lower abdominal pain.  She denies any upper GI symptoms.  Cholecystectomy 04/2022.  No prior EGD. 08/2023 labs normal CBC, CMP, lipase.  Hemoglobin 14.5.   Colonoscopy 07/04/2023 by Dr. Tobi Bastos showed 1 small 5 mm tubular adenoma polyp removed from sigmoid colon.  Excellent prep.  No other abnormality.  No mention of hemorrhoids.  Repeat colonoscopy in 7 years.   CT abdomen pelvis with contrast 04/27/2023 showed no acute abnormality. Small umbilical hernia containing fat. Previous cholecystectomy.   Current Outpatient Medications  Medication Sig Dispense Refill   escitalopram (LEXAPRO) 10 MG tablet Take by mouth.     hydrocortisone (ANUSOL-HC) 25 MG suppository Place 1 suppository (25 mg total) rectally at bedtime for 24 days. 12 suppository 1   triamcinolone cream (KENALOG) 0.1 % Apply 1 Application topically 2 (two) times daily. 30 g 0   sucralfate (CARAFATE) 1 g tablet Take 1 tablet (1 g total) by mouth 4 (four) times daily for 15 days. 60 tablet 0   No current facility-administered medications for this visit.    Allergies as of  02/18/2024   (No Known Allergies)    Past Medical History:  Diagnosis Date   ADHD    Anxiety    Depression    Epilepsy (HCC)    Iron deficiency anemia 12/02/2019   Migraine    Mood disorder (HCC)    Seizures (HCC)     Past Surgical History:  Procedure Laterality Date   CHOLECYSTECTOMY  04/03/2022   COLONOSCOPY WITH PROPOFOL N/A 07/04/2023   Procedure: COLONOSCOPY WITH PROPOFOL;  Surgeon: Wyline Mood, MD;  Location: [facility] ENDOSCOPY;  Service: Gastroenterology;  Laterality: N/A;   NO PAST SURGERIES     POLYPECTOMY  07/04/2023   Procedure: POLYPECTOMY;  Surgeon: Wyline Mood, MD;  Location: [facility] ENDOSCOPY;  Service: Gastroenterology;;    Review of Systems:    All systems reviewed and negative except where noted in HPI.   Physical Examination:   BP 135/78   Pulse 80   Temp 98.6 F (37 C)   Ht 5\' 5"  (1.651 m)   Wt 183 lb 3.2 oz (83.1 kg)   LMP 02/03/2024   BMI 30.49 kg/m   General: Well-nourished, well-developed in no acute distress.  Lungs: Clear to auscultation bilaterally. Non-labored. Heart: Regular rate and rhythm, no murmurs rubs or gallops.  Abdomen: Bowel sounds are normal; Abdomen is Soft; No hepatosplenomegaly, masses or hernias;  Mild bilatreal lower Abdominal Tenderness; No upper abdominal tenderness; No guarding or rebound tenderness. Neuro: Alert and oriented x 3.  Grossly intact.  Psych: Alert and cooperative, normal mood and affect.   Imaging Studies: No results found.  Assessment and Plan:   Sarah Gomez is a 28 y.o. y/o female returns for follow-up of:  1.  Chronic constipation  Discussed constipation treatment at length. Recommend High Fiber diet with fruits, vegetables, and whole grains. Drink 64 ounces of Fluids Daily. Start Miralax Mix 1 capful in a drink daily. If no improvement, consider Linzess, Amitiza or Trulance.   2.  Irritable bowel syndrome, constipation predominant  Start MiraLAX daily.  If MiraLAX does not work well, then  consider Linzess, Trulance, or Amitiza.  Stressed the importance of taking treatment for constipation every day.  3.  Blood in stool (last episode 6 weeks ago) suspect hemorrhoid bleeding from constipation.  Rx Hydrocortisone suppository 25 Mg 1 into the rectum once daily at bedtime for 12 days as needed.  Treatment for hemorrhoids discussed.  3.  History of adenomatous colon polyp  7-year repeat colonoscopy will be due 06/2030.  [name], PA-C  Follow up if symptoms worsen or fail to improve.

## 2024-02-18 ENCOUNTER — Other Ambulatory Visit (HOSPITAL_COMMUNITY)
Admission: RE | Admit: 2024-02-18 | Discharge: 2024-02-18 | Disposition: A | Discharge status: Home / Self Care | Source: Ambulatory Visit | Attending: Advanced Practice Midwife | Admitting: Advanced Practice Midwife

## 2024-02-18 ENCOUNTER — Ambulatory Visit (INDEPENDENT_AMBULATORY_CARE_PROVIDER_SITE_OTHER): Admitting: Advanced Practice Midwife

## 2024-02-18 ENCOUNTER — Ambulatory Visit (INDEPENDENT_AMBULATORY_CARE_PROVIDER_SITE_OTHER): Payer: 59 | Admitting: Physician Assistant

## 2024-02-18 ENCOUNTER — Encounter: Payer: Self-pay | Admitting: Physician Assistant

## 2024-02-18 ENCOUNTER — Encounter: Payer: Self-pay | Admitting: Advanced Practice Midwife

## 2024-02-18 VITALS — BP 126/71 | HR 82 | Ht 65.0 in | Wt 184.0 lb

## 2024-02-18 VITALS — BP 135/78 | HR 80 | Temp 98.6°F | Ht 65.0 in | Wt 183.2 lb

## 2024-02-18 DIAGNOSIS — Z113 Encounter for screening for infections with a predominantly sexual mode of transmission: Secondary | ICD-10-CM | POA: Diagnosis present

## 2024-02-18 DIAGNOSIS — Z124 Encounter for screening for malignant neoplasm of cervix: Secondary | ICD-10-CM | POA: Diagnosis present

## 2024-02-18 DIAGNOSIS — Z01419 Encounter for gynecological examination (general) (routine) without abnormal findings: Secondary | ICD-10-CM | POA: Insufficient documentation

## 2024-02-18 DIAGNOSIS — K581 Irritable bowel syndrome with constipation: Secondary | ICD-10-CM

## 2024-02-18 DIAGNOSIS — K5904 Chronic idiopathic constipation: Secondary | ICD-10-CM

## 2024-02-18 DIAGNOSIS — K649 Unspecified hemorrhoids: Secondary | ICD-10-CM

## 2024-02-18 DIAGNOSIS — Z860101 Personal history of adenomatous and serrated colon polyps: Secondary | ICD-10-CM | POA: Diagnosis not present

## 2024-02-18 DIAGNOSIS — R195 Other fecal abnormalities: Secondary | ICD-10-CM

## 2024-02-18 MED ORDER — POLYETHYLENE GLYCOL 3350 17 GM/SCOOP PO POWD: 17.0000 g | Freq: Every day | ORAL | Status: AC

## 2024-02-18 MED ORDER — HYDROCORTISONE ACETATE 25 MG RE SUPP: 25.0000 mg | Freq: Every day | RECTAL | 1 refills | Status: AC
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
## 2024-02-18 NOTE — Patient Instructions (Signed)
   For constipation: Start OTC Miralax Powder Mix 1 capful in 6 to 8 ounces of a drink once daily  Recommend high-fiber diet, 30 g of fiber daily Eat fruits, vegetables, and whole grains Drink 64 ounces of water / fluids daily.

## 2024-02-20 LAB — CYTOLOGY - PAP
Chlamydia: NEGATIVE
Comment: NEGATIVE
Comment: NEGATIVE
Comment: NORMAL
Diagnosis: NEGATIVE
Neisseria Gonorrhea: NEGATIVE
Trichomonas: NEGATIVE

## 2024-02-22 ENCOUNTER — Encounter: Payer: Self-pay | Admitting: Advanced Practice Midwife

## 2024-02-28 ENCOUNTER — Encounter: Payer: Self-pay | Admitting: Obstetrics and Gynecology

## 2024-03-16 ENCOUNTER — Encounter: Payer: Self-pay | Admitting: Advanced Practice Midwife

## 2024-03-20 IMAGING — US US ABDOMEN LIMITED
1 series · 14 of 25 positions shown · non-contrast
Comparison: None Available.

CLINICAL DATA: 2 month history of epigastric pain.

EXAM:
ULTRASOUND ABDOMEN LIMITED RIGHT UPPER QUADRANT

[Series 1: us abdomen limited ruq (liver/gb) · 14 of 43 slices shown]
[im 1/43]
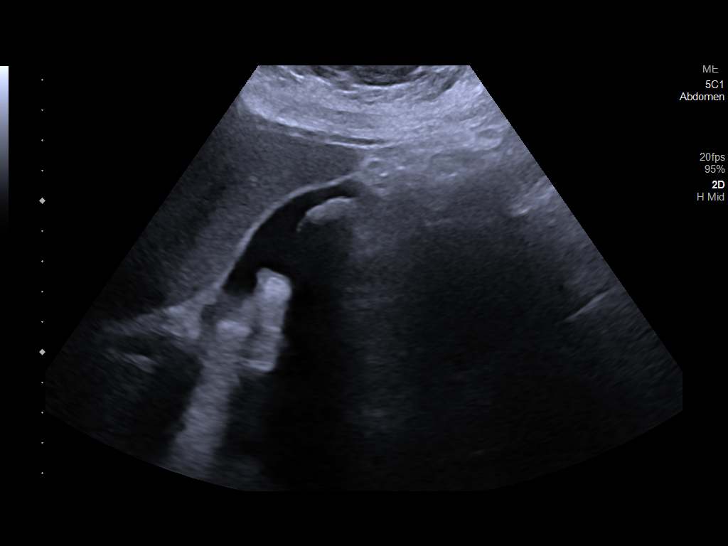
[im 4/43]
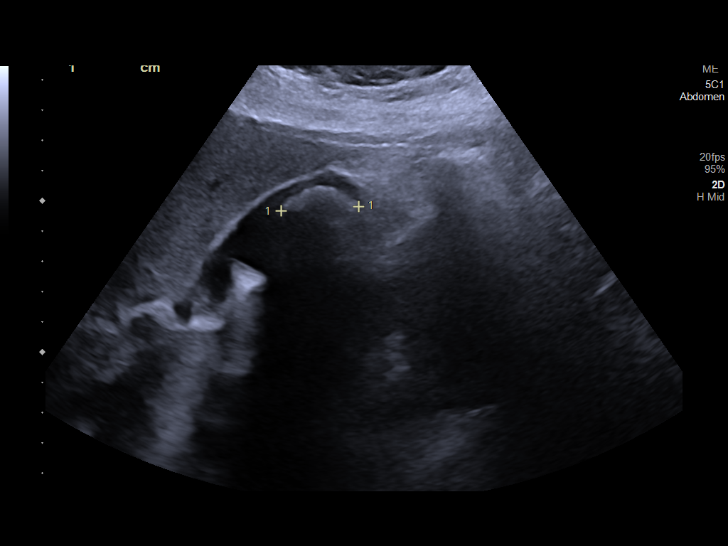
[im 8/43]
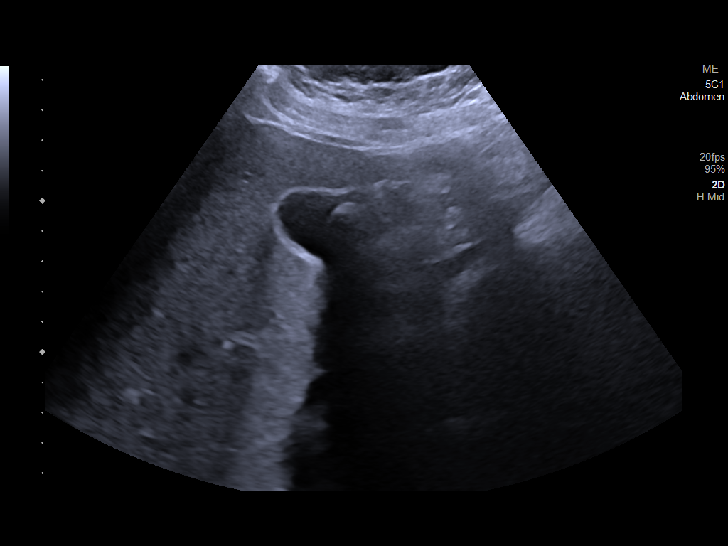
[im 11/43]
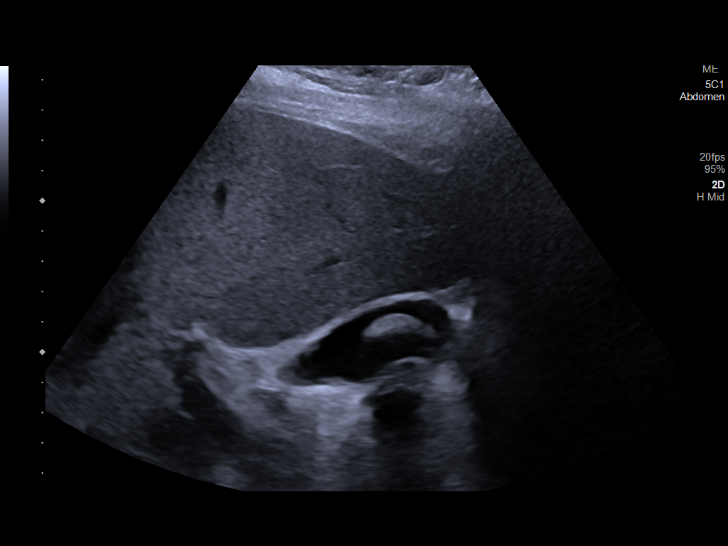
[im 15/43]
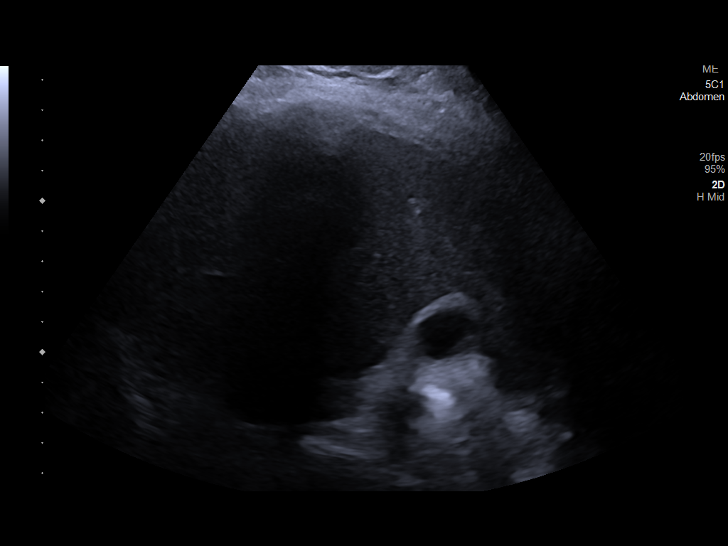
[im 16/43]
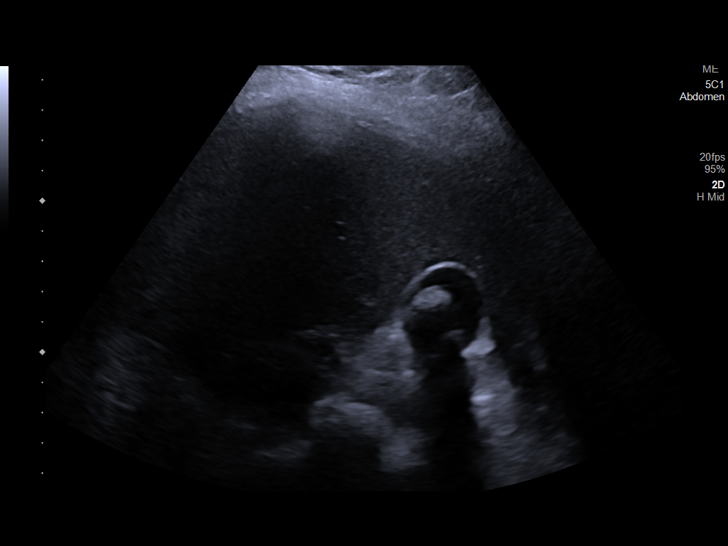
[im 20/43]
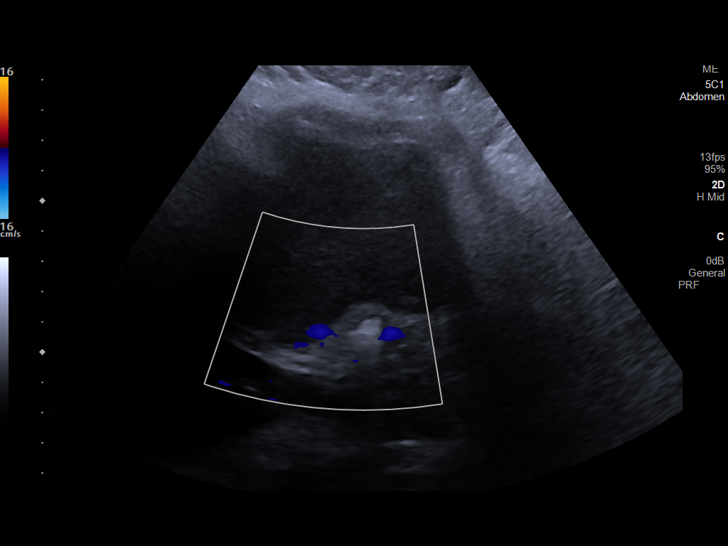
[im 23/43]
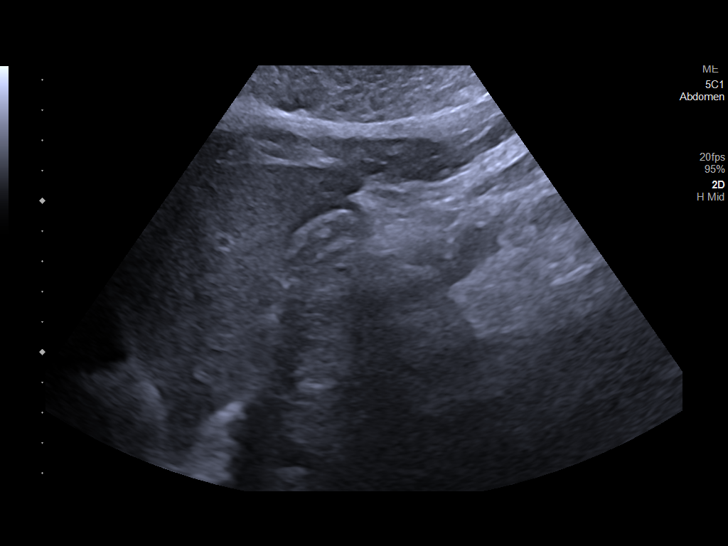
[im 27/43]
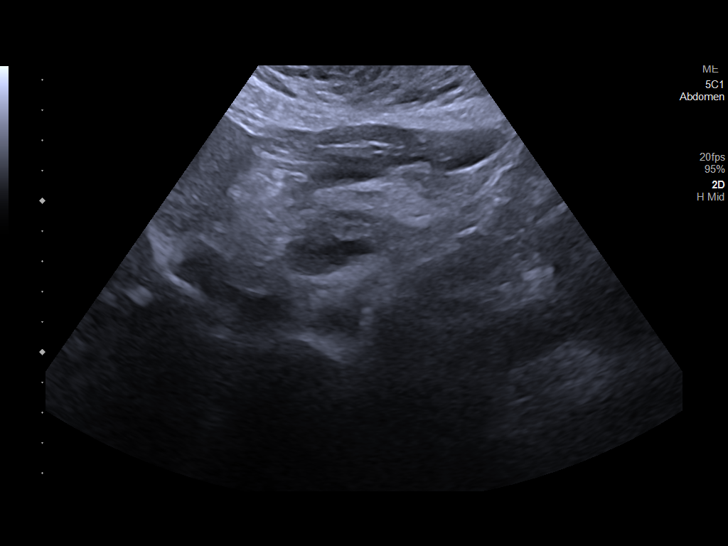
[im 29/43]
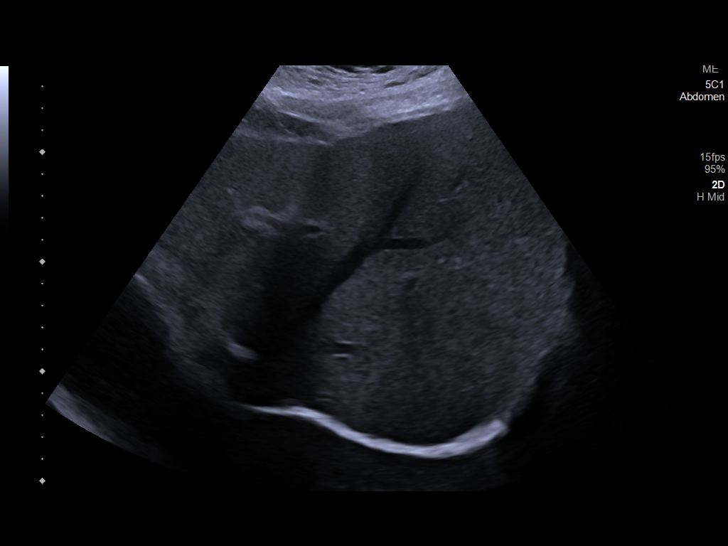
[im 32/43]
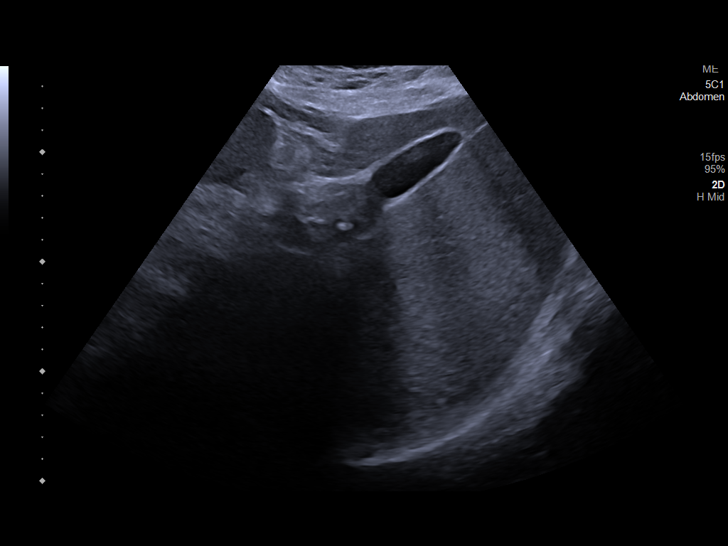
[im 36/43]
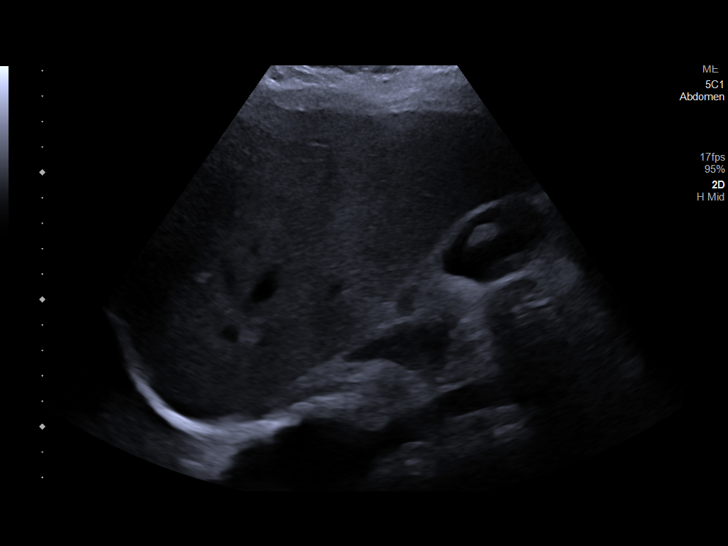
[im 39/43]
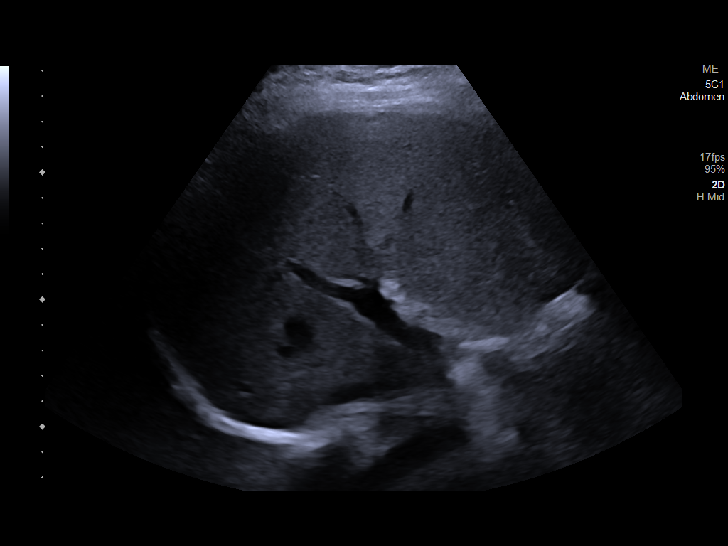
[im 43/43]
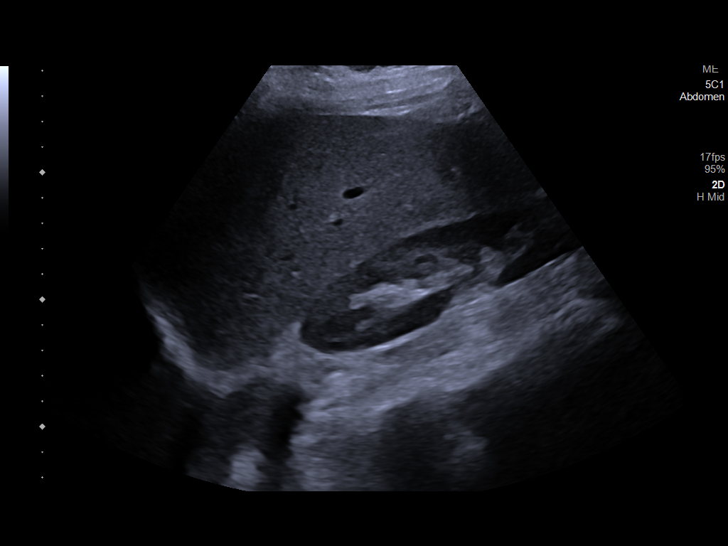

[14 of 25 positions shown; findings below may reference images not displayed]

FINDINGS: Gallbladder:

Multiple gallstones evident measuring up to 2.6 cm. No gallbladder
wall thickening or pericholecystic fluid. Sonographer reports no
sonographic Murphy sign.

Common bile duct:

Diameter: 4 mm

Liver:

No focal lesion identified. Within normal limits in parenchymal
echogenicity. Portal vein is patent on color Doppler imaging with
normal direction of blood flow towards the liver.

Other: None.
IMPRESSION: Cholelithiasis without gallbladder wall thickening or
pericholecystic fluid. No biliary dilatation.

## 2024-06-03 ENCOUNTER — Ambulatory Visit (INDEPENDENT_AMBULATORY_CARE_PROVIDER_SITE_OTHER)

## 2024-06-03 ENCOUNTER — Ambulatory Visit
Admission: EM | Admit: 2024-06-03 | Discharge: 2024-06-03 | Disposition: A | Discharge status: Home / Self Care | Attending: Emergency Medicine | Admitting: Emergency Medicine

## 2024-06-03 DIAGNOSIS — M546 Pain in thoracic spine: Secondary | ICD-10-CM | POA: Diagnosis present

## 2024-06-03 DIAGNOSIS — B9789 Other viral agents as the cause of diseases classified elsewhere: Secondary | ICD-10-CM | POA: Insufficient documentation

## 2024-06-03 DIAGNOSIS — J988 Other specified respiratory disorders: Secondary | ICD-10-CM | POA: Insufficient documentation

## 2024-06-03 LAB — RESP PANEL BY RT-PCR (FLU A&B, COVID) ARPGX2
Influenza A by PCR: NEGATIVE
Influenza B by PCR: NEGATIVE
SARS Coronavirus 2 by RT PCR: NEGATIVE

## 2024-06-03 LAB — URINALYSIS, ROUTINE W REFLEX MICROSCOPIC
Glucose, UA: NEGATIVE mg/dL
Ketones, ur: >160 mg/dL — AB
Leukocytes,Ua: NEGATIVE
Nitrite: NEGATIVE
Specific Gravity, Urine: 1.025 (ref 1.005–1.030)
pH: 7 (ref 5.0–8.0)

## 2024-06-03 LAB — URINALYSIS, MICROSCOPIC (REFLEX)

## 2024-06-03 MED ORDER — PREDNISONE 20 MG PO TABS: 40.0000 mg | ORAL_TABLET | Freq: Every day | ORAL | 0 refills | Status: AC

## 2024-06-03 MED ORDER — AEROCHAMBER MV MISC: 1 refills | Status: AC

## 2024-06-03 MED ORDER — ALBUTEROL SULFATE HFA 108 (90 BASE) MCG/ACT IN AERS
2.0000 | INHALATION_SPRAY | RESPIRATORY_TRACT | 0 refills | Status: AC | PRN
Start: 2024-06-03 — End: ?

## 2024-06-03 MED ORDER — ACETAMINOPHEN 325 MG PO TABS
975.0000 mg | ORAL_TABLET | Freq: Once | ORAL | Status: AC
Start: 2024-06-03 — End: 2024-06-03
  Administered 2024-06-03: 975 mg via ORAL

## 2024-06-03 MED ORDER — IBUPROFEN 600 MG PO TABS: 600.0000 mg | ORAL_TABLET | Freq: Three times a day (TID) | ORAL | 0 refills | Status: AC | PRN

## 2024-06-03 MED ORDER — KETOROLAC TROMETHAMINE 30 MG/ML IJ SOLN
30.0000 mg | Freq: Once | INTRAMUSCULAR | Status: AC
Start: 2024-06-03 — End: 2024-06-03
  Administered 2024-06-03: 30 mg via INTRAMUSCULAR

## 2024-06-03 MED ORDER — PROMETHAZINE-DM 6.25-15 MG/5ML PO SYRP: 5.0000 mL | ORAL_SOLUTION | Freq: Four times a day (QID) | ORAL | 0 refills | Status: AC | PRN

## 2024-06-03 MED ORDER — FLUTICASONE PROPIONATE 50 MCG/ACT NA SUSP: 2.0000 | Freq: Every day | NASAL | 0 refills | Status: AC

## 2024-06-03 NOTE — ED Provider Notes (Signed)
 HPI  SUBJECTIVE:  Sarah Gomez is a 28 y.o. female who presents with stabbing, burning, constant left back pain starting in her lower back, and going to the top of her chest starting today.  She reports body aches, nasal congestion, productive cough.  She has had this cough for months.  Sputum has not changed in color or amount recently.  She states the cough has been getting worse over the past few days, and becoming more intense.  She reports wheezing, fevers/chills, headaches.  No shortness of breath at rest, change in her baseline dyspnea on exertion.  No change in physical activity, repetitive movement or lifting.  No sinus pain or pressure, facial swelling, upper dental pain.  No nausea, vomiting, diarrhea, abdominal pain.  She is sleeping through the night without waking up coughing.  No known COVID, flu exposure.  She had 2 doses of COVID-vaccine and last year's flu vaccine.  No GERD symptoms.  She does report some allergy symptoms which got worse today.  She has not tried anything for her symptoms.  Symptoms are worse with taking a deep breath and standing straight up.  It is not associated with torso rotation.  She has a past medical history of epilepsy, but has not been on any antiepileptics since 2021, and sinus issues/year-round allergies.  Has not been taking her Zyrtec recently.  No history of asthma, PE, DVT, OCP use, recent immobilization, calf pain or swelling, surgery in the past 4 weeks.  No history of MI, coronary disease, diabetes, hypertension, hypercholesterolemia, CVA, PAD/PVD.  Family history negative for early MI.  LMP: 2 weeks ago.  Denies possibility of being pregnant.  [facility]   Past Medical History:  Diagnosis Date   ADHD    Anxiety    BRCA negative 02/2024   IBIS=16.5%/riskscore=14.7%   Depression    Epilepsy (HCC)    Family history of breast cancer 02/2024   IBIS=16.5%/riskscore=14.7%   Family history of colon cancer 02/2024   Iron  deficiency  anemia 12/02/2019   Migraine    Mood disorder (HCC)    Seizures (HCC)     Past Surgical History:  Procedure Laterality Date   CHOLECYSTECTOMY  04/03/2022   COLONOSCOPY WITH PROPOFOL  N/A 07/04/2023   Procedure: COLONOSCOPY WITH PROPOFOL ;  Surgeon: Therisa Bi, MD;  Location: [facility] ENDOSCOPY;  Service: Gastroenterology;  Laterality: N/A;   POLYPECTOMY  07/04/2023   Procedure: POLYPECTOMY;  Surgeon: Therisa Bi, MD;  Location: [facility] ENDOSCOPY;  Service: Gastroenterology;;    Family History  Problem Relation Age of Onset   Multiple sclerosis Mother    Hypertension Father    Heart Problems Sister    Breast cancer Maternal Aunt 32   Breast cancer Maternal Grandmother 41   Colon cancer Paternal Grandmother 25   Colon cancer Paternal Grandfather        unknown age    Social History   Tobacco Use   Smoking status: Former   Smokeless tobacco: Never  Vaping Use   Vaping status: Every Day  Substance Use Topics   Alcohol use: Not Currently   Drug use: Not Currently    Types: Marijuana    No current facility-administered medications for this encounter.  Current Outpatient Medications:    albuterol  (VENTOLIN  HFA) 108 (90 Base) MCG/ACT inhaler, Inhale 2 puffs into the lungs every 4 (four) hours as needed for wheezing or shortness of breath., Disp: 1 each, Rfl: 0   fluticasone  (FLONASE ) 50 MCG/ACT nasal spray, Place 2 sprays into  both nostrils daily., Disp: 16 g, Rfl: 0   ibuprofen  (ADVIL ) 600 MG tablet, Take 1 tablet (600 mg total) by mouth every 8 (eight) hours as needed., Disp: 30 tablet, Rfl: 0   predniSONE  (DELTASONE ) 20 MG tablet, Take 2 tablets (40 mg total) by mouth daily with breakfast for 5 days., Disp: 10 tablet, Rfl: 0   promethazine -dextromethorphan (PROMETHAZINE -DM) 6.25-15 MG/5ML syrup, Take 5 mLs by mouth 4 (four) times daily as needed for cough., Disp: 118 mL, Rfl: 0   Spacer/Aero-Holding Chambers (AEROCHAMBER MV) inhaler, Use as instructed, Disp: 1 each, Rfl: 1    escitalopram (LEXAPRO) 10 MG tablet, Take by mouth., Disp: , Rfl:    etonogestrel  (NEXPLANON ) 68 MG IMPL implant, 1 each by Subdermal route once., Disp: , Rfl:    polyethylene glycol powder (GLYCOLAX /MIRALAX ) 17 GM/SCOOP powder, Take 17 g by mouth daily., Disp: , Rfl:    sucralfate  (CARAFATE ) 1 g tablet, Take 1 tablet (1 g total) by mouth 4 (four) times daily for 15 days., Disp: 60 tablet, Rfl: 0   triamcinolone  cream (KENALOG ) 0.1 %, Apply 1 Application topically 2 (two) times daily., Disp: 30 g, Rfl: 0  No Known Allergies   ROS  As noted in HPI.   Physical Exam  BP 130/83 (BP Location: Left Arm)   Pulse 82   Temp 100 F (37.8 C) (Oral)   Resp 16   Ht 5' 5 (1.651 m)   Wt 82.1 kg   SpO2 100%   BMI 30.12 kg/m   Constitutional: Well developed, well nourished, crying due to pain Eyes: PERRL, EOMI, conjunctiva normal bilaterally HENT: Normocephalic, atraumatic,mucus membranes moist.  Positive nasal congestion.  Normal turbinates.  No maxillary, frontal sinus tenderness.  Normal oropharynx. Neck: Shotty cervical lymphadenopathy Respiratory: Clear to auscultation bilaterally, no rales, no wheezing, no rhonchi. Cardiovascular: Normal rate and rhythm, no murmurs, no gallops, no rubs GI: nondistended, soft, nontender, active bowel sounds.  No rebound, guarding.  No palpable pulsatile masses. Back: No C-spine, T-spine tenderness.  No CVAT.  No tenderness over the left thoracic region anteriorly or posteriorly. skin: No rash, skin intact Musculoskeletal: Calves symmetric, nontender, no no edema Neurologic: Alert & oriented x 3, CN III-XII grossly intact, no motor deficits, sensation grossly intact Psychiatric: Speech and behavior appropriate   ED Course   Medications  acetaminophen  (TYLENOL ) tablet 975 mg (975 mg Oral Given 06/03/24 1846)  ketorolac  (TORADOL ) 30 MG/ML injection 30 mg (30 mg Intramuscular Given 06/03/24 1845)    Orders Placed This Encounter  Procedures   Resp Panel  by RT-PCR (Flu A&B, Covid) Anterior Nasal Swab    Standing Status:   Standing    Number of Occurrences:   1   DG Chest 2 View    Standing Status:   Standing    Number of Occurrences:   1    Reason for Exam (SYMPTOM  OR DIAGNOSIS REQUIRED):   Cough, wheeze, left sided thoracic pleuritic pain.  Rule out pneumonia, pneumothorax, pleural effusion   Urinalysis, Routine w reflex microscopic -Urine, Clean Catch    Standing Status:   Standing    Number of Occurrences:   1    Specimen Source:   Urine, Clean Catch [[id number]]   Urinalysis, Microscopic (reflex)    Standing Status:   Standing    Number of Occurrences:   1   Results for orders placed or performed during the hospital encounter of 06/03/24 (from the past 24 hours)  Resp Panel by RT-PCR (Flu A&B, Covid)  Anterior Nasal Swab     Status: None   Collection Time: 06/03/24  5:35 PM   Specimen: Anterior Nasal Swab  Result Value Ref Range   SARS Coronavirus 2 by RT PCR NEGATIVE NEGATIVE   Influenza A by PCR NEGATIVE NEGATIVE   Influenza B by PCR NEGATIVE NEGATIVE  Urinalysis, Routine w reflex microscopic -Urine, Clean Catch     Status: Abnormal   Collection Time: 06/03/24  6:48 PM  Result Value Ref Range   Color, Urine YELLOW YELLOW   APPearance HAZY (A) CLEAR   Specific Gravity, Urine 1.025 1.005 - 1.030   pH 7.0 5.0 - 8.0   Glucose, UA NEGATIVE NEGATIVE mg/dL   Hgb urine dipstick TRACE (A) NEGATIVE   Bilirubin Urine SMALL (A) NEGATIVE   Ketones, ur >160 (A) NEGATIVE mg/dL   Protein, ur TRACE (A) NEGATIVE mg/dL   Nitrite NEGATIVE NEGATIVE   Leukocytes,Ua NEGATIVE NEGATIVE  Urinalysis, Microscopic (reflex)     Status: Abnormal   Collection Time: 06/03/24  6:48 PM  Result Value Ref Range   RBC / HPF 6-10 0 - 5 RBC/hpf   WBC, UA 0-5 0 - 5 WBC/hpf   Bacteria, UA FEW (A) NONE SEEN   Squamous Epithelial / HPF 0-5 0 - 5 /HPF   Mucus PRESENT    DG Chest 2 View Result Date: 06/03/2024 CLINICAL DATA:  Cough. EXAM: CHEST - 2 VIEW COMPARISON:   Chest radiograph dated 11/07/2022. FINDINGS: The heart size and mediastinal contours are within normal limits. Both lungs are clear. The visualized skeletal structures are unremarkable. IMPRESSION: No active cardiopulmonary disease. Electronically Signed   By: Vanetta Chou M.D.   On: 06/03/2024 19:08    ED Clinical Impression  1. Acute left-sided thoracic back pain   2. Viral respiratory infection      ED Assessment/Plan     Giving 30 mg of Toradol  IM and 975 mg of Tylenol  p.o. here for thoracic pain.  COVID and flu are negative.  Discussed this with patient while in department.  Will check chest x-ray to evaluate for pneumothorax, pleural effusion, pneumonia and a UA due to the body aches although she has no urinary complaints.  She declined pregnancy testing.  Reviewed imaging independently.  No effusion, pneumothorax, normal mediastinum.  Increased peribronchial markings on the lateral view as read by me. formal radiology overread pending.  Will contact patient [phone] if radiology overread differs enough from mine and we need to change management.  Reviewed radiology report.  No acute cardiopulmonary disease.  See radiology report for full details.  UA with trace hematuria, many ketones proteinuria.  No UTI.  Reevaluation, patient states she feels better.  Presentation consistent with a bronchitis/respiratory infection, and pleuritic chest pain.  Patient is PERC negative.  doubt PE.  Given the small amount of hematuria, nephrolithiasis in the differential, however, she has no CVA, flank, suprapubic tenderness.   Home with Flonase , saline nasal irrigation, Tylenol /ibuprofen , Mucinex D, prednisone  40 mg for 5 days, albuterol  inhaler with a spacer 2 puffs every 4-6 hours, Promethazine  DM.  Follow-up with PCP.  ER return precautions given.  Discussed labs, imaging, MDM, treatment plan, and plan for follow-up with patient Discussed sn/sx that should prompt return to the ED.  patient agrees with plan.   Meds ordered this encounter  Medications   acetaminophen  (TYLENOL ) tablet 975 mg   ketorolac  (TORADOL ) 30 MG/ML injection 30 mg   fluticasone  (FLONASE ) 50 MCG/ACT nasal spray    Sig: Place 2 sprays  into both nostrils daily.    Dispense:  16 g    Refill:  0   Spacer/Aero-Holding Chambers (AEROCHAMBER MV) inhaler    Sig: Use as instructed    Dispense:  1 each    Refill:  1   albuterol  (VENTOLIN  HFA) 108 (90 Base) MCG/ACT inhaler    Sig: Inhale 2 puffs into the lungs every 4 (four) hours as needed for wheezing or shortness of breath.    Dispense:  1 each    Refill:  0   promethazine -dextromethorphan (PROMETHAZINE -DM) 6.25-15 MG/5ML syrup    Sig: Take 5 mLs by mouth 4 (four) times daily as needed for cough.    Dispense:  118 mL    Refill:  0   ibuprofen  (ADVIL ) 600 MG tablet    Sig: Take 1 tablet (600 mg total) by mouth every 8 (eight) hours as needed.    Dispense:  30 tablet    Refill:  0   predniSONE  (DELTASONE ) 20 MG tablet    Sig: Take 2 tablets (40 mg total) by mouth daily with breakfast for 5 days.    Dispense:  10 tablet    Refill:  0      *This clinic note was created using Scientist, clinical (histocompatibility and immunogenetics). Therefore, there may be occasional mistakes despite careful proofreading. ?    Van Knee, MD 06/05/24 1726

## 2024-06-03 NOTE — ED Triage Notes (Signed)
 Pt c/o fever & chest congestion x2 days. No OTC meds.

## 2024-06-03 NOTE — Discharge Instructions (Addendum)
 I did not appreciate any fluid in your lungs, obvious pneumonia, your mediastinum is normal.  Radiology did not see any problems with your x-ray either.  I am concerned about bronchitis.  I am sending you home with prednisone  40 mg for 5 days, 2 puffs from an albuterol  inhaler using your spacer every 4-6 hours as needed, Mucinex D, 600 mg of ibuprofen  combined with 1000 mg of Tylenol  3-4 times a day, Flonase , saline nasal irrigation with a NeilMed sinus rinse and distilled water  as often as you want.  Follow-up with your primary care provider in several days.  Go immediately to the ER if your pain gets worse, is not controlled with Tylenol /ibuprofen , or for any other concerns.  your urinalysis shows that you are dehydrated, you have a small amount of blood in it, but there is no infection.  I would have this repeated by your PCP in a week.
